# Patient Record
Sex: Female | Born: 1935
Health system: Southern US, Community
[De-identification: ages and names within clinical notes are randomized; demographics above are authoritative.]

## PROBLEM LIST (undated history)

## (undated) DIAGNOSIS — R7303 Prediabetes: Secondary | ICD-10-CM

## (undated) DIAGNOSIS — T4145XA Adverse effect of unspecified anesthetic, initial encounter: Secondary | ICD-10-CM

## (undated) DIAGNOSIS — R519 Headache, unspecified: Secondary | ICD-10-CM

## (undated) DIAGNOSIS — M109 Gout, unspecified: Secondary | ICD-10-CM

## (undated) DIAGNOSIS — Z972 Presence of dental prosthetic device (complete) (partial): Secondary | ICD-10-CM

## (undated) DIAGNOSIS — G7100 Muscular dystrophy, unspecified: Secondary | ICD-10-CM

## (undated) DIAGNOSIS — E785 Hyperlipidemia, unspecified: Secondary | ICD-10-CM

## (undated) DIAGNOSIS — J40 Bronchitis, not specified as acute or chronic: Secondary | ICD-10-CM

## (undated) DIAGNOSIS — I10 Essential (primary) hypertension: Secondary | ICD-10-CM

## (undated) DIAGNOSIS — I059 Rheumatic mitral valve disease, unspecified: Secondary | ICD-10-CM

## (undated) DIAGNOSIS — T8859XA Other complications of anesthesia, initial encounter: Secondary | ICD-10-CM

## (undated) DIAGNOSIS — G629 Polyneuropathy, unspecified: Secondary | ICD-10-CM

## (undated) DIAGNOSIS — I341 Nonrheumatic mitral (valve) prolapse: Secondary | ICD-10-CM

## (undated) DIAGNOSIS — I209 Angina pectoris, unspecified: Secondary | ICD-10-CM

## (undated) DIAGNOSIS — Z9181 History of falling: Secondary | ICD-10-CM

## (undated) DIAGNOSIS — M1990 Unspecified osteoarthritis, unspecified site: Secondary | ICD-10-CM

## (undated) HISTORY — PX: CARDIAC CATHETERIZATION: SHX172

## (undated) HISTORY — PX: TONSILLECTOMY: SUR1361

## (undated) HISTORY — PX: OTHER SURGICAL HISTORY: SHX169

## (undated) HISTORY — PX: BACK SURGERY: SHX140

## (undated) HISTORY — PX: EYE SURGERY: SHX253

## (undated) HISTORY — PX: TYMPANOSTOMY TUBE PLACEMENT: SHX32

## (undated) HISTORY — PX: ABDOMINAL HYSTERECTOMY: SHX81

## (undated) HISTORY — PX: CARPAL TUNNEL RELEASE: SHX101

## (undated) HISTORY — PX: BUNIONECTOMY: SHX129

## (undated) HISTORY — PX: BREAST SURGERY: SHX581

---
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
## 2004-08-19 ENCOUNTER — Ambulatory Visit: Payer: Self-pay | Admitting: Internal Medicine

## 2004-09-26 ENCOUNTER — Ambulatory Visit: Payer: Self-pay | Admitting: General Surgery

## 2004-11-13 ENCOUNTER — Ambulatory Visit: Payer: Self-pay | Admitting: Internal Medicine

## 2005-05-12 ENCOUNTER — Ambulatory Visit: Payer: Self-pay | Admitting: Internal Medicine

## 2005-08-22 ENCOUNTER — Ambulatory Visit: Payer: Self-pay | Admitting: Internal Medicine

## 2006-06-18 ENCOUNTER — Ambulatory Visit: Payer: Self-pay | Admitting: Cardiovascular Disease

## 2006-06-18 DIAGNOSIS — Z9889 Other specified postprocedural states: Secondary | ICD-10-CM | POA: Insufficient documentation

## 2006-06-20 ENCOUNTER — Emergency Department: Payer: Self-pay | Admitting: Emergency Medicine

## 2006-07-14 ENCOUNTER — Inpatient Hospital Stay (HOSPITAL_COMMUNITY): Admission: RE | Admit: 2006-07-14 | Discharge: 2006-07-16 | Payer: Self-pay | Admitting: Neurosurgery

## 2007-07-22 ENCOUNTER — Ambulatory Visit: Payer: Self-pay | Admitting: Internal Medicine

## 2008-02-10 DIAGNOSIS — I10 Essential (primary) hypertension: Secondary | ICD-10-CM

## 2008-02-29 ENCOUNTER — Ambulatory Visit: Payer: Self-pay | Admitting: Internal Medicine

## 2008-11-22 ENCOUNTER — Emergency Department: Payer: Self-pay | Admitting: Emergency Medicine

## 2009-03-06 ENCOUNTER — Ambulatory Visit: Payer: Self-pay | Admitting: Internal Medicine

## 2009-04-04 ENCOUNTER — Ambulatory Visit: Payer: Self-pay | Admitting: Gastroenterology

## 2009-04-10 ENCOUNTER — Ambulatory Visit: Payer: Self-pay | Admitting: Internal Medicine

## 2009-07-12 ENCOUNTER — Observation Stay: Payer: Self-pay | Admitting: Internal Medicine

## 2009-10-18 ENCOUNTER — Ambulatory Visit: Payer: Self-pay | Admitting: Internal Medicine

## 2009-11-19 ENCOUNTER — Ambulatory Visit: Payer: Self-pay | Admitting: Rheumatology

## 2009-12-31 ENCOUNTER — Ambulatory Visit: Payer: Self-pay | Admitting: Cardiology

## 2010-06-26 ENCOUNTER — Ambulatory Visit: Payer: Self-pay | Admitting: Internal Medicine

## 2010-12-09 DIAGNOSIS — R7309 Other abnormal glucose: Secondary | ICD-10-CM | POA: Insufficient documentation

## 2010-12-09 DIAGNOSIS — I10 Essential (primary) hypertension: Secondary | ICD-10-CM | POA: Insufficient documentation

## 2010-12-09 DIAGNOSIS — H269 Unspecified cataract: Secondary | ICD-10-CM | POA: Insufficient documentation

## 2010-12-09 DIAGNOSIS — I059 Rheumatic mitral valve disease, unspecified: Secondary | ICD-10-CM | POA: Insufficient documentation

## 2010-12-09 DIAGNOSIS — I341 Nonrheumatic mitral (valve) prolapse: Secondary | ICD-10-CM | POA: Insufficient documentation

## 2010-12-09 DIAGNOSIS — I340 Nonrheumatic mitral (valve) insufficiency: Secondary | ICD-10-CM | POA: Insufficient documentation

## 2010-12-09 DIAGNOSIS — G7109 Other specified muscular dystrophies: Secondary | ICD-10-CM | POA: Insufficient documentation

## 2012-01-06 ENCOUNTER — Ambulatory Visit: Payer: Self-pay | Admitting: Internal Medicine

## 2012-11-24 ENCOUNTER — Ambulatory Visit: Payer: Self-pay | Admitting: Ophthalmology

## 2012-11-24 LAB — POTASSIUM: Potassium: 4.3 mmol/L (ref 3.5–5.1)

## 2012-12-06 ENCOUNTER — Ambulatory Visit: Payer: Self-pay | Admitting: Ophthalmology

## 2012-12-06 HISTORY — PX: CATARACT EXTRACTION: SUR2

## 2013-03-17 ENCOUNTER — Other Ambulatory Visit: Payer: Self-pay | Admitting: Podiatry

## 2013-03-17 MED ORDER — OXYCODONE-ACETAMINOPHEN 5-325 MG PO TABS: ORAL_TABLET | ORAL | Status: DC

## 2013-03-17 MED ORDER — CEPHALEXIN 500 MG PO CAPS: 500.0000 mg | ORAL_CAPSULE | Freq: Three times a day (TID) | ORAL | Status: DC

## 2013-03-17 MED ORDER — PROMETHAZINE HCL 12.5 MG PO TABS: 25.0000 mg | ORAL_TABLET | Freq: Four times a day (QID) | ORAL | Status: DC | PRN

## 2013-03-18 ENCOUNTER — Encounter: Payer: Self-pay | Admitting: Podiatry

## 2013-03-18 DIAGNOSIS — M21619 Bunion of unspecified foot: Secondary | ICD-10-CM

## 2013-03-18 HISTORY — PX: OTHER SURGICAL HISTORY: SHX169

## 2013-03-21 ENCOUNTER — Encounter: Payer: Self-pay | Admitting: *Deleted

## 2013-03-24 ENCOUNTER — Ambulatory Visit (INDEPENDENT_AMBULATORY_CARE_PROVIDER_SITE_OTHER): Payer: 59

## 2013-03-24 ENCOUNTER — Encounter: Payer: Self-pay | Admitting: Podiatry

## 2013-03-24 ENCOUNTER — Ambulatory Visit (INDEPENDENT_AMBULATORY_CARE_PROVIDER_SITE_OTHER): Payer: 59 | Admitting: Podiatry

## 2013-03-24 VITALS — BP 127/78 | HR 65 | Temp 97.0°F | Resp 16 | Ht 67.0 in | Wt 160.0 lb

## 2013-03-24 DIAGNOSIS — M21619 Bunion of unspecified foot: Secondary | ICD-10-CM

## 2013-03-24 DIAGNOSIS — M21622 Bunionette of left foot: Secondary | ICD-10-CM

## 2013-03-24 NOTE — Progress Notes (Signed)
Ms. Byer presents today one week status post fifth metatarsal osteotomy with screw. She denies fever chills nausea vomiting muscle aches or pains. States it is doing quite well. No problems with the Cam Walker.  Objective: Vital signs are stable she is alert and oriented x3. Dry sterile dressing was intact. Once removed demonstrates no erythema no edema cellulitis drainage or odor. Radiographic evaluation demonstrates capital fragment in good position with screw fixation. Sutures are in place margins appear to be well coapted.  Assessment: Well-healing surgical foot left status post fifth metatarsal osteotomy.  Plan: Redressed foot today with a dry sterile compressive dressing she will followup with myself or Dr. Donzetta Kohut in one week.

## 2013-03-31 ENCOUNTER — Encounter: Payer: 59 | Admitting: Podiatrist

## 2013-04-04 ENCOUNTER — Encounter: Payer: 59 | Admitting: Podiatry

## 2013-04-06 ENCOUNTER — Encounter: Payer: Self-pay | Admitting: Podiatry

## 2013-04-06 ENCOUNTER — Ambulatory Visit (INDEPENDENT_AMBULATORY_CARE_PROVIDER_SITE_OTHER): Payer: 59 | Admitting: Podiatry

## 2013-04-06 VITALS — BP 136/79 | HR 64 | Resp 16 | Ht 67.0 in | Wt 160.0 lb

## 2013-04-06 DIAGNOSIS — M205X2 Other deformities of toe(s) (acquired), left foot: Secondary | ICD-10-CM

## 2013-04-06 DIAGNOSIS — M21619 Bunion of unspecified foot: Secondary | ICD-10-CM

## 2013-04-06 NOTE — Progress Notes (Signed)
Diana Sullivan presents today 3 weeks status post fifth metatarsal osteotomy. She states that has a stinging in my toe. And she's concerned about the swelling to the lateral aspect of the foot.  Objective: We have reviewed her past medical history medications and allergies. Vital signs are stable she is alert and oriented x3. She has mild tenderness on palpation of the fifth metatarsal at the surgical site. Margins are well coapted sutures are intact. Sutures were removed today and margins remain well coapted and there is no signs of infection. We reviewed old radiographs as well as her initial postop visit radiograph demonstrating to her that we had a very nice correction.  Assessment: Well-healing surgical fifth metatarsal osteotomy left.  Plan: Will allow her to start getting this wet. I also put her in a compression anklet as well as a Darco shoe. We'll followup with her in 2 weeks for another set of x-rays.

## 2013-04-20 ENCOUNTER — Ambulatory Visit (INDEPENDENT_AMBULATORY_CARE_PROVIDER_SITE_OTHER): Payer: 59

## 2013-04-20 ENCOUNTER — Ambulatory Visit (INDEPENDENT_AMBULATORY_CARE_PROVIDER_SITE_OTHER): Payer: 59 | Admitting: Podiatry

## 2013-04-20 ENCOUNTER — Encounter: Payer: Self-pay | Admitting: Podiatry

## 2013-04-20 VITALS — BP 120/68 | HR 67 | Resp 16 | Ht 67.0 in | Wt 160.0 lb

## 2013-04-20 DIAGNOSIS — M79609 Pain in unspecified limb: Secondary | ICD-10-CM

## 2013-04-20 DIAGNOSIS — M79672 Pain in left foot: Secondary | ICD-10-CM

## 2013-04-20 DIAGNOSIS — Z9889 Other specified postprocedural states: Secondary | ICD-10-CM

## 2013-04-20 NOTE — Progress Notes (Signed)
Diana Sullivan presents today for followup of her fifth metatarsal osteotomy left foot. Date of surgery was 03/18/2013. She states it is doing pretty well but is tender at times.  Objective: Vital signs are stable she is alert and oriented x3. Mild edema on physical evaluation of the fifth metatarsal area left foot mild tenderness on palpation. Radiographic evaluation demonstrates well-healing fifth metatarsal osteotomy with screw fixation fifth left.  Assessment: Status post fifth metatarsal osteotomy 03/18/2013. Left foot.  Plan: Continue to use the Darco shoe for the next week or so then try to transition to a regular shoe of the next him I see her in 3 weeks.

## 2013-05-02 ENCOUNTER — Encounter: Payer: Self-pay | Admitting: Podiatry

## 2013-05-02 ENCOUNTER — Ambulatory Visit (INDEPENDENT_AMBULATORY_CARE_PROVIDER_SITE_OTHER): Payer: 59

## 2013-05-02 ENCOUNTER — Ambulatory Visit (INDEPENDENT_AMBULATORY_CARE_PROVIDER_SITE_OTHER): Payer: 59 | Admitting: Podiatry

## 2013-05-02 VITALS — BP 121/76 | HR 66 | Resp 16 | Ht 67.0 in | Wt 160.0 lb

## 2013-05-02 DIAGNOSIS — Z9889 Other specified postprocedural states: Secondary | ICD-10-CM

## 2013-05-02 DIAGNOSIS — M79672 Pain in left foot: Secondary | ICD-10-CM

## 2013-05-02 DIAGNOSIS — M79609 Pain in unspecified limb: Secondary | ICD-10-CM

## 2013-05-02 NOTE — Progress Notes (Signed)
Diana Sullivan presents today with a chief complaint of pain to the dorsal medial aspect of her left foot. She has recently had surgery regarding her fifth metatarsal osteotomy with screw fixation she was last seen a week or so ago which is doing quite well with this. Radiographs are taken that time demonstrating well coapted and in good screw with a healing fifth metatarsal. Today she's complaining of pain to the tibialis anterior at its insertion site. Radiographs do not demonstrate any type of avulsion and her tendon appears to be full and normal. Does not appear to have fluid collection and there is no ecchymosis in the area of physical exam redressed do not demonstrate any type of osseous abnormalities. She does have some distraction to the osteotomy the fifth metatarsal of the left foot secondary to this injury.   Assessment: Tibialis anterior tendinitis secondary to trauma left. Mild dislocation fifth metatarsal osteotomy.  Plan: I encouraged her to reapply her Cam Walker and I will followup with her in 2-3 weeks. I also encouraged ice therapy.

## 2013-05-18 ENCOUNTER — Encounter: Payer: Self-pay | Admitting: Podiatry

## 2013-05-18 ENCOUNTER — Ambulatory Visit (INDEPENDENT_AMBULATORY_CARE_PROVIDER_SITE_OTHER): Payer: 59

## 2013-05-18 ENCOUNTER — Ambulatory Visit (INDEPENDENT_AMBULATORY_CARE_PROVIDER_SITE_OTHER): Payer: 59 | Admitting: Podiatry

## 2013-05-18 VITALS — BP 127/73 | HR 61 | Resp 16

## 2013-05-18 DIAGNOSIS — Z9889 Other specified postprocedural states: Secondary | ICD-10-CM

## 2013-05-18 NOTE — Progress Notes (Signed)
   Subjective:    Patient ID: Diana Sullivan, female    DOB: 01/03/1936, 77 y.o.   MRN: [id number]  HPI Comments: " its hurting and burning along the side of my foot " even the sheet bothers it      Review of Systems     Objective:   Physical Exam: Vital signs are stable she is alert and oriented x3. Pulses remain palpable left foot. Still has tenderness on palpation of the fifth metatarsal osteotomy however the edema appears to be regressing. Radiographic evaluation still demonstrates nonunion fifth metatarsal osteotomy secondary to injuring the foot following surgery.        Assessment & Plan:  Assessment: Delayed union fifth metatarsal osteotomy left foot.  Plan: Continue use of the Darco shoe. Massage therapy and hot and cold contrast baths for continued pain. I will followup with her in the near future. For another set of x-rays

## 2013-06-15 ENCOUNTER — Ambulatory Visit: Payer: 59 | Admitting: Podiatry

## 2013-06-20 NOTE — Progress Notes (Signed)
1. 5TH METATARSAL OSTEOTOMY WITH SCREWS LEFT FOOT

## 2013-07-25 ENCOUNTER — Ambulatory Visit (INDEPENDENT_AMBULATORY_CARE_PROVIDER_SITE_OTHER): Payer: 59 | Admitting: Podiatry

## 2013-07-25 ENCOUNTER — Ambulatory Visit (INDEPENDENT_AMBULATORY_CARE_PROVIDER_SITE_OTHER): Payer: 59

## 2013-07-25 ENCOUNTER — Encounter: Payer: Self-pay | Admitting: Podiatry

## 2013-07-25 VITALS — BP 131/85 | HR 65 | Resp 16

## 2013-07-25 DIAGNOSIS — Z9889 Other specified postprocedural states: Secondary | ICD-10-CM

## 2013-07-25 NOTE — Progress Notes (Signed)
Post op 10.10.14 , left foot doing well there she states she did not soak it in the contrast baths.  Objective: Vital signs are stable she is alert and oriented x3. There is no erythema mild edema saline is drainage or odor to surgical site fifth metatarsal head left foot. Radiographic evaluation does demonstrate some early bridging of the osteotomy site. This delayed union/malunion should going to heal uneventfully.  Assessment: Delayed union fifth metatarsal status post fifth met osteotomy left foot 03/18/2013.  Plan: Followup with her on an as-needed basis.

## 2013-08-30 ENCOUNTER — Ambulatory Visit: Payer: Self-pay | Admitting: Rheumatology

## 2013-09-03 DIAGNOSIS — Z789 Other specified health status: Secondary | ICD-10-CM | POA: Insufficient documentation

## 2013-09-03 DIAGNOSIS — E785 Hyperlipidemia, unspecified: Secondary | ICD-10-CM | POA: Insufficient documentation

## 2013-11-16 DIAGNOSIS — M19079 Primary osteoarthritis, unspecified ankle and foot: Secondary | ICD-10-CM | POA: Insufficient documentation

## 2014-01-30 DIAGNOSIS — R079 Chest pain, unspecified: Secondary | ICD-10-CM | POA: Insufficient documentation

## 2014-02-03 ENCOUNTER — Ambulatory Visit: Payer: Self-pay | Admitting: Internal Medicine

## 2014-05-15 DIAGNOSIS — M19072 Primary osteoarthritis, left ankle and foot: Secondary | ICD-10-CM | POA: Insufficient documentation

## 2014-09-29 NOTE — Op Note (Signed)
PATIENT NAME:  Diana Sullivan, Diana Sullivan MR#:  [id number] DATE OF BIRTH:  Jan 10, 1936  DATE OF PROCEDURE:  12/06/2012  PREOPERATIVE DIAGNOSIS: Cataract , left eye.   PROCEDURE PERFORMED: Extracapsular cataract extraction using phacoemulsification with placement Alcon  [id number], 19.5 diopter posterior chamber lens, serial number [id number].   SURGEON: Loura Back. Zarina Pe, M.D.   ANESTHESIA: 4% lidocaine and 0.75% Marcaine a 50-50 mixture with 10 units/mL of Hylenex added, given as a peribulbar.   ANESTHESIOLOGIST: Dr. Kayleen Memos.   COMPLICATIONS: None.   ESTIMATED BLOOD LOSS: Less than 1 mL.   DESCRIPTION OF PROCEDURE:  The patient was brought to the operating room and given a peribulbar block.  The patient was then prepped and draped in the usual fashion.  The vertical rectus muscles were imbricated using 5-0 silk sutures.  These sutures were then clamped to the sterile drapes as bridle sutures.  A limbal peritomy was performed extending two clock hours and hemostasis was obtained with cautery.  A partial thickness scleral groove was made at the surgical limbus and dissected anteriorly in a lamellar dissection using an Alcon crescent knife.  The anterior chamber was entered supero-temporally with a Superblade and through the lamellar dissection with a 2.6 mm keratome.  DisCoVisc was used to replace the aqueous and a continuous tear capsulorrhexis was carried out.  Hydrodissection and hydrodelineation were carried out with balanced salt and a 27 gauge canula.  The nucleus was rotated to confirm the effectiveness of the hydrodissection.  Phacoemulsification was carried out using a divide-and-conquer technique.  Total ultrasound time was 52 seconds with an average power of 21.7 percent.  CDE of 22.21.    Irrigation/aspiration was used to remove the residual cortex.  DisCoVisc was used to inflate the capsule and the internal incision was enlarged to 3 mm with the crescent knife.  The intraocular lens was  folded and inserted into the capsular bag using the AcrySert delivery system.  Irrigation/aspiration was used to remove the residual DisCoVisc.   Miostat was injected into the anterior chamber through the paracentesis track to inflate the anterior chamber and induce miosis. One-tenth of a milliliter with cefuroxime was injected through the paracentesis.   The wound was checked for leaks and none were found. The conjunctiva was closed with cautery and the bridle sutures were removed.  Two drops of 0.3% Vigamox were placed on the eye.   An eye shield was placed on the eye.  The patient was discharged to the recovery room in good condition.     ____________________________ Loura Back Frederick Marro, MD sad:cc D: 12/06/2012 13:57:19 ET T: 12/06/2012 14:04:41 ET JOB#: [id number]  cc: Remo Lipps A. Raquel Racey, MD, <Dictator> Martie Lee MD ELECTRONICALLY SIGNED 12/13/2012 13:24

## 2016-04-03 ENCOUNTER — Ambulatory Visit: Payer: Self-pay | Admitting: Podiatry

## 2016-04-14 ENCOUNTER — Encounter: Payer: Self-pay | Admitting: *Deleted

## 2016-04-14 ENCOUNTER — Other Ambulatory Visit: Payer: Self-pay | Admitting: *Deleted

## 2016-04-14 ENCOUNTER — Ambulatory Visit (INDEPENDENT_AMBULATORY_CARE_PROVIDER_SITE_OTHER): Payer: Medicare Other | Admitting: Podiatry

## 2016-04-14 ENCOUNTER — Encounter: Payer: Self-pay | Admitting: Podiatry

## 2016-04-14 ENCOUNTER — Ambulatory Visit (INDEPENDENT_AMBULATORY_CARE_PROVIDER_SITE_OTHER): Payer: Medicare Other

## 2016-04-14 VITALS — BP 136/78 | HR 53 | Resp 16

## 2016-04-14 DIAGNOSIS — M79672 Pain in left foot: Secondary | ICD-10-CM

## 2016-04-14 DIAGNOSIS — M79671 Pain in right foot: Secondary | ICD-10-CM | POA: Diagnosis not present

## 2016-04-14 DIAGNOSIS — T847XXA Infection and inflammatory reaction due to other internal orthopedic prosthetic devices, implants and grafts, initial encounter: Secondary | ICD-10-CM

## 2016-04-14 NOTE — Patient Instructions (Signed)

## 2016-04-14 NOTE — Progress Notes (Signed)
She presents today for a chief complaint of a painful hallux left foot. An Akin osteotomy was performed there many years ago and she would like to have this screw removed. After that procedure her toe sat up and developed a painful callus beneath the first metatarsophalangeal joint. She states that this is very painful to walk on and she can deathly not walking barefooted. She like to be able to walk normally.  Objective: Vital signs are stable alert and oriented 3 have reviewed her past medical history medications allergy surgeries and social history. Pulses are strongly palpable and neurologic sensorium is intact. Deep tendon reflexes are intact. Muscle tone is normal. Orthopedic evaluation demonstrates contracted metatarsophalangeal joint first left with mallet toe deformity. Radiograph does demonstrate considerable midfoot osteoarthritis with multiple areas of nonunion and plates and screws. One screw was retained not only to the fifth metatarsal but also to the proximal phalanx.  Assessment: Painful contracted metatarsophalangeal joint with callus plantarly. Painful internal fixation.  Plan: He consented her today for a hallux interphalangeal joint fusion removal internal fixation and a release of the first metatarsophalangeal joint with reattachment of the extensor tendon. We discussed the possible postop complications she understands this is amenable to inside-out 3 pages of the consent form.

## 2016-04-18 ENCOUNTER — Encounter: Payer: Self-pay | Admitting: *Deleted

## 2016-04-21 ENCOUNTER — Encounter
Admission: RE | Disposition: A | Discharge status: Home / Self Care | Payer: Self-pay | Source: Ambulatory Visit | Attending: Unknown Physician Specialty

## 2016-04-21 ENCOUNTER — Ambulatory Visit: Payer: Medicare Other | Admitting: Anesthesiology

## 2016-04-21 ENCOUNTER — Encounter: Payer: Self-pay | Admitting: *Deleted

## 2016-04-21 ENCOUNTER — Ambulatory Visit
Admission: RE | Admit: 2016-04-21 | Discharge: 2016-04-21 | Disposition: A | Discharge status: Home / Self Care | Payer: Medicare Other | Source: Ambulatory Visit | Attending: Unknown Physician Specialty | Admitting: Unknown Physician Specialty

## 2016-04-21 DIAGNOSIS — Z79899 Other long term (current) drug therapy: Secondary | ICD-10-CM | POA: Diagnosis not present

## 2016-04-21 DIAGNOSIS — Z7982 Long term (current) use of aspirin: Secondary | ICD-10-CM | POA: Insufficient documentation

## 2016-04-21 DIAGNOSIS — Z87891 Personal history of nicotine dependence: Secondary | ICD-10-CM | POA: Insufficient documentation

## 2016-04-21 DIAGNOSIS — D120 Benign neoplasm of cecum: Secondary | ICD-10-CM | POA: Diagnosis not present

## 2016-04-21 DIAGNOSIS — R7303 Prediabetes: Secondary | ICD-10-CM | POA: Diagnosis not present

## 2016-04-21 DIAGNOSIS — Z86010 Personal history of colonic polyps: Secondary | ICD-10-CM | POA: Diagnosis not present

## 2016-04-21 DIAGNOSIS — G709 Myoneural disorder, unspecified: Secondary | ICD-10-CM | POA: Insufficient documentation

## 2016-04-21 DIAGNOSIS — K635 Polyp of colon: Secondary | ICD-10-CM | POA: Insufficient documentation

## 2016-04-21 DIAGNOSIS — G710 Muscular dystrophy: Secondary | ICD-10-CM | POA: Insufficient documentation

## 2016-04-21 DIAGNOSIS — D123 Benign neoplasm of transverse colon: Secondary | ICD-10-CM | POA: Diagnosis not present

## 2016-04-21 DIAGNOSIS — K640 First degree hemorrhoids: Secondary | ICD-10-CM | POA: Insufficient documentation

## 2016-04-21 DIAGNOSIS — M1990 Unspecified osteoarthritis, unspecified site: Secondary | ICD-10-CM | POA: Insufficient documentation

## 2016-04-21 DIAGNOSIS — Z1211 Encounter for screening for malignant neoplasm of colon: Secondary | ICD-10-CM | POA: Diagnosis not present

## 2016-04-21 DIAGNOSIS — I10 Essential (primary) hypertension: Secondary | ICD-10-CM | POA: Insufficient documentation

## 2016-04-21 HISTORY — PX: COLONOSCOPY: SHX5424

## 2016-04-21 HISTORY — DX: Essential (primary) hypertension: I10

## 2016-04-21 HISTORY — DX: Rheumatic mitral valve disease, unspecified: I05.9

## 2016-04-21 HISTORY — DX: Muscular dystrophy, unspecified: G71.00

## 2016-04-21 HISTORY — DX: Hyperlipidemia, unspecified: E78.5

## 2016-04-21 HISTORY — DX: Prediabetes: R73.03

## 2016-04-21 SURGERY — COLONOSCOPY
Anesthesia: General

## 2016-04-21 MED ORDER — SODIUM CHLORIDE 0.9 % IV SOLN
INTRAVENOUS | Status: DC
  Administered 2016-04-21: 1000 mL via INTRAVENOUS

## 2016-04-21 MED ORDER — LIDOCAINE HCL (PF) 2 % IJ SOLN
INTRAMUSCULAR | Status: DC | PRN
  Administered 2016-04-21: 50 mg

## 2016-04-21 MED ORDER — PROPOFOL 10 MG/ML IV BOLUS
INTRAVENOUS | Status: DC | PRN
  Administered 2016-04-21: 30 mg via INTRAVENOUS
  Administered 2016-04-21: 20 mg via INTRAVENOUS

## 2016-04-21 MED ORDER — PROPOFOL 500 MG/50ML IV EMUL
INTRAVENOUS | Status: DC | PRN
  Administered 2016-04-21: 75 ug/kg/min via INTRAVENOUS

## 2016-04-21 MED ORDER — FENTANYL CITRATE (PF) 100 MCG/2ML IJ SOLN
INTRAMUSCULAR | Status: DC | PRN
Start: 2016-04-21 — End: 2016-04-21
  Administered 2016-04-21: 50 ug via INTRAVENOUS

## 2016-04-21 MED ORDER — EPHEDRINE SULFATE 50 MG/ML IJ SOLN
INTRAMUSCULAR | Status: DC | PRN
  Administered 2016-04-21: 10 mg via INTRAVENOUS

## 2016-04-21 NOTE — Transfer of Care (Signed)
Immediate Anesthesia Transfer of Care Note  Patient: Diana Sullivan  Procedure(s) Performed: Procedure(s) with comments: COLONOSCOPY (N/A) - Diabetic  Patient Location: PACU  Anesthesia Type:General  Level of Consciousness: sedated  Airway & Oxygen Therapy: Patient Spontanous Breathing and Patient connected to nasal cannula oxygen  Post-op Assessment: Report given to RN and Post -op Vital signs reviewed and stable  Post vital signs: Reviewed and stable  Last Vitals:  Vitals:   04/21/16 0702 04/21/16 0810  BP: 137/90 (!) (P) 93/54  Pulse: 68 (P) 70  Resp: 16 (P) 16  Temp: 36.4 C (P) 36.3 C    Last Pain:  Vitals:   04/21/16 0810  TempSrc: (P) Tympanic         Complications: No apparent anesthesia complications

## 2016-04-21 NOTE — H&P (Signed)
Primary Care Physician:  Volanda Napoleon, MD Primary Gastroenterologist:  Dr. Vira Agar  Pre-Procedure History & Physical: HPI:  Diana Sullivan is a 80 y.o. female is here for an colonoscopy.   Past Medical History:  Diagnosis Date  . Elevated lipids   . Hypertension    essential hypertension  . Mitral valve disorder   . Muscular dystrophy ([place])   . Pre-diabetes     Past Surgical History:  Procedure Laterality Date  . ABDOMINAL HYSTERECTOMY    . adenectomy    . BACK SURGERY    . BREAST SURGERY    . BUNIONECTOMY    . CARDIAC CATHETERIZATION    . EYE SURGERY    . metatarsal osteotomy Left 03-18-2013  . TONSILLECTOMY    . TYMPANOSTOMY TUBE PLACEMENT      Prior to Admission medications   Medication Sig Start Date End Date Taking? Authorizing Provider  METOPROLOL SUCCINATE ER PO Take 50 mg by mouth.   Yes Historical Provider, MD  aspirin 81 MG tablet 81 mg. 2 - 3 times a week    Historical Provider, MD  Blood Glucose Monitoring Suppl (ONE TOUCH ULTRA 2) w/Device KIT  04/07/16   Historical Provider, MD  EPINEPHrine 0.3 mg/0.3 mL IJ SOAJ injection Reported on 05/30/2015 03/06/15   Historical Provider, MD  Hubbard 420 g solution  01/18/16   Historical Provider, MD  meloxicam (MOBIC) 15 MG tablet  06/14/13   Historical Provider, MD  Omega-3 Fatty Acids (FISH OIL PO) Take by mouth.    Historical Provider, MD  ONE TOUCH ULTRA TEST test strip  04/07/16   Historical Provider, MD  valsartan-hydrochlorothiazide (DIOVAN-HCT) 160-12.5 MG per tablet Take 1 tablet by mouth daily.    Historical Provider, MD    Allergies as of 02/25/2016  . (No Known Allergies)    History reviewed. No pertinent family history.  Social History   Social History  . Marital status: Divorced    Spouse name: N/A  . Number of children: N/A  . Years of education: N/A   Occupational History  . Not on file.   Social History Main Topics  . Smoking status: Former Research scientist (life sciences)  .  Smokeless tobacco: Never Used     Comment: QUIT 25 YEARS AGO  . Alcohol use Yes     Comment: SOCIAL  . Drug use: Unknown  . Sexual activity: Not on file   Other Topics Concern  . Not on file   Social History Narrative  . No narrative on file    Review of Systems: See HPI, otherwise negative ROS  Physical Exam: BP 137/90   Pulse 68   Temp 97.5 F (36.4 C) (Tympanic)   Resp 16   Ht '5\' 8"'  (1.727 m)   Wt 75.3 kg (166 lb)   SpO2 98%   BMI 25.24 kg/m  General:   Alert,  pleasant and cooperative in NAD Head:  Normocephalic and atraumatic. Neck:  Supple; no masses or thyromegaly. Lungs:  Clear throughout to auscultation.    Heart:  Regular rate and rhythm. Abdomen:  Soft, nontender and nondistended. Normal bowel sounds, without guarding, and without rebound.   Neurologic:  Alert and  oriented x4;  grossly normal neurologically.  Impression/Plan: Diana Sullivan is here for an colonoscopy to be performed for [name] colon polyps  Risks, benefits, limitations, and alternatives regarding  colonoscopy have been reviewed with the patient.  Questions have been answered.  All parties agreeable.   Diana Sullivan,  Herbie Baltimore, MD  04/21/2016, 7:31 AM

## 2016-04-21 NOTE — Op Note (Signed)
[facility] Gastroenterology Patient Name: Diana Sullivan Procedure Date: 04/21/2016 7:33 AM MRN: [id number] Account #: [id number] Date of Birth: 13-May-1936 Admit Type: Outpatient Age: 80 Room: [facility] ENDO ROOM 4 Gender: Female Note Status: Finalized Procedure:            Colonoscopy Indications:          High risk colon cancer surveillance: Personal history                        of colonic polyps Providers:            Manya Silvas, MD Referring MD:         Venetia Maxon. Elijio Miles, MD (Referring MD) Medicines:            Propofol per Anesthesia Complications:        No immediate complications. Procedure:            Pre-Anesthesia Assessment:                       - After reviewing the risks and benefits, the patient                        was deemed in satisfactory condition to undergo the                        procedure.                       After obtaining informed consent, the colonoscope was                        passed under direct vision. Throughout the procedure,                        the patient's blood pressure, pulse, and oxygen                        saturations were monitored continuously. The                        Colonoscope was introduced through the anus and                        advanced to the the cecum, identified by appendiceal                        orifice and ileocecal valve. The colonoscopy was                        performed without difficulty. The patient tolerated the                        procedure well. The quality of the bowel preparation                        was excellent. Findings:      Five sessile polyps were found in the sigmoid colon, descending colon,       hepatic flexure and cecum. The polyps were diminutive in size. These       polyps were removed with a jumbo cold forceps. Resection and retrieval  were complete.      Internal hemorrhoids were found during endoscopy. The hemorrhoids were       small and  Grade I (internal hemorrhoids that do not prolapse).      The exam was otherwise without abnormality. Impression:           - Five diminutive polyps in the sigmoid colon, in the                        descending colon, at the hepatic flexure and in the                        cecum, removed with a jumbo cold forceps. Resected and                        retrieved.                       - Internal hemorrhoids.                       - The examination was otherwise normal. Recommendation:       - Await pathology results. Manya Silvas, MD 04/21/2016 8:09:58 AM This report has been signed electronically. Number of Addenda: 0 Note Initiated On: 04/21/2016 7:33 AM Scope Withdrawal Time: 0 hours 18 minutes 29 seconds  Total Procedure Duration: 0 hours 28 minutes 40 seconds       [facility]

## 2016-04-21 NOTE — Anesthesia Postprocedure Evaluation (Signed)
Anesthesia Post Note  Patient: Diana Sullivan  Procedure(s) Performed: Procedure(s) (LRB): COLONOSCOPY (N/A)  Patient location during evaluation: Endoscopy Anesthesia Type: General Level of consciousness: awake and alert Pain management: pain level controlled Vital Signs Assessment: post-procedure vital signs reviewed and stable Respiratory status: spontaneous breathing, nonlabored ventilation, respiratory function stable and patient connected to nasal cannula oxygen Cardiovascular status: blood pressure returned to baseline and stable Postop Assessment: no signs of nausea or vomiting Anesthetic complications: no    Last Vitals:  Vitals:   04/21/16 0820 04/21/16 0830  BP: 130/77 134/84  Pulse:    Resp:    Temp:      Last Pain:  Vitals:   04/21/16 0810  TempSrc: Tympanic                 Mack Alvidrez S

## 2016-04-21 NOTE — Anesthesia Preprocedure Evaluation (Addendum)
Anesthesia Evaluation  Patient identified by MRN, date of birth, ID band Patient awake    Reviewed: Allergy & Precautions, NPO status , Patient's Chart, lab work & pertinent test results, reviewed documented beta blocker date and time   Airway Mallampati: II  TM Distance: >3 FB     Dental  (+) Chipped, Partial Lower, Partial Upper   Pulmonary former smoker,           Cardiovascular hypertension, Pt. on medications and Pt. on home beta blockers      Neuro/Psych  Neuromuscular disease    GI/Hepatic   Endo/Other    Renal/GU      Musculoskeletal  (+) Arthritis ,   Abdominal   Peds  Hematology   Anesthesia Other Findings   Reproductive/Obstetrics                            Anesthesia Physical Anesthesia Plan  ASA: III  Anesthesia Plan: General   Post-op Pain Management:    Induction:   Airway Management Planned: Nasal Cannula  Additional Equipment:   Intra-op Plan:   Post-operative Plan:   Informed Consent: I have reviewed the patients History and Physical, chart, labs and discussed the procedure including the risks, benefits and alternatives for the proposed anesthesia with the patient or authorized representative who has indicated his/her understanding and acceptance.     Plan Discussed with: CRNA  Anesthesia Plan Comments:         Anesthesia Quick Evaluation

## 2016-04-22 ENCOUNTER — Encounter: Payer: Self-pay | Admitting: Unknown Physician Specialty

## 2016-04-23 LAB — SURGICAL PATHOLOGY

## 2016-10-01 DIAGNOSIS — M159 Polyosteoarthritis, unspecified: Secondary | ICD-10-CM | POA: Insufficient documentation

## 2016-10-01 DIAGNOSIS — M25551 Pain in right hip: Secondary | ICD-10-CM | POA: Insufficient documentation

## 2016-10-01 DIAGNOSIS — G8929 Other chronic pain: Secondary | ICD-10-CM | POA: Insufficient documentation

## 2016-10-01 DIAGNOSIS — M65342 Trigger finger, left ring finger: Secondary | ICD-10-CM | POA: Insufficient documentation

## 2017-07-20 ENCOUNTER — Telehealth: Payer: Self-pay | Admitting: *Deleted

## 2017-07-20 NOTE — Telephone Encounter (Signed)
Dr. Jacqualyn Posey states since pt is [place] have go to [place] today or see Dr. Amalia Hailey tomorrow. I informed pt and she states she can come in tomorrow. I transferred pt to schedulers.

## 2017-07-20 NOTE — Telephone Encounter (Signed)
Please have her come in today.

## 2017-07-20 NOTE — Telephone Encounter (Signed)
Pt states she had surgery with Dr. Milinda Pointer a couple of years ago and a few days ago the foot with the rod became red, swollen and painful and she wanted to know if she could apply ice, she is currently resting until her appt 07/22/2017.

## 2017-07-21 ENCOUNTER — Ambulatory Visit: Payer: Medicare Other | Admitting: Podiatry

## 2017-07-21 ENCOUNTER — Ambulatory Visit (INDEPENDENT_AMBULATORY_CARE_PROVIDER_SITE_OTHER): Payer: Medicare Other

## 2017-07-21 DIAGNOSIS — IMO0001 Reserved for inherently not codable concepts without codable children: Secondary | ICD-10-CM

## 2017-07-21 DIAGNOSIS — M10071 Idiopathic gout, right ankle and foot: Secondary | ICD-10-CM

## 2017-07-21 DIAGNOSIS — T8579XA Infection and inflammatory reaction due to other internal prosthetic devices, implants and grafts, initial encounter: Secondary | ICD-10-CM

## 2017-07-21 MED ORDER — COLCHICINE 0.6 MG PO TABS: 0.6000 mg | ORAL_TABLET | Freq: Every day | ORAL | 0 refills | Status: DC

## 2017-07-22 ENCOUNTER — Ambulatory Visit: Payer: Medicare Other | Admitting: Podiatry

## 2017-07-22 NOTE — Progress Notes (Signed)
   HPI: 82 year old female presenting today with a chief complaint of pain to the 1st MPJ of the right foot that began three days ago. She reports associated redness, swelling and warmth of the joint. She has not done anything to treat the symptoms. Touching the area, bearing weight and walking increases the pain. She denies alleviating factors. She denies trauma or injury. Patient is here for further evaluation and treatment.   Past Medical History:  Diagnosis Date  . Elevated lipids   . Hypertension    essential hypertension  . Mitral valve disorder   . Muscular dystrophy ([place])   . Pre-diabetes      Physical Exam: General: The patient is alert and oriented x3 in no acute distress.  Dermatology: Skin is warm, dry and supple bilateral lower extremities. Negative for open lesions or macerations.  Vascular: Erythema and edema noted to the 1st MPJ of the right foot. Palpable pedal pulses bilaterally. Capillary refill within normal limits.  Neurological: Epicritic and protective threshold grossly intact bilaterally.   Musculoskeletal Exam: Pain with palpation to the 1st MPJ of the right foot. Range of motion within normal limits to all pedal and ankle joints bilateral. Muscle strength 5/5 in all groups bilateral.   Radiographic Exam:  Normal osseous mineralization. Joint spaces preserved. No fracture/dislocation/boney destruction.    Assessment: - Acute gout right 1st MPJ   Plan of Care:  - Patient evaluated. X-Rays reviewed.  - Injection of 0.5 mLs Celestone Soluspan injected into the 1st MPJ of the right foot.  - Prescription for Colcrys 0.6 mg provided to patient.  - Return to clinic in 4 weeks.    Edrick Kins, DPM [facility]  Dr. Edrick Kins, DPM    [address], [place] [address]                Office [phone]  Fax [phone]

## 2017-07-28 DIAGNOSIS — R0683 Snoring: Secondary | ICD-10-CM | POA: Insufficient documentation

## 2017-07-28 DIAGNOSIS — R531 Weakness: Secondary | ICD-10-CM | POA: Insufficient documentation

## 2017-08-18 ENCOUNTER — Ambulatory Visit: Payer: Medicare Other | Admitting: Podiatry

## 2017-12-09 ENCOUNTER — Ambulatory Visit: Payer: Medicare Other | Admitting: Podiatry

## 2017-12-14 DIAGNOSIS — G4733 Obstructive sleep apnea (adult) (pediatric): Secondary | ICD-10-CM | POA: Insufficient documentation

## 2018-01-04 ENCOUNTER — Encounter: Payer: Self-pay | Admitting: Podiatry

## 2018-01-04 ENCOUNTER — Ambulatory Visit: Payer: Medicare Other | Admitting: Podiatry

## 2018-01-04 DIAGNOSIS — L600 Ingrowing nail: Secondary | ICD-10-CM

## 2018-01-04 MED ORDER — NEOMYCIN-POLYMYXIN-HC 1 % OT SOLN: OTIC | 1 refills | Status: DC

## 2018-01-04 NOTE — Progress Notes (Signed)
She presents today states that she has an ingrown toenail to the hallux left.  She states is been bothering her for quite some time.  Objective: Vital signs are stable she is alert and oriented x3 there is no erythema just some mild edema she has pain on palpation no purulence no malodor sharp incurvated nail margin tibial border hallux left.  Assessment: Ingrown nail hallux left.  Plan: I have performed a chemical matrixectomy today after local anesthetic was achieved.  Tolerated procedure well without complications.  She was given both oral and written home-going instruction for the care and soaking of her foot.  Follow-up with me in 2 weeks prescribed Cortisporin Otic to be applied twice daily after soaking.

## 2018-01-04 NOTE — Patient Instructions (Addendum)

## 2018-01-05 MED ORDER — NEOMYCIN-POLYMYXIN-HC 1 % OT SOLN: OTIC | 1 refills | Status: DC

## 2018-01-05 NOTE — Addendum Note (Signed)
Addended by: Graceann Congress D on: 01/05/2018 08:49 AM   Modules accepted: Orders

## 2018-01-25 ENCOUNTER — Ambulatory Visit: Payer: Medicare Other | Admitting: Podiatry

## 2018-01-25 ENCOUNTER — Encounter: Payer: Self-pay | Admitting: Podiatry

## 2018-01-25 DIAGNOSIS — L600 Ingrowing nail: Secondary | ICD-10-CM

## 2018-01-25 NOTE — Progress Notes (Signed)
She presents today for follow-up of her matrixectomy hallux left.  She states that is doing a lot better still little bit tender and bleeds occasionally from the corner.  Objective: Vital signs are stable she is alert and oriented and oriented x3 continues to soak on a daily basis and cover.  No erythema cellulitis drainage or odor looks perfect.  Assessment: Well-healing surgical toe matricectomy tibial border hallux left.  Plan: Encouraged her to soak every other day cover during the daytime leave open at bedtime and continue Cortisporin Otic after soaking.

## 2018-04-01 ENCOUNTER — Other Ambulatory Visit: Payer: Self-pay | Admitting: Internal Medicine

## 2018-04-01 DIAGNOSIS — Z1231 Encounter for screening mammogram for malignant neoplasm of breast: Secondary | ICD-10-CM

## 2018-05-04 ENCOUNTER — Ambulatory Visit
Admission: RE | Admit: 2018-05-04 | Discharge: 2018-05-04 | Disposition: A | Discharge status: Home / Self Care | Payer: Medicare Other | Source: Ambulatory Visit | Attending: Internal Medicine | Admitting: Internal Medicine

## 2018-05-04 DIAGNOSIS — Z1231 Encounter for screening mammogram for malignant neoplasm of breast: Secondary | ICD-10-CM | POA: Diagnosis present

## 2018-07-14 ENCOUNTER — Ambulatory Visit (INDEPENDENT_AMBULATORY_CARE_PROVIDER_SITE_OTHER): Payer: Medicare Other

## 2018-07-14 ENCOUNTER — Encounter: Payer: Self-pay | Admitting: Podiatry

## 2018-07-14 ENCOUNTER — Ambulatory Visit: Payer: Medicare Other | Admitting: Podiatry

## 2018-07-14 DIAGNOSIS — M10071 Idiopathic gout, right ankle and foot: Secondary | ICD-10-CM

## 2018-07-14 NOTE — Progress Notes (Signed)
Subjective:  Patient ID: Diana Sullivan, female    DOB: [birth year],  MRN: [id number] HPI Chief Complaint  Patient presents with  . Foot Pain    Patient presents today for right foot infection which started 3 days ago.  She reports getting up on Sunday, her foot was very swollen, firey red and painful to touch.  she was seen by PCP and had CTscan done.  Dx with infection.  She was given Colchicine, Indomethason and Amoxicillin    She states now it feels much better, the swelling has gone down but still some redness to the right hallux    83 y.o. female presents with the above complaint.   ROS: Denies fever chills nausea vomiting muscle aches pains calf pain back pain chest pain shortness of breath.  Past Medical History:  Diagnosis Date  . Elevated lipids   . Hypertension    essential hypertension  . Mitral valve disorder   . Muscular dystrophy ([place])   . Pre-diabetes    Past Surgical History:  Procedure Laterality Date  . ABDOMINAL HYSTERECTOMY    . adenectomy    . BACK SURGERY    . BREAST SURGERY    . BUNIONECTOMY    . CARDIAC CATHETERIZATION    . COLONOSCOPY N/A 04/21/2016   Procedure: COLONOSCOPY;  Surgeon: Manya Silvas, MD;  Location: [facility] ENDOSCOPY;  Service: Endoscopy;  Laterality: N/A;  Diabetic  . EYE SURGERY    . metatarsal osteotomy Left 03-18-2013  . TONSILLECTOMY    . TYMPANOSTOMY TUBE PLACEMENT      Current Outpatient Medications:  .  amoxicillin-clavulanate (AUGMENTIN) 500-125 MG tablet, , Disp: , Rfl:  .  Blood Glucose Monitoring Suppl (ONE TOUCH ULTRA 2) w/Device KIT, , Disp: , Rfl:  .  colchicine 0.6 MG tablet, Take 1 tablet (0.6 mg total) by mouth daily. Take ii initially. Then one daily., Disp: 10 tablet, Rfl: 0 .  EPINEPHrine 0.3 mg/0.3 mL IJ SOAJ injection, Reported on 05/30/2015, Disp: , Rfl:  .  famotidine (PEPCID) 20 MG tablet, , Disp: , Rfl:  .  METOPROLOL SUCCINATE ER PO, Take 50 mg by mouth., Disp: , Rfl:  .  olmesartan-hydrochlorothiazide  (BENICAR HCT) 20-12.5 MG tablet, , Disp: , Rfl:  .  Omega-3 Fatty Acids (FISH OIL PO), Take by mouth., Disp: , Rfl:  .  ONE TOUCH ULTRA TEST test strip, , Disp: , Rfl:   Allergies  Allergen Reactions  . Azithromycin Rash  . Iodinated Diagnostic Agents     Other reaction(s): Other (See Comments) Other Reaction: Other reaction   Review of Systems Objective:  There were no vitals filed for this visit.  General: Well developed, nourished, in no acute distress, alert and oriented x3   Dermatological: Skin is warm, dry and supple bilateral. Nails x 10 are well maintained; remaining integument appears unremarkable at this time. There are no open sores, no preulcerative lesions, no rash or signs of infection present.  Vascular: Dorsalis Pedis artery and Posterior Tibial artery pedal pulses are 2/4 bilateral with immedate capillary fill time. Pedal hair growth present. No varicosities and no lower extremity edema present bilateral.   Neruologic: Grossly intact via light touch bilateral. Vibratory intact via tuning fork bilateral. Protective threshold with Semmes Wienstein monofilament intact to all pedal sites bilateral. Patellar and Achilles deep tendon reflexes 2+ bilateral. No Babinski or clonus noted bilateral.   Musculoskeletal: No gross boney pedal deformities bilateral. No pain, crepitus, or limitation noted with foot and ankle range of  motion bilateral. Muscular strength 5/5 in all groups tested bilateral.  No pain on palpation range of motion no erythema edema cellulitis drainage or odor.  Gait: Unassisted, Nonantalgic.    Radiographs:  Radiographs do not demonstrate any type of osseous abnormalities a well-healed bunionectomy second metatarsal osteotomy fifth metatarsal osteotomy is all gone on to heal with an Akin osteotomy which is healed as well.  No signs of edema no gases no acute injury.  Assessment & Plan:   Assessment: Probable gouty capsulitis  Plan: She was given  prednisone prior to the CT scan as well as colchicine and indomethacin all of which more than likely drove down the gout quite nicely she still has some tenderness along the medial aspect of the toe but I expressed to her the very next time that she gets this she is to notify us immediately and follow-up with [place] immediately for a blood work requisition for uric acid and a CBC.  We also need a sed rate.  This is to happen even if I am not here.      T. [place], [place]

## 2018-12-17 ENCOUNTER — Telehealth: Payer: Self-pay | Admitting: *Deleted

## 2018-12-17 DIAGNOSIS — Z20822 Contact with and (suspected) exposure to covid-19: Secondary | ICD-10-CM

## 2018-12-17 NOTE — Telephone Encounter (Signed)
Caryl Pina: McGrath Tejan-Sie,MD Phone: [phone]  Calling to request COVID testing for patient- recent travel to [facility]- exposure,  having symptoms  Attempted to call patient- left message on answering machine to call back to schedule testing. Order has been placed.

## 2018-12-17 NOTE — Telephone Encounter (Signed)
Pt. Scheduled for Monday.

## 2018-12-20 ENCOUNTER — Other Ambulatory Visit: Payer: Medicare Other

## 2018-12-20 DIAGNOSIS — Z20822 Contact with and (suspected) exposure to covid-19: Secondary | ICD-10-CM

## 2018-12-24 LAB — NOVEL CORONAVIRUS, NAA: SARS-CoV-2, NAA: NOT DETECTED

## 2019-01-03 ENCOUNTER — Encounter: Payer: Self-pay | Admitting: Podiatry

## 2019-01-03 ENCOUNTER — Ambulatory Visit (INDEPENDENT_AMBULATORY_CARE_PROVIDER_SITE_OTHER): Payer: Medicare Other | Admitting: Podiatry

## 2019-01-03 ENCOUNTER — Ambulatory Visit (INDEPENDENT_AMBULATORY_CARE_PROVIDER_SITE_OTHER): Payer: Medicare Other

## 2019-01-03 ENCOUNTER — Other Ambulatory Visit: Payer: Self-pay

## 2019-01-03 VITALS — Temp 98.2°F

## 2019-01-03 DIAGNOSIS — S99922A Unspecified injury of left foot, initial encounter: Secondary | ICD-10-CM

## 2019-01-03 DIAGNOSIS — S92515B Nondisplaced fracture of proximal phalanx of left lesser toe(s), initial encounter for open fracture: Secondary | ICD-10-CM

## 2019-01-03 NOTE — Progress Notes (Signed)
This patient presents the office with chief complaint of a painful fourth toe on her left foot.  Patient states that she stumped her toe on the bed 3 days ago.  She says that her toe was medially deviated and she put the toe back into position.  She held it in place by placing a rubber band around her other toes.  She presents the office today for further evaluation and treatment of this fourth toe left.  Patient has had surgery for the correction of a hammertoe second right by Dr. Milinda Pointer.  She also says she has significant foot surgery performed at [facility] 8 to 10 years ago.  Vascular  Dorsalis pedis and posterior tibial pulses are palpable  B/L.  Capillary return  WNL.  Temperature gradient is  WNL.  Skin turgor  WNL  Sensorium  Senn Weinstein monofilament wire  WNL. Normal tactile sensation.  Nail Exam  Patient has normal nails with no evidence of bacterial or fungal infection.  Orthopedic  Exam  Muscle tone and muscle strength  WNL.  No limitations of motion feet  B/L.  No crepitus or joint effusion noted.  Foot type is unremarkable and digits show no abnormalities.  Swelling and palpable pain fourth toe left foot.   Skin  No open lesions.  Normal skin texture and turgor.  Closed proximal phalanx fourth toe left foot.  IE.  X-ray reveals a fracture at the head of the proximal phalanx of the fourth digit left foot.  Buddy tape fourth toe to third toe left foot.  Patient to ambulate with her shoes and she also says she has wooden shoes at home as needed. RTC prn.   Gardiner Barefoot DPM

## 2019-01-21 ENCOUNTER — Ambulatory Visit: Payer: Medicare Other | Admitting: Sports Medicine

## 2019-01-25 ENCOUNTER — Other Ambulatory Visit: Payer: Self-pay

## 2019-01-25 ENCOUNTER — Ambulatory Visit (INDEPENDENT_AMBULATORY_CARE_PROVIDER_SITE_OTHER): Payer: Medicare Other | Admitting: Sports Medicine

## 2019-01-25 ENCOUNTER — Encounter: Payer: Self-pay | Admitting: Sports Medicine

## 2019-01-25 VITALS — Temp 97.2°F

## 2019-01-25 DIAGNOSIS — M79674 Pain in right toe(s): Secondary | ICD-10-CM

## 2019-01-25 DIAGNOSIS — B351 Tinea unguium: Secondary | ICD-10-CM

## 2019-01-25 DIAGNOSIS — M79675 Pain in left toe(s): Secondary | ICD-10-CM

## 2019-01-25 DIAGNOSIS — S99922D Unspecified injury of left foot, subsequent encounter: Secondary | ICD-10-CM

## 2019-01-25 DIAGNOSIS — S92502D Displaced unspecified fracture of left lesser toe(s), subsequent encounter for fracture with routine healing: Secondary | ICD-10-CM

## 2019-01-25 NOTE — Progress Notes (Signed)
Subjective: Diana Sullivan is a 83 y.o. female patient seen today in office with complaint of mildly painful thickened and discolored nails. Patient is desiring treatment for nail changes; has tried OTC topicals/Medication in the past with no improvement.  Patient reports that she is worried that her nails have been changing and wants to have her nails checked for fungus is afraid that she may have nails like her father.  Reports that nails are becoming difficult to manage because of the thickness.  Patient is also questioning why she still has swelling and soreness at the left fourth toe thought that after she The splint or taping on her fourth toe for about a week that the pain and swelling would be gone.  Patient has no other pedal complaints at this time.   Patient Active Problem List   Diagnosis Date Noted  . Nondisplaced fracture of proximal phalanx of left lesser toe(s), initial encounter for open fracture 01/03/2019  . Localized, primary osteoarthritis of the ankle and foot, left 05/15/2014  . Chest pain on exertion 01/30/2014  . Primary localized osteoarthrosis of ankle and foot 11/16/2013  . Statin intolerance 09/03/2013  . Hyperlipidemia, unspecified 09/03/2013  . Cataract 12/09/2010  . Other abnormal glucose 12/09/2010  . Mitral valve disorders(424.0) 12/09/2010  . 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 Benign essential hypertension 12/09/2010  . Essential hypertension 02/10/2008  . S/P cardiac catheterization 06/18/2006    Current Outpatient Medications on File Prior to Visit  Medication Sig Dispense Refill  . amoxicillin-clavulanate (AUGMENTIN) 500-125 MG tablet     . Blood Glucose Monitoring Suppl (ONE TOUCH ULTRA 2) w/Device KIT     . ciprofloxacin (CIPRO) 500 MG tablet     . colchicine 0.6 MG tablet Take 1 tablet (0.6 mg total) by mouth daily. Take ii initially. Then one daily. 10 tablet 0  . diclofenac sodium (VOLTAREN) 1 % GEL     . EPINEPHrine  0.3 mg/0.3 mL IJ SOAJ injection Reported on 05/30/2015    . famotidine (PEPCID) 20 MG tablet     . meloxicam (MOBIC) 15 MG tablet     . metoprolol succinate (TOPROL-XL) 50 MG 24 hr tablet     . naproxen (NAPROSYN) 500 MG tablet     . olmesartan-hydrochlorothiazide (BENICAR HCT) 20-12.5 MG tablet     . Omega-3 Fatty Acids (FISH OIL PO) Take by mouth.    . ONE TOUCH ULTRA TEST test strip      No current facility-administered medications on file prior to visit.     Allergies  Allergen Reactions  . Azithromycin Rash  . Iodinated Diagnostic Agents     Other reaction(s): Other (See Comments) Other Reaction: Other reaction    Objective: Physical Exam  General: Well developed, nourished, no acute distress, awake, alert and oriented x 3  Vascular: Dorsalis pedis artery 2/4 bilateral, Posterior tibial artery 1/4 bilateral, skin temperature warm to warm proximal to distal bilateral lower extremities, no varicosities, pedal hair present bilateral.  Mild focal swelling to the left fourth toe.  Neurological: Gross sensation present via light touch bilateral.   Dermatological: Skin is warm, dry, and supple bilateral, Nails 1-10 are tender, short thick, and discolored with mild subungal debris, no webspace macerations present bilateral, no open lesions present bilateral, no callus/corns/hyperkeratotic tissue present bilateral. No signs of infection bilateral.  Musculoskeletal: Mild pain at left fourth toe at area of history of fracture.  Hammertoe boney deformities noted bilateral. Muscular strength within normal limits without painon  range of motion. No pain with calf compression bilateral.  Assessment and Plan:  Problem List Items Addressed This Visit      Musculoskeletal and Integument   Nondisplaced fracture of proximal phalanx of left lesser toe(s), initial encounter for open fracture    Other Visit Diagnoses    Nail fungus    -  Primary   Relevant Orders   Culture, fungus without  smear   Injury of toe on left foot, subsequent encounter       Toe pain, bilateral          -Examined patient -Discussed treatment options for painful dystrophic nails  -Fungal culture was obtained by removing a portion of the hard nail itself from each of the involved toenails using a sterile nail nipper and sent to [facility] lab. Patient tolerated the biopsy procedure well without discomfort or need for anesthesia.  -Advised patient that it will take more time for fracture to heal was here less than 3 weeks ago where she was diagnosed with a fracture of the left fourth toe advised patient that she should continue with stiff soled shoe and buddy taping until the swelling is resolved -Patient to return in 4 weeks for follow up evaluation and discussion of fungal culture results and x-ray of left foot to follow-up fourth toe fracture or sooner if symptoms worsen.  Landis Martins, DPM

## 2019-02-16 ENCOUNTER — Emergency Department
Admission: EM | Admit: 2019-02-16 | Discharge: 2019-02-16 | Disposition: A | Discharge status: Home / Self Care | Payer: Medicare Other | Attending: Emergency Medicine | Admitting: Emergency Medicine

## 2019-02-16 ENCOUNTER — Emergency Department: Payer: Medicare Other

## 2019-02-16 ENCOUNTER — Encounter: Payer: Self-pay | Admitting: Emergency Medicine

## 2019-02-16 ENCOUNTER — Other Ambulatory Visit: Payer: Self-pay

## 2019-02-16 DIAGNOSIS — Z79899 Other long term (current) drug therapy: Secondary | ICD-10-CM | POA: Diagnosis not present

## 2019-02-16 DIAGNOSIS — S06310A Contusion and laceration of right cerebrum without loss of consciousness, initial encounter: Secondary | ICD-10-CM | POA: Diagnosis not present

## 2019-02-16 DIAGNOSIS — Y939 Activity, unspecified: Secondary | ICD-10-CM | POA: Diagnosis not present

## 2019-02-16 DIAGNOSIS — Y999 Unspecified external cause status: Secondary | ICD-10-CM | POA: Diagnosis not present

## 2019-02-16 DIAGNOSIS — I10 Essential (primary) hypertension: Secondary | ICD-10-CM

## 2019-02-16 DIAGNOSIS — Y92513 Shop (commercial) as the place of occurrence of the external cause: Secondary | ICD-10-CM | POA: Insufficient documentation

## 2019-02-16 DIAGNOSIS — Z87891 Personal history of nicotine dependence: Secondary | ICD-10-CM | POA: Diagnosis not present

## 2019-02-16 DIAGNOSIS — S0990XA Unspecified injury of head, initial encounter: Secondary | ICD-10-CM | POA: Diagnosis present

## 2019-02-16 DIAGNOSIS — W19XXXA Unspecified fall, initial encounter: Secondary | ICD-10-CM

## 2019-02-16 DIAGNOSIS — S134XXA Sprain of ligaments of cervical spine, initial encounter: Secondary | ICD-10-CM | POA: Insufficient documentation

## 2019-02-16 DIAGNOSIS — W010XXA Fall on same level from slipping, tripping and stumbling without subsequent striking against object, initial encounter: Secondary | ICD-10-CM | POA: Insufficient documentation

## 2019-02-16 MED ORDER — AMLODIPINE BESYLATE 10 MG PO TABS: 10.0000 mg | ORAL_TABLET | Freq: Every day | ORAL | 0 refills | Status: DC

## 2019-02-16 MED ORDER — AMLODIPINE BESYLATE 5 MG PO TABS
5.0000 mg | ORAL_TABLET | Freq: Once | ORAL | Status: AC
  Administered 2019-02-16: 5 mg via ORAL
  Filled 2019-02-16: qty 1

## 2019-02-16 MED ORDER — ACETAMINOPHEN 500 MG PO TABS
1000.0000 mg | ORAL_TABLET | Freq: Once | ORAL | Status: AC
  Administered 2019-02-16: 500 mg via ORAL
  Filled 2019-02-16: qty 2

## 2019-02-16 NOTE — ED Triage Notes (Signed)
Pt to ER states she fell over stock in the floor at a [employer].  States she landed on her right shoulder and hit her head.  Pt denies LOC, denies dizziness, does not take blood thinners.  PT alert and oriented during triage.

## 2019-02-16 NOTE — ED Provider Notes (Signed)
[facility] Emergency Department Provider Note  ____________________________________________  Time seen: Approximately 10:18 PM  I have reviewed the triage vital signs and the nursing notes.   HISTORY  Chief Complaint Fall    HPI Diana Sullivan is a 83 y.o. female with a history of hypertension, muscular dystrophy who was in her baseline state of health when she had a slip and fall while in a department store.  She fell straight back but managed to turn and landed on her right shoulder.  She did hit the right side of her head on the ground.  No loss of consciousness.  She has resulting neck pain which is nonradiating.  Denies any motor weakness or paresthesias.  This happened at about 5:00 PM today.  Pain is constant, mild to moderate intensity, no aggravating or alleviating factors.  Denies any vision changes.  No vomiting.      Past Medical History:  Diagnosis Date  . Elevated lipids   . Hypertension    essential hypertension  . Mitral valve disorder   . Muscular dystrophy ([place])   . Pre-diabetes      Patient Active Problem List   Diagnosis Date Noted  . Nondisplaced fracture of proximal phalanx of left lesser toe(s), initial encounter for open fracture 01/03/2019  . Localized, primary osteoarthritis of the ankle and foot, left 05/15/2014  . Chest pain on exertion 01/30/2014  . Primary localized osteoarthrosis of ankle and foot 11/16/2013  . Statin intolerance 09/03/2013  . Hyperlipidemia, unspecified 09/03/2013  . Cataract 12/09/2010  . Other abnormal glucose 12/09/2010  . Mitral valve disorders(424.0) 12/09/2010  . Hereditary progressive muscular dystrophy ([place]) 12/09/2010  . Benign essential hypertension 12/09/2010  . Essential hypertension 02/10/2008  . S/P cardiac catheterization 06/18/2006     Past Surgical History:  Procedure Laterality Date  . ABDOMINAL HYSTERECTOMY    . adenectomy    . BACK SURGERY    . BREAST SURGERY    .  BUNIONECTOMY    . CARDIAC CATHETERIZATION    . COLONOSCOPY N/A 04/21/2016   Procedure: COLONOSCOPY;  Surgeon: Manya Silvas, MD;  Location: [facility] ENDOSCOPY;  Service: Endoscopy;  Laterality: N/A;  Diabetic  . EYE SURGERY    . metatarsal osteotomy Left 03-18-2013  . TONSILLECTOMY    . TYMPANOSTOMY TUBE PLACEMENT       Prior to Admission medications   Medication Sig Start Date End Date Taking? Authorizing Provider  amoxicillin-clavulanate (AUGMENTIN) 500-125 MG tablet  07/06/18   [provider]  Blood Glucose Monitoring Suppl (ONE TOUCH ULTRA 2) w/Device KIT  04/07/16   [provider]  ciprofloxacin (CIPRO) 500 MG tablet  08/30/18   [provider]  colchicine 0.6 MG tablet Take 1 tablet (0.6 mg total) by mouth daily. Take ii initially. Then one daily. 07/21/17   Edrick Kins, DPM  diclofenac sodium (VOLTAREN) 1 % GEL  12/29/18   [provider]  EPINEPHrine 0.3 mg/0.3 mL IJ SOAJ injection Reported on 05/30/2015 03/06/15   [provider]  famotidine (PEPCID) 20 MG tablet  07/06/18   [provider]  meloxicam (MOBIC) 15 MG tablet  01/14/19   [provider]  metoprolol succinate (TOPROL-XL) 50 MG 24 hr tablet  12/29/18   [provider]  naproxen (NAPROSYN) 500 MG tablet  12/29/18   [provider]  olmesartan-hydrochlorothiazide (BENICAR HCT) 20-12.5 MG tablet  11/20/17   [provider]  Omega-3 Fatty Acids (FISH OIL PO) Take by mouth.  [provider]  ONE TOUCH ULTRA TEST test strip  04/07/16   [provider]     Allergies Azithromycin and Iodinated diagnostic agents   No family history on file.  Social History Social History   Tobacco Use  . Smoking status: Former Research scientist (life sciences)  . Smokeless tobacco: Never Used  . Tobacco comment: QUIT 25 YEARS AGO  Substance Use Topics  . Alcohol use: Yes    Comment: SOCIAL  . Drug use: Not on file    Review of  Systems  Constitutional:   No fever or chills.  ENT:   No sore throat. No rhinorrhea. Cardiovascular:   No chest pain or syncope. Respiratory:   No dyspnea or cough. Gastrointestinal:   Negative for abdominal pain, vomiting and diarrhea.  Musculoskeletal:   Right shoulder pain as above. All other systems reviewed and are negative except as documented above in ROS and HPI.  ____________________________________________   PHYSICAL EXAM:  VITAL SIGNS: ED Triage Vitals  Enc Vitals Group     BP 02/16/19 1818 (!) 155/85     Pulse Rate 02/16/19 1818 91     Resp 02/16/19 1818 18     Temp 02/16/19 1819 99.6 F (37.6 C)     Temp src --      SpO2 02/16/19 1818 99 %     Weight --      Height --      Head Circumference --      Peak Flow --      Pain Score 02/16/19 1819 5     Pain Loc --      Pain Edu? --      Excl. in [place]? --     Vital signs reviewed, nursing assessments reviewed.   Constitutional:   Alert and oriented. Non-toxic appearance. Eyes:   Conjunctivae are normal. EOMI. PERRL.  No nystagmus.  No raccoon eyes. ENT      Head:   Normocephalic and atraumatic.  No battle sign.  No otorrhea      Nose:   No congestion/rhinnorhea.  No epistaxis or septal hematoma      Mouth/Throat:   MMM, no pharyngeal erythema. No peritonsillar mass.  No intraoral injury      Neck:   No meningismus. Full ROM.  Positive midline spinal tenderness at C1-C2 Hematological/Lymphatic/Immunilogical:   No cervical lymphadenopathy. Cardiovascular:   RRR. Symmetric bilateral radial and DP pulses.  No murmurs. Cap refill less than 2 seconds. Respiratory:   Normal respiratory effort without tachypnea/retractions. Breath sounds are clear and equal bilaterally. No wheezes/rales/rhonchi. Gastrointestinal:   Soft and nontender. Non distended. There is no CVA tenderness.  No rebound, rigidity, or guarding.  Musculoskeletal:   Normal range of motion in all extremities. No joint effusions.  Diffuse tenderness about  the right shoulder and proximal humerus.  No bony point tenderness.  No deformity or step-off.  No crepitus.. Neurologic:   Normal speech and language.  Motor grossly intact. No acute focal neurologic deficits are appreciated.  Skin:    Skin is warm, dry and intact. No rash noted.  No petechiae, purpura, or bullae.  ____________________________________________    LABS (pertinent positives/negatives) (all labs ordered are listed, but only abnormal results are displayed) Labs Reviewed - No data to display ____________________________________________   EKG    ____________________________________________    RADIOLOGY  Dg Shoulder Right  Result Date: 02/16/2019 CLINICAL DATA:  Right shoulder pain EXAM: RIGHT SHOULDER - 2+ VIEW COMPARISON:  None. FINDINGS: No fracture or  dislocation is seen. Mild degenerative changes of the glenohumeral joint. Mild irregularity of the posterolateral humeral head may reflect a chronic deformity, but is not favored to be acute. The visualized soft tissues are unremarkable. Visualized right lung is clear. IMPRESSION: Negative. Electronically Signed   By: Julian Hy M.D.   On: 02/16/2019 19:03   Ct Head Wo Contrast  Result Date: 02/16/2019 CLINICAL DATA:  Head trauma, fall EXAM: CT HEAD WITHOUT CONTRAST; CT CERVICAL SPINE WITHOUT CONTRAST TECHNIQUE: Contiguous axial images were obtained from the base of the skull through the vertex without intravenous contrast. COMPARISON:  None. FINDINGS: Brain: There is a focus of hyperdensity seen within the right basal ganglia, series 2, image 10 which could represent a foci of intraparenchymal hemorrhage. No extra-axial collections or midline shift is seen. There is mild dilatation the ventricles and sulci consistent with age-related atrophy. Low-attenuation changes in the deep white matter consistent with small vessel ischemia. Vascular: No hyperdense vessel or unexpected calcification. Skull: The skull is intact. No  fracture or focal lesion identified. Sinuses/Orbits: The visualized paranasal sinuses and mastoid air cells are clear. The orbits and globes intact. Other: None Cervical spine: Alignment: There is widening of the atlantodental interval measuring 3.6 mm. A grade 1 anterolisthesis C2 on C3 is seen measuring 3 mm. There is reversal of the normal cervical lordosis. There appears to be slight widening of the facet joints seen at C5-C6 which may be degenerative. Skull base and vertebrae: Visualized skull base is intact. The vertebral body heights are well maintained. No fracture or pathologic osseous lesion seen. Soft tissues and spinal canal: The visualized paraspinal soft tissues are unremarkable. No prevertebral soft tissue swelling is seen. The spinal canal is grossly unremarkable, no large epidural collection or significant canal narrowing. Disc levels: Multilevel degenerative changes noted most notable from C3 through C6 with uncovertebral osteophytes large anterior osteophytes and disc osteophyte complexes. There is disc height loss and vacuum phenomenon. The Upper chest: The lung apices are clear. Thoracic inlet is within normal limits. Other: None IMPRESSION: 1. Tiny focus of hyperdense signal seen within the right basal ganglia which could represent a tiny intraparenchymal hemorrhage. 2. Findings consistent with mild age related atrophy and chronic small vessel ischemia 3. Widening of the atlantodental interval which could be due to ligamentous injury. If further evaluation is required, would recommend MRI. 4. No definite acute cervical spine fracture. 5. Grade 1 anterolisthesis of C2 on C3. Electronically Signed   By: Prudencio Pair M.D.   On: 02/16/2019 19:37   Ct Cervical Spine Wo Contrast  Result Date: 02/16/2019 CLINICAL DATA:  Head trauma, fall EXAM: CT HEAD WITHOUT CONTRAST; CT CERVICAL SPINE WITHOUT CONTRAST TECHNIQUE: Contiguous axial images were obtained from the base of the skull through the vertex  without intravenous contrast. COMPARISON:  None. FINDINGS: Brain: There is a focus of hyperdensity seen within the right basal ganglia, series 2, image 10 which could represent a foci of intraparenchymal hemorrhage. No extra-axial collections or midline shift is seen. There is mild dilatation the ventricles and sulci consistent with age-related atrophy. Low-attenuation changes in the deep white matter consistent with small vessel ischemia. Vascular: No hyperdense vessel or unexpected calcification. Skull: The skull is intact. No fracture or focal lesion identified. Sinuses/Orbits: The visualized paranasal sinuses and mastoid air cells are clear. The orbits and globes intact. Other: None Cervical spine: Alignment: There is widening of the atlantodental interval measuring 3.6 mm. A grade 1 anterolisthesis C2 on C3 is seen measuring 3  mm. There is reversal of the normal cervical lordosis. There appears to be slight widening of the facet joints seen at C5-C6 which may be degenerative. Skull base and vertebrae: Visualized skull base is intact. The vertebral body heights are well maintained. No fracture or pathologic osseous lesion seen. Soft tissues and spinal canal: The visualized paraspinal soft tissues are unremarkable. No prevertebral soft tissue swelling is seen. The spinal canal is grossly unremarkable, no large epidural collection or significant canal narrowing. Disc levels: Multilevel degenerative changes noted most notable from C3 through C6 with uncovertebral osteophytes large anterior osteophytes and disc osteophyte complexes. There is disc height loss and vacuum phenomenon. The Upper chest: The lung apices are clear. Thoracic inlet is within normal limits. Other: None IMPRESSION: 1. Tiny focus of hyperdense signal seen within the right basal ganglia which could represent a tiny intraparenchymal hemorrhage. 2. Findings consistent with mild age related atrophy and chronic small vessel ischemia 3. Widening of  the atlantodental interval which could be due to ligamentous injury. If further evaluation is required, would recommend MRI. 4. No definite acute cervical spine fracture. 5. Grade 1 anterolisthesis of C2 on C3. Electronically Signed   By: Prudencio Pair M.D.   On: 02/16/2019 19:37   Mr Cervical Spine Wo Contrast  Result Date: 02/16/2019 CLINICAL DATA:  C-spine trauma after falling, slight widening of the atlantodental interval on CT EXAM: MRI CERVICAL SPINE WITHOUT CONTRAST TECHNIQUE: Multiplanar, multisequence MR imaging of the cervical spine was performed. No intravenous contrast was administered. COMPARISON:  CT cervical spine same day FINDINGS: Alignment: There is reversal of the normal cervical lordosis. In minimal anterolisthesis of C2 on C3 is seen measuring 2 mm. Vertebrae: There is slight anterior wedging of the mid cervical spine from C2 through C6 with large anterior osteophytes. There is increased STIR signal seen within the C3 through C6 vertebral bodies and left-sided facet joints from C4 through C6. Cord: Normal signal and morphology. Posterior Fossa, vertebral arteries, paraspinal tissues: Again noted is mild widening of the atlantodental interval measuring 3.1 mm. There is heterogeneous signal seen within the region of the apical segment of ligament superior to the dens. However the tectorial membrane and posterior longitudinal ligament appear to be intact. There is chronic thickening of the posterior longitudinal ligament. The anterior atlantooccipital membrane is intact. The interspinous ligaments appear to be intact. The visualized portion of the posterior fossa is unremarkable. Normal flow voids seen within the vertebral arteries. Disc levels: Multilevel degenerative changes are seen most notable from C3 there C6 with disc osteophyte complex and uncovertebral osteophytes there is mild central canal stenosis at these levels. IMPRESSION: 1. Again noted is mild widening of the atlantodental  interval. There is heterogeneous signal seen within the region of the apical ligament, however the tectorial membrane and anterior atlantooccipital membrane are intact with thickening of the posterior longitudinal ligament. This is thought to the likely favor degenerative changes, however could be mild sprain within the region of the apical ligament. 2. Multilevel degenerative changes, most notable from C3 through C6 3. Grade 1 anterolisthesis of C2 on C3, degenerative. Electronically Signed   By: Prudencio Pair M.D.   On: 02/16/2019 22:11    ____________________________________________   PROCEDURES Procedures  ____________________________________________  DIFFERENTIAL DIAGNOSIS   Intracranial hemorrhage, C-spine fracture, proximal humerus fracture  CLINICAL IMPRESSION / ASSESSMENT AND PLAN / ED COURSE  Medications ordered in the ED: Medications  acetaminophen (TYLENOL) tablet 1,000 mg (500 mg Oral Given 02/16/19 2201)    Pertinent  labs & imaging results that were available during my care of the patient were reviewed by me and considered in my medical decision making (see chart for details).  Diana Sullivan was evaluated in Emergency Department on 02/16/2019 for the symptoms described in the history of present illness. She was evaluated in the context of the global COVID-19 pandemic, which necessitated consideration that the patient might be at risk for infection with the SARS-CoV-2 virus that causes COVID-19. Institutional protocols and algorithms that pertain to the evaluation of patients at risk for COVID-19 are in a state of rapid change based on information released by regulatory bodies including the CDC and federal and state organizations. These policies and algorithms were followed during the patient's care in the ED.     Clinical Course as of Feb 16 2220  Wed Feb 16, 2019  2027 Patient presents with upper neck pain and shoulder pain after a slip and fall.  Neurologically intact.  She  does have upper C-spine tenderness.  CT is concerning for a small amount of cerebral contusion at the right basal ganglia as well as possible widening of the atlantodental interval.  Discussed with neurosurgery Dr. Cari Caraway who recommends 4-hour interval CT to ensure no ongoing bleeding, agrees with MRI to further evaluate C-spine.  With her pain the patient will need to be kept in a cervical collar for the next 2 weeks pending neurosurgery clinic follow-up.   [PS]  2214 MRI consistent with cervical sprain, with intact ligaments.  We will fit the patient with a cervical collar that she can wear pending neurosurgery follow-up.    [PS]  2215 Plan is to repeat CT head at 11:30 PM tonight.  If no significant interval change patient is stable for discharge home to follow-up with Dr. Cari Caraway.   [PS]    Clinical Course User Index [PS] Carrie Mew, MD     ____________________________________________   FINAL CLINICAL IMPRESSION(S) / ED DIAGNOSES    Final diagnoses:  Fall, initial encounter  Contusion of right cerebral hemisphere, without loss of consciousness, initial encounter ([place])  Sprain of ligaments of cervical spine, initial encounter     ED Discharge Orders    None      Portions of this note were generated with dragon dictation software. Dictation errors may occur despite best attempts at proofreading.   Carrie Mew, MD 02/16/19 2221

## 2019-02-16 NOTE — ED Provider Notes (Signed)
-----------------------------------------   11:09 PM on 02/16/2019 -----------------------------------------  Assuming care from Dr. Joni Fears.  In short, Diana Sullivan is a 83 y.o. female with a chief complaint of fall.  Refer to the original H&P for additional details.  The current plan of care is to follow-up on the repeat CT head.   ----------------------------------------- 11:42 PM on 02/16/2019 -----------------------------------------  Repeat CT scan does not show a clinically significant increase in the size of the abnormality seen on the prior study.  As per neurosurgery recommendations, she will be discharged for close outpatient follow-up.  I discussed the results with the patient and she is eager to go home.  I gave my usual customary return precautions.   Hinda Kehr, MD 02/16/19 [phone]

## 2019-02-16 NOTE — Discharge Instructions (Addendum)
Your MRI shows that you have a sprain of the neck ligaments.  You should wear the cervical collar for the next 1 to 2 weeks until follow-up with neurosurgery clinic at which point they can reassess your symptoms and ensure that everything is back to normal.  Please increase your amlodipine to 10mg  once a day due to persistently high blood pressure.  Your primary care doctor can continue to monitor your blood pressure.

## 2019-03-04 ENCOUNTER — Other Ambulatory Visit: Payer: Self-pay | Admitting: Student

## 2019-03-04 DIAGNOSIS — G44309 Post-traumatic headache, unspecified, not intractable: Secondary | ICD-10-CM

## 2019-03-04 DIAGNOSIS — G959 Disease of spinal cord, unspecified: Secondary | ICD-10-CM

## 2019-03-08 ENCOUNTER — Ambulatory Visit: Payer: Medicare Other

## 2019-03-08 ENCOUNTER — Ambulatory Visit (INDEPENDENT_AMBULATORY_CARE_PROVIDER_SITE_OTHER): Payer: Medicare Other | Admitting: Sports Medicine

## 2019-03-08 ENCOUNTER — Other Ambulatory Visit: Payer: Self-pay

## 2019-03-08 DIAGNOSIS — M79671 Pain in right foot: Secondary | ICD-10-CM

## 2019-03-09 NOTE — Progress Notes (Signed)
Patient left before being seen.

## 2019-03-18 ENCOUNTER — Ambulatory Visit
Admission: RE | Admit: 2019-03-18 | Discharge: 2019-03-18 | Disposition: A | Discharge status: Home / Self Care | Payer: Medicare Other | Source: Ambulatory Visit | Attending: Student | Admitting: Student

## 2019-03-18 ENCOUNTER — Other Ambulatory Visit: Payer: Self-pay

## 2019-03-18 DIAGNOSIS — G959 Disease of spinal cord, unspecified: Secondary | ICD-10-CM | POA: Insufficient documentation

## 2019-03-18 DIAGNOSIS — G44309 Post-traumatic headache, unspecified, not intractable: Secondary | ICD-10-CM

## 2019-04-05 ENCOUNTER — Other Ambulatory Visit: Payer: Self-pay | Admitting: Internal Medicine

## 2019-04-05 DIAGNOSIS — Z1231 Encounter for screening mammogram for malignant neoplasm of breast: Secondary | ICD-10-CM

## 2019-07-15 ENCOUNTER — Ambulatory Visit: Payer: Medicare Other

## 2019-09-06 ENCOUNTER — Other Ambulatory Visit: Payer: Self-pay | Admitting: Sports Medicine

## 2019-09-06 DIAGNOSIS — M25462 Effusion, left knee: Secondary | ICD-10-CM

## 2019-09-06 DIAGNOSIS — G8929 Other chronic pain: Secondary | ICD-10-CM

## 2019-09-17 ENCOUNTER — Ambulatory Visit
Admission: RE | Admit: 2019-09-17 | Discharge: 2019-09-17 | Disposition: A | Discharge status: Home / Self Care | Payer: Medicare PPO | Source: Ambulatory Visit | Attending: Sports Medicine | Admitting: Sports Medicine

## 2019-09-17 DIAGNOSIS — M25562 Pain in left knee: Secondary | ICD-10-CM | POA: Insufficient documentation

## 2019-09-17 DIAGNOSIS — M25462 Effusion, left knee: Secondary | ICD-10-CM | POA: Diagnosis present

## 2019-09-17 DIAGNOSIS — G8929 Other chronic pain: Secondary | ICD-10-CM | POA: Insufficient documentation

## 2019-09-21 ENCOUNTER — Other Ambulatory Visit
Admission: RE | Admit: 2019-09-21 | Discharge: 2019-09-21 | Disposition: A | Discharge status: Home / Self Care | Payer: Medicare PPO | Source: Ambulatory Visit | Attending: Sports Medicine | Admitting: Sports Medicine

## 2019-09-21 DIAGNOSIS — M25562 Pain in left knee: Secondary | ICD-10-CM | POA: Diagnosis not present

## 2019-09-21 DIAGNOSIS — M7989 Other specified soft tissue disorders: Secondary | ICD-10-CM | POA: Insufficient documentation

## 2019-09-21 DIAGNOSIS — M19041 Primary osteoarthritis, right hand: Secondary | ICD-10-CM | POA: Insufficient documentation

## 2019-09-21 DIAGNOSIS — G8929 Other chronic pain: Secondary | ICD-10-CM | POA: Insufficient documentation

## 2019-09-21 DIAGNOSIS — M25462 Effusion, left knee: Secondary | ICD-10-CM | POA: Diagnosis present

## 2019-09-21 LAB — SYNOVIAL CELL COUNT + DIFF, W/ CRYSTALS
Crystals, Fluid: NONE SEEN
Eosinophils-Synovial: 0 %
Lymphocytes-Synovial Fld: 25 %
Monocyte-Macrophage-Synovial Fluid: 50 %
Neutrophil, Synovial: 25 %
Other Cells-SYN: 0
WBC, Synovial: 423 /mm3 — ABNORMAL HIGH (ref 0–200)

## 2019-09-30 DIAGNOSIS — M542 Cervicalgia: Secondary | ICD-10-CM | POA: Insufficient documentation

## 2019-09-30 DIAGNOSIS — M5412 Radiculopathy, cervical region: Secondary | ICD-10-CM | POA: Insufficient documentation

## 2019-10-03 ENCOUNTER — Ambulatory Visit
Admission: RE | Admit: 2019-10-03 | Discharge: 2019-10-03 | Disposition: A | Discharge status: Home / Self Care | Payer: Medicare PPO | Source: Ambulatory Visit | Attending: Internal Medicine | Admitting: Internal Medicine

## 2019-10-03 DIAGNOSIS — Z1231 Encounter for screening mammogram for malignant neoplasm of breast: Secondary | ICD-10-CM | POA: Diagnosis not present

## 2019-10-19 DIAGNOSIS — M6740 Ganglion, unspecified site: Secondary | ICD-10-CM | POA: Insufficient documentation

## 2019-11-02 ENCOUNTER — Encounter: Payer: Self-pay | Admitting: Ophthalmology

## 2019-11-02 ENCOUNTER — Other Ambulatory Visit: Payer: Self-pay

## 2019-11-03 ENCOUNTER — Encounter: Payer: Self-pay | Admitting: Ophthalmology

## 2019-11-10 ENCOUNTER — Other Ambulatory Visit
Admission: RE | Admit: 2019-11-10 | Discharge: 2019-11-10 | Disposition: A | Discharge status: Home / Self Care | Payer: Medicare PPO | Source: Ambulatory Visit | Attending: Ophthalmology | Admitting: Ophthalmology

## 2019-11-10 ENCOUNTER — Other Ambulatory Visit: Payer: Self-pay

## 2019-11-10 DIAGNOSIS — Z01812 Encounter for preprocedural laboratory examination: Secondary | ICD-10-CM | POA: Diagnosis present

## 2019-11-10 DIAGNOSIS — Z20822 Contact with and (suspected) exposure to covid-19: Secondary | ICD-10-CM | POA: Diagnosis not present

## 2019-11-10 LAB — SARS CORONAVIRUS 2 (TAT 6-24 HRS): SARS Coronavirus 2: NEGATIVE

## 2019-11-10 NOTE — Discharge Instructions (Signed)

## 2019-11-14 ENCOUNTER — Encounter: Payer: Self-pay | Admitting: Ophthalmology

## 2019-11-14 ENCOUNTER — Other Ambulatory Visit: Payer: Self-pay

## 2019-11-14 ENCOUNTER — Encounter
Admission: RE | Disposition: A | Discharge status: Home / Self Care | Payer: Self-pay | Source: Home / Self Care | Attending: Ophthalmology

## 2019-11-14 ENCOUNTER — Ambulatory Visit: Payer: Medicare PPO | Admitting: Anesthesiology

## 2019-11-14 ENCOUNTER — Ambulatory Visit
Admission: RE | Admit: 2019-11-14 | Discharge: 2019-11-14 | Disposition: A | Discharge status: Home / Self Care | Payer: Medicare PPO | Attending: Ophthalmology | Admitting: Ophthalmology

## 2019-11-14 DIAGNOSIS — I10 Essential (primary) hypertension: Secondary | ICD-10-CM | POA: Diagnosis not present

## 2019-11-14 DIAGNOSIS — E1136 Type 2 diabetes mellitus with diabetic cataract: Secondary | ICD-10-CM | POA: Insufficient documentation

## 2019-11-14 DIAGNOSIS — Z881 Allergy status to other antibiotic agents status: Secondary | ICD-10-CM | POA: Insufficient documentation

## 2019-11-14 DIAGNOSIS — Z87891 Personal history of nicotine dependence: Secondary | ICD-10-CM | POA: Diagnosis not present

## 2019-11-14 DIAGNOSIS — M1990 Unspecified osteoarthritis, unspecified site: Secondary | ICD-10-CM | POA: Diagnosis not present

## 2019-11-14 DIAGNOSIS — Z955 Presence of coronary angioplasty implant and graft: Secondary | ICD-10-CM | POA: Diagnosis not present

## 2019-11-14 DIAGNOSIS — Z79899 Other long term (current) drug therapy: Secondary | ICD-10-CM | POA: Insufficient documentation

## 2019-11-14 DIAGNOSIS — H2511 Age-related nuclear cataract, right eye: Secondary | ICD-10-CM | POA: Diagnosis present

## 2019-11-14 HISTORY — DX: Headache, unspecified: R51.9

## 2019-11-14 HISTORY — DX: Presence of dental prosthetic device (complete) (partial): Z97.2

## 2019-11-14 HISTORY — DX: Nonrheumatic mitral (valve) prolapse: I34.1

## 2019-11-14 HISTORY — PX: CATARACT EXTRACTION W/PHACO: SHX586

## 2019-11-14 HISTORY — DX: Unspecified osteoarthritis, unspecified site: M19.90

## 2019-11-14 HISTORY — DX: Bronchitis, not specified as acute or chronic: J40

## 2019-11-14 HISTORY — DX: Gout, unspecified: M10.9

## 2019-11-14 HISTORY — DX: Other complications of anesthesia, initial encounter: T88.59XA

## 2019-11-14 HISTORY — DX: Angina pectoris, unspecified: I20.9

## 2019-11-14 HISTORY — DX: History of falling: Z91.81

## 2019-11-14 SURGERY — PHACOEMULSIFICATION, CATARACT, WITH IOL INSERTION
Anesthesia: Monitor Anesthesia Care | Site: Eye | Laterality: Right

## 2019-11-14 MED ORDER — ACETAMINOPHEN 325 MG PO TABS: 325.0000 mg | ORAL_TABLET | Freq: Once | ORAL | Status: DC

## 2019-11-14 MED ORDER — MOXIFLOXACIN HCL 0.5 % OP SOLN
OPHTHALMIC | Status: DC | PRN
  Administered 2019-11-14: 0.2 mL via OPHTHALMIC

## 2019-11-14 MED ORDER — SODIUM HYALURONATE 23 MG/ML IO SOLN
INTRAOCULAR | Status: DC | PRN
  Administered 2019-11-14: 0.6 mL via INTRAOCULAR

## 2019-11-14 MED ORDER — SODIUM HYALURONATE 10 MG/ML IO SOLN
INTRAOCULAR | Status: DC | PRN
  Administered 2019-11-14: 0.55 mL via INTRAOCULAR

## 2019-11-14 MED ORDER — TETRACAINE HCL 0.5 % OP SOLN
1.0000 [drp] | OPHTHALMIC | Status: DC | PRN
  Administered 2019-11-14 (×3): 1 [drp] via OPHTHALMIC

## 2019-11-14 MED ORDER — ARMC OPHTHALMIC DILATING DROPS
1.0000 "application " | OPHTHALMIC | Status: DC | PRN
  Administered 2019-11-14 (×3): 1 via OPHTHALMIC

## 2019-11-14 MED ORDER — MIDAZOLAM HCL 2 MG/2ML IJ SOLN
INTRAMUSCULAR | Status: DC | PRN
  Administered 2019-11-14: 1 mg via INTRAVENOUS

## 2019-11-14 MED ORDER — EPINEPHRINE PF 1 MG/ML IJ SOLN
INTRAOCULAR | Status: DC | PRN
  Administered 2019-11-14: 88 mL via OPHTHALMIC

## 2019-11-14 MED ORDER — LIDOCAINE HCL (PF) 2 % IJ SOLN
INTRAOCULAR | Status: DC | PRN
  Administered 2019-11-14: 1 mL via INTRAOCULAR

## 2019-11-14 MED ORDER — FENTANYL CITRATE (PF) 100 MCG/2ML IJ SOLN
INTRAMUSCULAR | Status: DC | PRN
  Administered 2019-11-14: 50 ug via INTRAVENOUS

## 2019-11-14 MED ORDER — LACTATED RINGERS IV SOLN: 10.0000 mL/h | INTRAVENOUS | Status: DC

## 2019-11-14 MED ORDER — ACETAMINOPHEN 160 MG/5ML PO SOLN: 325.0000 mg | Freq: Once | ORAL | Status: DC

## 2019-11-14 SURGICAL SUPPLY — 20 items
CANNULA ANT/CHMB 27G (MISCELLANEOUS) ×2 IMPLANT
CANNULA ANT/CHMB 27GA (MISCELLANEOUS) ×6 IMPLANT
DISSECTOR HYDRO NUCLEUS 50X22 (MISCELLANEOUS) ×3 IMPLANT
GLOVE SURG LX 7.5 STRW (GLOVE) ×2
GLOVE SURG LX STRL 7.5 STRW (GLOVE) ×1 IMPLANT
GLOVE SURG SYN 8.5  E (GLOVE) ×3
GLOVE SURG SYN 8.5 E (GLOVE) ×1 IMPLANT
GLOVE SURG SYN 8.5 PF PI (GLOVE) ×1 IMPLANT
GOWN STRL REUS W/ TWL LRG LVL3 (GOWN DISPOSABLE) ×2 IMPLANT
GOWN STRL REUS W/TWL LRG LVL3 (GOWN DISPOSABLE) ×6
LENS IOL ACRSF IQ ULTRA 18.5 (Intraocular Lens) IMPLANT
LENS IOL ACRYSOF IQ 18.5 (Intraocular Lens) ×3 IMPLANT
MARKER SKIN DUAL TIP RULER LAB (MISCELLANEOUS) ×3 IMPLANT
PACK DR. KING ARMS (PACKS) ×3 IMPLANT
PACK EYE AFTER SURG (MISCELLANEOUS) ×3 IMPLANT
PACK OPTHALMIC (MISCELLANEOUS) ×3 IMPLANT
SYR 3ML LL SCALE MARK (SYRINGE) ×3 IMPLANT
SYR TB 1ML LUER SLIP (SYRINGE) ×3 IMPLANT
WATER STERILE IRR 250ML POUR (IV SOLUTION) ×3 IMPLANT
WIPE NON LINTING 3.25X3.25 (MISCELLANEOUS) ×3 IMPLANT

## 2019-11-14 NOTE — Anesthesia Preprocedure Evaluation (Signed)
Anesthesia Evaluation  Patient identified by MRN, date of birth, ID band Patient awake    Reviewed: Allergy & Precautions, H&P , NPO status , Patient's Chart, lab work & pertinent test results  Airway Mallampati: II  TM Distance: >3 FB Neck ROM: full    Dental no notable dental hx. (+) Partial Lower   Pulmonary former smoker,    Pulmonary exam normal breath sounds clear to auscultation       Cardiovascular hypertension, + angina Normal cardiovascular exam Rhythm:regular Rate:Normal     Neuro/Psych    GI/Hepatic   Endo/Other    Renal/GU      Musculoskeletal   Abdominal   Peds  Hematology   Anesthesia Other Findings   Reproductive/Obstetrics                             Anesthesia Physical Anesthesia Plan  ASA: II  Anesthesia Plan: MAC   Post-op Pain Management:    Induction:   PONV Risk Score and Plan: 2 and Treatment may vary due to age or medical condition, TIVA and Midazolam  Airway Management Planned:   Additional Equipment:   Intra-op Plan:   Post-operative Plan:   Informed Consent: I have reviewed the patients History and Physical, chart, labs and discussed the procedure including the risks, benefits and alternatives for the proposed anesthesia with the patient or authorized representative who has indicated his/her understanding and acceptance.     Dental Advisory Given  Plan Discussed with: CRNA  Anesthesia Plan Comments:         Anesthesia Quick Evaluation

## 2019-11-14 NOTE — Transfer of Care (Signed)
Immediate Anesthesia Transfer of Care Note  Patient: Diana Sullivan  Procedure(s) Performed: CATARACT EXTRACTION PHACO AND INTRAOCULAR LENS PLACEMENT (IOC) RIGHT 4.76  00:44.4 (Right Eye)  Patient Location: PACU  Anesthesia Type: MAC  Level of Consciousness: awake, alert  and patient cooperative  Airway and Oxygen Therapy: Patient Spontanous Breathing and Patient connected to supplemental oxygen  Post-op Assessment: Post-op Vital signs reviewed, Patient's Cardiovascular Status Stable, Respiratory Function Stable, Patent Airway and No signs of Nausea or vomiting  Post-op Vital Signs: Reviewed and stable  Complications: No apparent anesthesia complications

## 2019-11-14 NOTE — Anesthesia Postprocedure Evaluation (Signed)
Anesthesia Post Note  Patient: Diana Sullivan  Procedure(s) Performed: CATARACT EXTRACTION PHACO AND INTRAOCULAR LENS PLACEMENT (IOC) RIGHT 4.76  00:44.4 (Right Eye)     Patient location during evaluation: PACU Anesthesia Type: MAC Level of consciousness: awake and alert and oriented Pain management: satisfactory to patient Vital Signs Assessment: post-procedure vital signs reviewed and stable Respiratory status: spontaneous breathing, nonlabored ventilation and respiratory function stable Cardiovascular status: blood pressure returned to baseline and stable Postop Assessment: Adequate PO intake and No signs of nausea or vomiting Anesthetic complications: no    Raliegh Ip

## 2019-11-14 NOTE — Op Note (Signed)
OPERATIVE NOTE  Diana Sullivan [id number] 11/14/2019   PREOPERATIVE DIAGNOSIS:  Nuclear sclerotic cataract right eye.  H25.11   POSTOPERATIVE DIAGNOSIS:    Nuclear sclerotic cataract right eye.     PROCEDURE:  Phacoemusification with posterior chamber intraocular lens placement of the right eye   LENS:   Implant Name Type Inv. Item Serial No. Manufacturer Lot No. LRB No. Used Action  LENS IOL ACRYSOF IQ 18.5 - [id number] Intraocular Lens LENS IOL ACRYSOF IQ 18.5 [id number] [employer]  Right 1 Implanted       Procedure(s): CATARACT EXTRACTION PHACO AND INTRAOCULAR LENS PLACEMENT (IOC) RIGHT 4.76  00:44.4 (Right)  [id number] +18.5   ULTRASOUND TIME: 0 minutes 44 seconds.  CDE 4.76   SURGEON:  Benay Pillow, MD, MPH  ANESTHESIOLOGIST: Anesthesiologist: Ronelle Nigh, MD CRNA: Georga Bora, CRNA   ANESTHESIA:  Topical with tetracaine drops augmented with 1% preservative-free intracameral lidocaine.  ESTIMATED BLOOD LOSS: less than 1 mL.   COMPLICATIONS:  None.   DESCRIPTION OF PROCEDURE:  The patient was identified in the holding room and transported to the operating room and placed in the supine position under the operating microscope.  The right eye was identified as the operative eye and it was prepped and draped in the usual sterile ophthalmic fashion.   A 1.0 millimeter clear-corneal paracentesis was made at the 10:30 position. 0.5 ml of preservative-free 1% lidocaine with epinephrine was injected into the anterior chamber.  The anterior chamber was filled with Healon 5 viscoelastic.  A 2.4 millimeter keratome was used to make a near-clear corneal incision at the 8:00 position.  A curvilinear capsulorrhexis was made with a cystotome and capsulorrhexis forceps.  Balanced salt solution was used to hydrodissect and hydrodelineate the nucleus.   Phacoemulsification was then used in stop and chop fashion to remove the lens nucleus and epinucleus.  The remaining cortex was then removed  using the irrigation and aspiration handpiece. Healon was then placed into the capsular bag to distend it for lens placement.  A lens was then injected into the capsular bag.  The remaining viscoelastic was aspirated.   Wounds were hydrated with balanced salt solution.  The anterior chamber was inflated to a physiologic pressure with balanced salt solution.   Intracameral vigamox 0.1 mL undiluted was injected into the eye and a drop placed onto the ocular surface.  No wound leaks were noted.  The patient was taken to the recovery room in stable condition without complications of anesthesia or surgery  Benay Pillow 11/14/2019, 9:42 AM

## 2019-11-14 NOTE — H&P (Signed)

## 2019-11-14 NOTE — Anesthesia Procedure Notes (Addendum)
Procedure Name: MAC Date/Time: 11/14/2019 9:23 AM Performed by: Georga Bora, CRNA Pre-anesthesia Checklist: Patient identified, Emergency Drugs available, Suction available, Patient being monitored and Timeout performed Patient Re-evaluated:Patient Re-evaluated prior to induction Oxygen Delivery Method: Nasal cannula

## 2019-11-15 ENCOUNTER — Encounter: Payer: Self-pay | Admitting: *Deleted

## 2020-06-17 ENCOUNTER — Other Ambulatory Visit: Payer: Medicare PPO

## 2020-06-17 DIAGNOSIS — Z20822 Contact with and (suspected) exposure to covid-19: Secondary | ICD-10-CM

## 2020-06-21 LAB — NOVEL CORONAVIRUS, NAA: SARS-CoV-2, NAA: NOT DETECTED

## 2020-06-28 ENCOUNTER — Ambulatory Visit (INDEPENDENT_AMBULATORY_CARE_PROVIDER_SITE_OTHER): Payer: Medicare PPO

## 2020-06-28 ENCOUNTER — Encounter: Payer: Self-pay | Admitting: Podiatry

## 2020-06-28 ENCOUNTER — Ambulatory Visit: Payer: Medicare PPO | Admitting: Podiatry

## 2020-06-28 ENCOUNTER — Other Ambulatory Visit: Payer: Self-pay

## 2020-06-28 DIAGNOSIS — M7751 Other enthesopathy of right foot: Secondary | ICD-10-CM

## 2020-06-28 DIAGNOSIS — M19071 Primary osteoarthritis, right ankle and foot: Secondary | ICD-10-CM

## 2020-06-28 DIAGNOSIS — M778 Other enthesopathies, not elsewhere classified: Secondary | ICD-10-CM

## 2020-06-29 ENCOUNTER — Encounter: Payer: Self-pay | Admitting: Podiatry

## 2020-06-29 NOTE — Progress Notes (Signed)
Subjective:  Patient ID: Diana Sullivan, female    DOB: [birth year],  MRN: [id number]  Chief Complaint  Patient presents with   Foot Pain    Patient presents today for right top midfoot pain x 1 week    85 y.o. female presents with the above complaint.  Patient presents with complaint to the right midfoot pain.  Patient states been going for a week as progressing and worse.  Patient states it was correlating with cold weather as well.  Patient does have history of arthritis.  She also states that she has tried resting it with Tylenol which has not helped.  She states that the sharp electric pain tender to touch.  She denies any other acute complaints.  She would like to discuss treatment options.  She has not seen anyone else prior to see me for this particular thing.   Review of Systems: Negative except as noted in the HPI. Denies N/V/F/Ch.  Past Medical History:  Diagnosis Date   Anginal pain ([place])    chest pain occasionally, checked out [facility] (no Issues)   Arthritis    hands   Bronchitis    Complication of anesthesia    patient stated"They had trouble waking me up"   Elevated lipids    Gout    Headache    once every 2 or 3 weeks   History of recent fall    shoulder and knee pain, left side   Hypertension    essential hypertension-controlled on meds   Mitral valve disorder    Muscular dystrophy ([place])    Patient stated someone told her 25 years ago "that she had the gene for it" No issues   MVP (mitral valve prolapse)    Pre-diabetes    watching diet   Wears partial dentures    upper and lower    Current Outpatient Medications:    Blood Glucose Monitoring Suppl (ONE TOUCH ULTRA 2) w/Device KIT, , Disp: , Rfl:    colchicine 0.6 MG tablet, Take 1 tablet (0.6 mg total) by mouth daily. Take ii initially. Then one daily. (Patient not taking: Reported on 11/02/2019), Disp: 10 tablet, Rfl: 0   EPINEPHrine 0.3 mg/0.3 mL IJ SOAJ injection,  Reported on 05/30/2015, Disp: , Rfl:    fexofenadine (ALLEGRA) 180 MG tablet, Take 180 mg by mouth daily., Disp: , Rfl:    metoprolol succinate (TOPROL-XL) 50 MG 24 hr tablet, 50 mg. , Disp: , Rfl:    naproxen (NAPROSYN) 500 MG tablet, , Disp: , Rfl:    olmesartan-hydrochlorothiazide (BENICAR HCT) 20-12.5 MG tablet, Take 1 tablet by mouth daily. , Disp: , Rfl:    ONE TOUCH ULTRA TEST test strip, , Disp: , Rfl:   Social History   Tobacco Use  Smoking Status Former Smoker   Types: Cigarettes  Smokeless Tobacco Never Used  Tobacco Comment   QUIT 25 YEARS AGO    Allergies  Allergen Reactions   Azithromycin Rash   Iodinated Diagnostic Agents     Other reaction(s): Other (See Comments) Other Reaction: Other reaction   Shellfish Allergy Itching and Swelling   Objective:  There were no vitals filed for this visit. There is no height or weight on file to calculate BMI. Constitutional Well developed. Well nourished.  Vascular Dorsalis pedis pulses palpable bilaterally. Posterior tibial pulses palpable bilaterally. Capillary refill normal to all digits.  No cyanosis or clubbing noted. Pedal hair growth normal.  Neurologic Normal speech. Oriented to person, place,  Epicritic sensation to light touch grossly present bilaterally.  °Dermatologic Nails well groomed and normal in appearance. °No open wounds. °No skin lesions.  °Orthopedic:  Pain on palpation to the right dorsal midfoot with most localized pain to the first tarsometatarsal joint.  No pain with range of motion of the second third fourth fifth tarsometatarsal joint.  No pain with resisted dorsiflexion of the digits as well as plantar flexion of the digits ruling out extensor tendinitis.  ° °Radiographs: 3 views of skeletally mature adult right foot: Midfoot arthritis noted.  Previous hardware noted which appears to be intact without any signs of backing or loosening.  No other bony abnormalities identified.   Osteoarthrosis noted to the first metatarsophalangeal joint as well as fifth metatarsal phalangeal joint. ° °Assessment:  ° °1. Arthritis of midtarsal joint of right foot   °2. Capsulitis of right foot   ° °Plan:  °Patient was evaluated and treated and all questions answered. ° °Right midfoot arthritis with underlying capsulitis °-I explained to patient the etiology of arthritis and various treatment options were discussed.  I discussed with the patient that given his cold weather out there with weather changes arthritis can flareup and is likely leading to pain.  Given the amount of pain that she is having I believe she will benefit from a steroid injection.  Patient agrees with the plan would like to proceed with a steroid injection °-A steroid injection was performed at right dorsal midfoot using 1% plain Lidocaine and 10 mg of Kenalog. This was well tolerated. ° ° °No follow-ups on file.  °

## 2020-07-23 IMAGING — CT CT HEAD W/O CM
3 series · 15 of 46 positions shown, 18 images · non-contrast
Comparison: None.

CLINICAL DATA: Right basal ganglia hemorrhage

EXAM:
CT HEAD WITHOUT CONTRAST
TECHNIQUE: Contiguous axial images were obtained from the base of the skull
through the vertex without intravenous contrast.

[Series 2: head wo · axial · 0.41mm/px · z∈[-86,+34]mm · 9 of 29 slices shown, 12 images]
[im 3/29  brain]
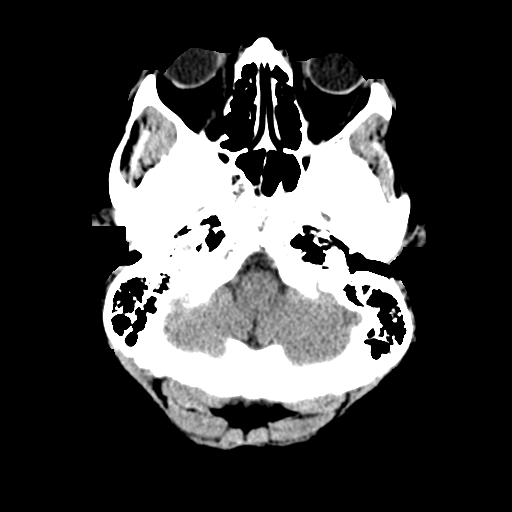
[im 3/29  bone]
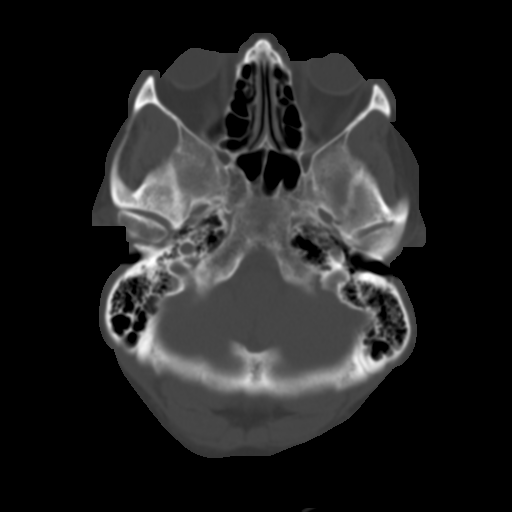
[im 6/29  brain]
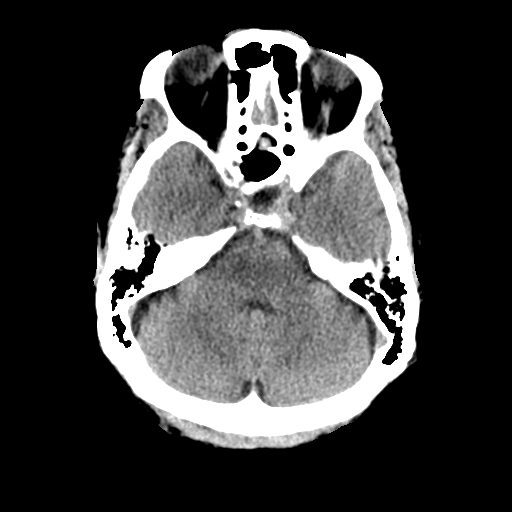
[im 9/29  brain]
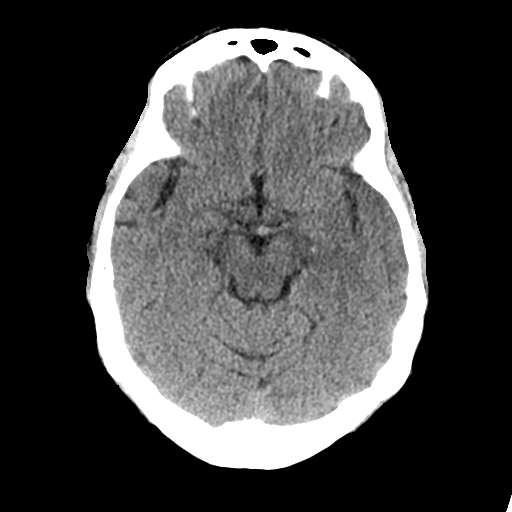
[im 12/29  brain]
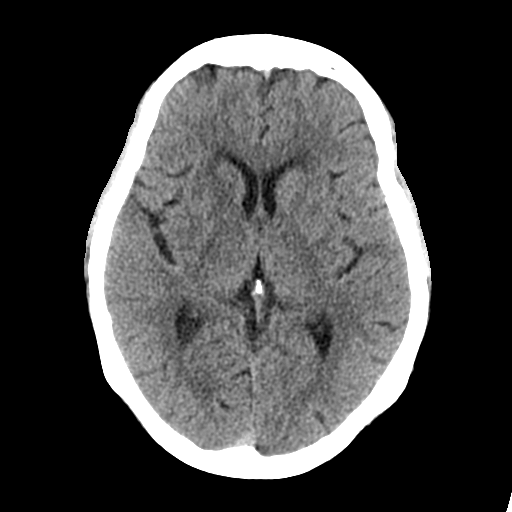
[im 15/29  brain]
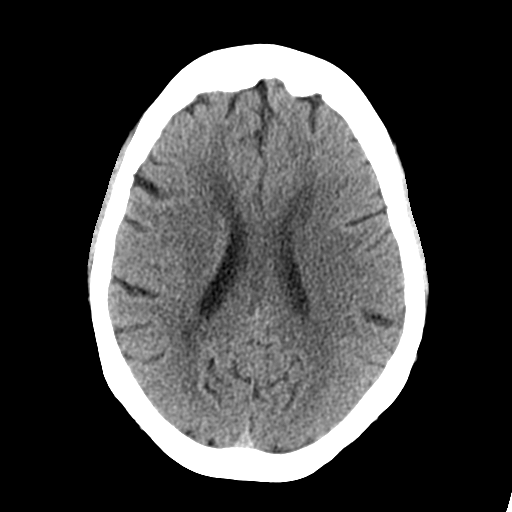
[im 15/29  bone]
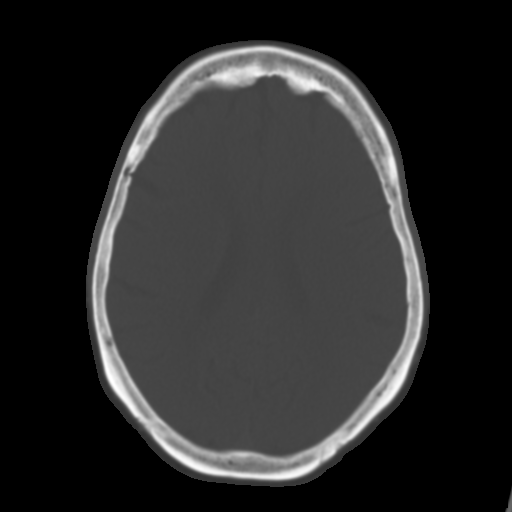
[im 18/29  brain]
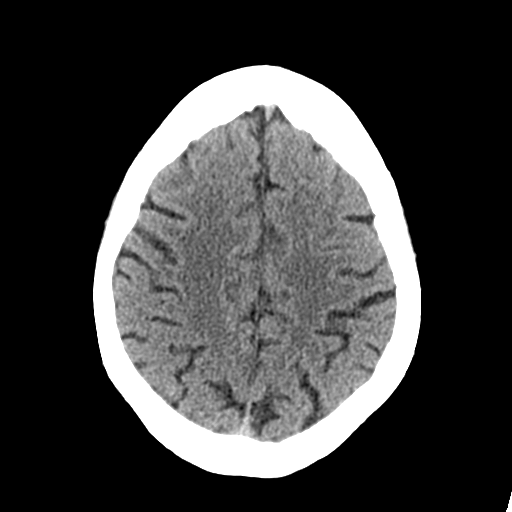
[im 21/29  brain]
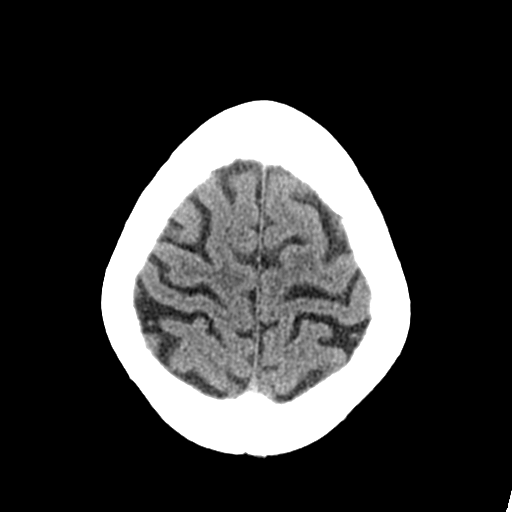
[im 24/29  brain]
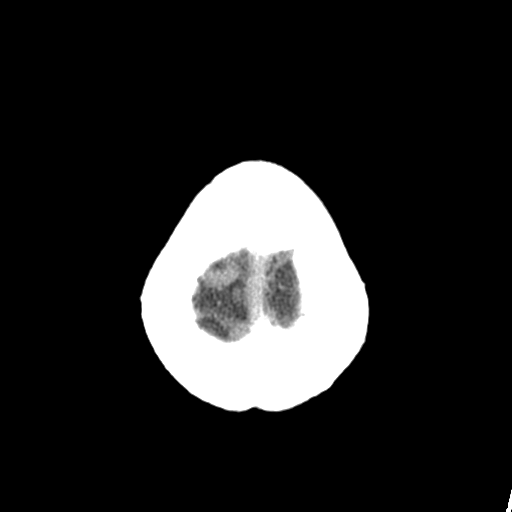
[im 27/29  brain]
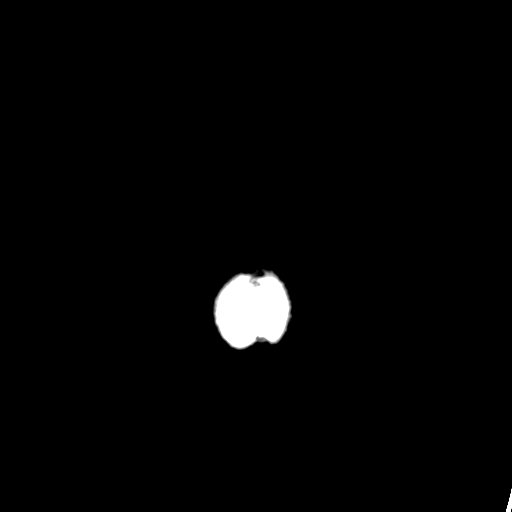
[im 27/29  bone]
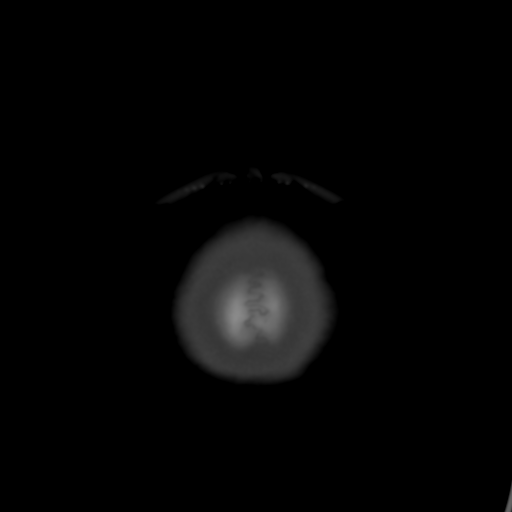

[Series 4: coronal soft tissue · coronal · 0.28mm/px · 3 of 61 slices shown]
[im 21/61  brain]
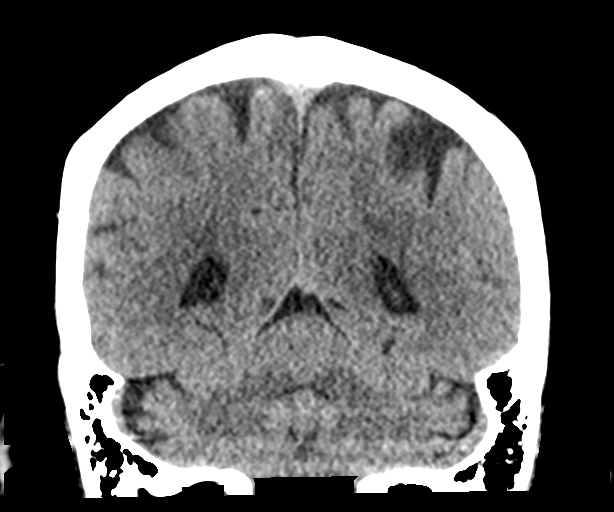
[im 27/61  brain]
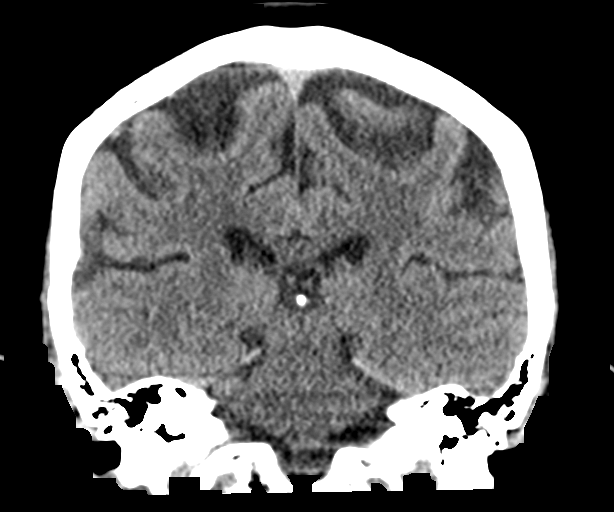
[im 34/61  brain]
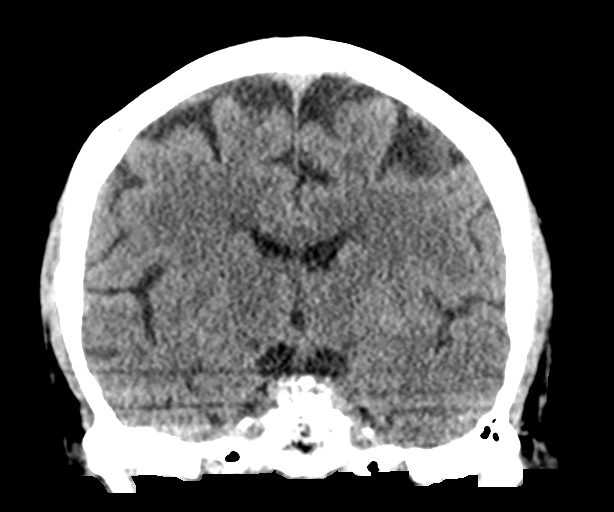

[Series 5: sagittal soft tissue · sagittal · 0.28mm/px · 3 of 51 slices shown]
[im 17/51  brain]
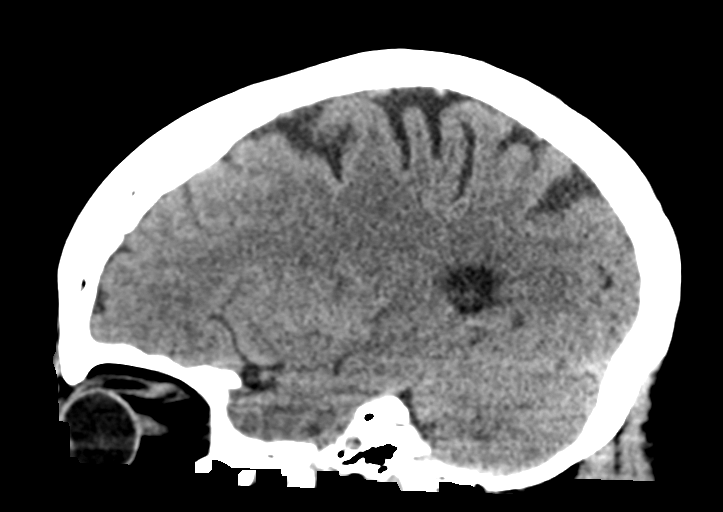
[im 26/51  brain]
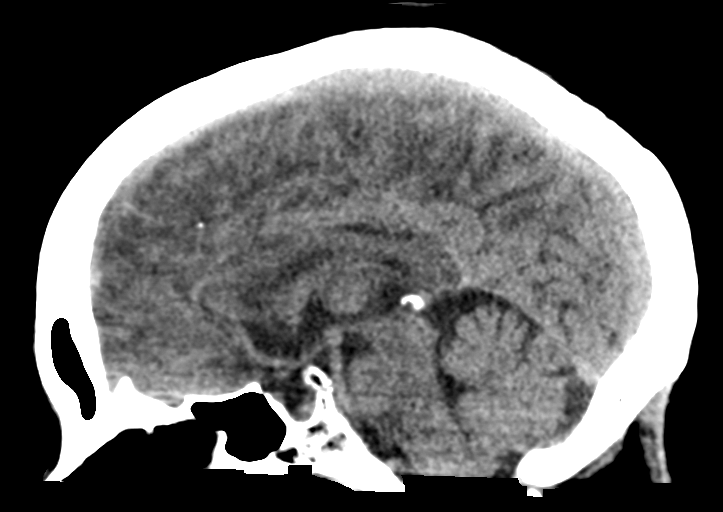
[im 34/51  brain]
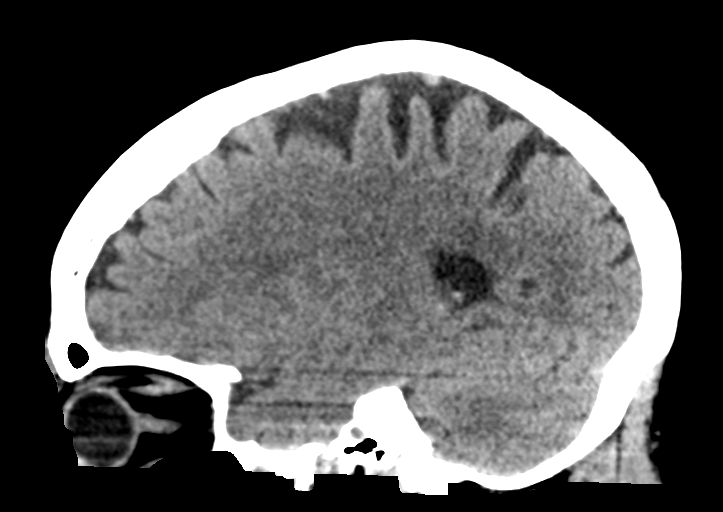

[15 of 46 positions shown; findings below may reference images not displayed]

FINDINGS: Brain: Again noted is a small area of increased density in the right
basal ganglia/internal capsule region, similar to prior study.
Cannot exclude small hemorrhage. Chronic microvascular changes
throughout the deep white matter. Mild age related atrophy. No
hydrocephalus or acute infarction.

Vascular: No hyperdense vessel or unexpected calcification.

Skull: No acute calvarial abnormality.

Sinuses/Orbits: Visualized paranasal sinuses and mastoids clear.
Orbital soft tissues unremarkable.

Other: None
IMPRESSION: Small hyperdense area within the right basal ganglia is stable since
prior study. Cannot exclude small hemorrhage.

## 2020-07-23 IMAGING — CR DG SHOULDER 2+V*R*
1 series · 3 of 3 positions shown · non-contrast
Comparison: None.

CLINICAL DATA: Right shoulder pain

EXAM:
RIGHT SHOULDER - 2+ VIEW

[Series 1: dg shoulder right · 0.14mm/px · 3 of 3 slices shown]
[im 1/3]
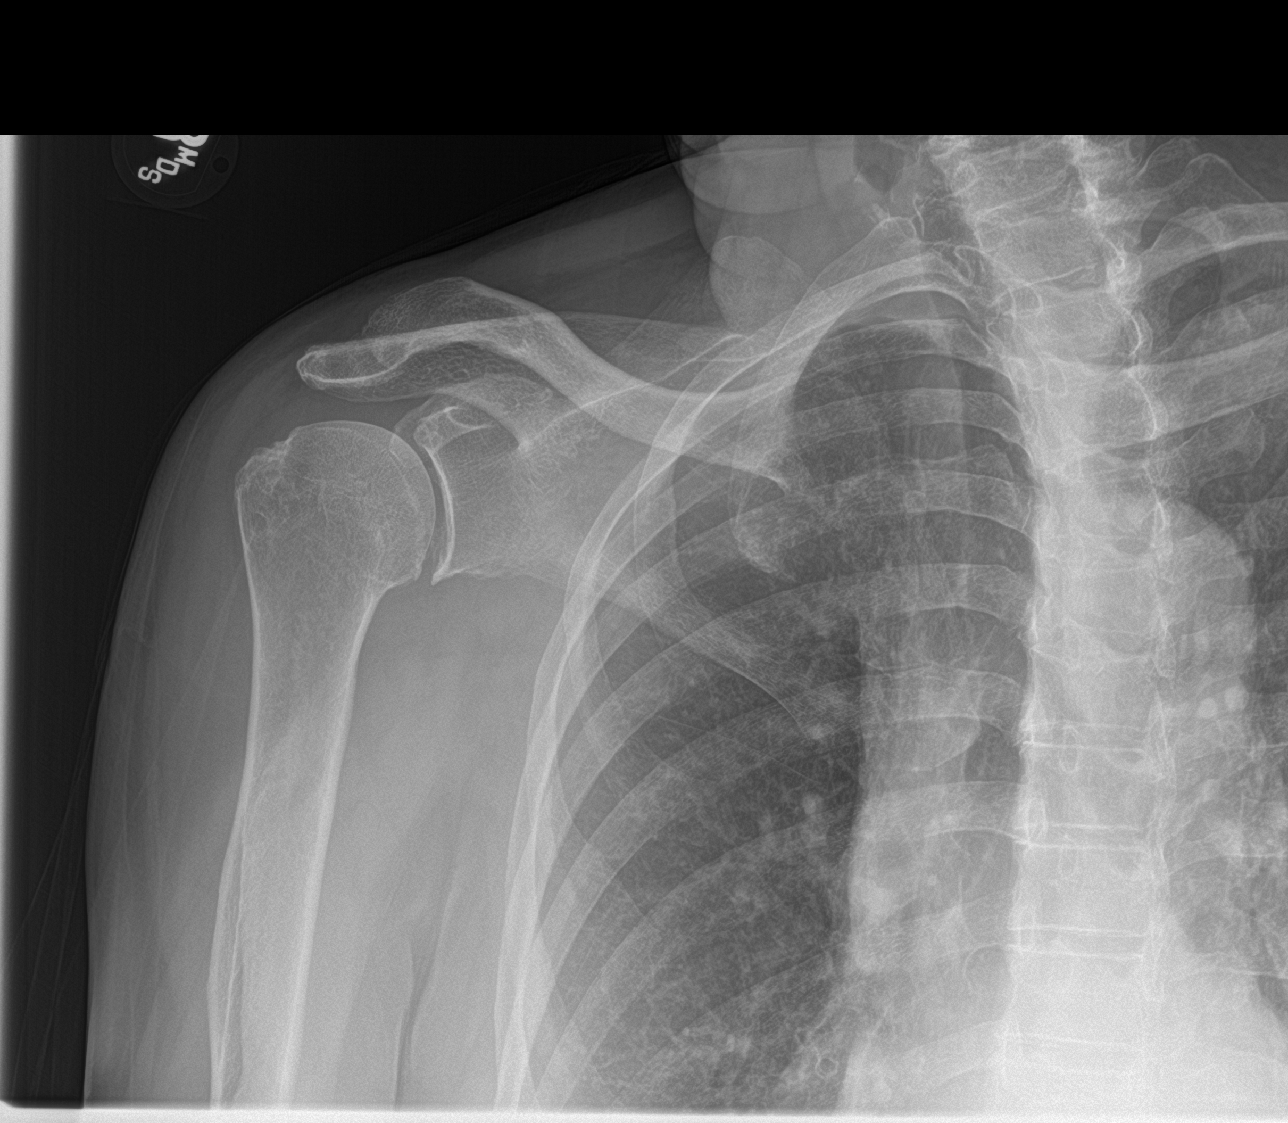
[im 2/3]
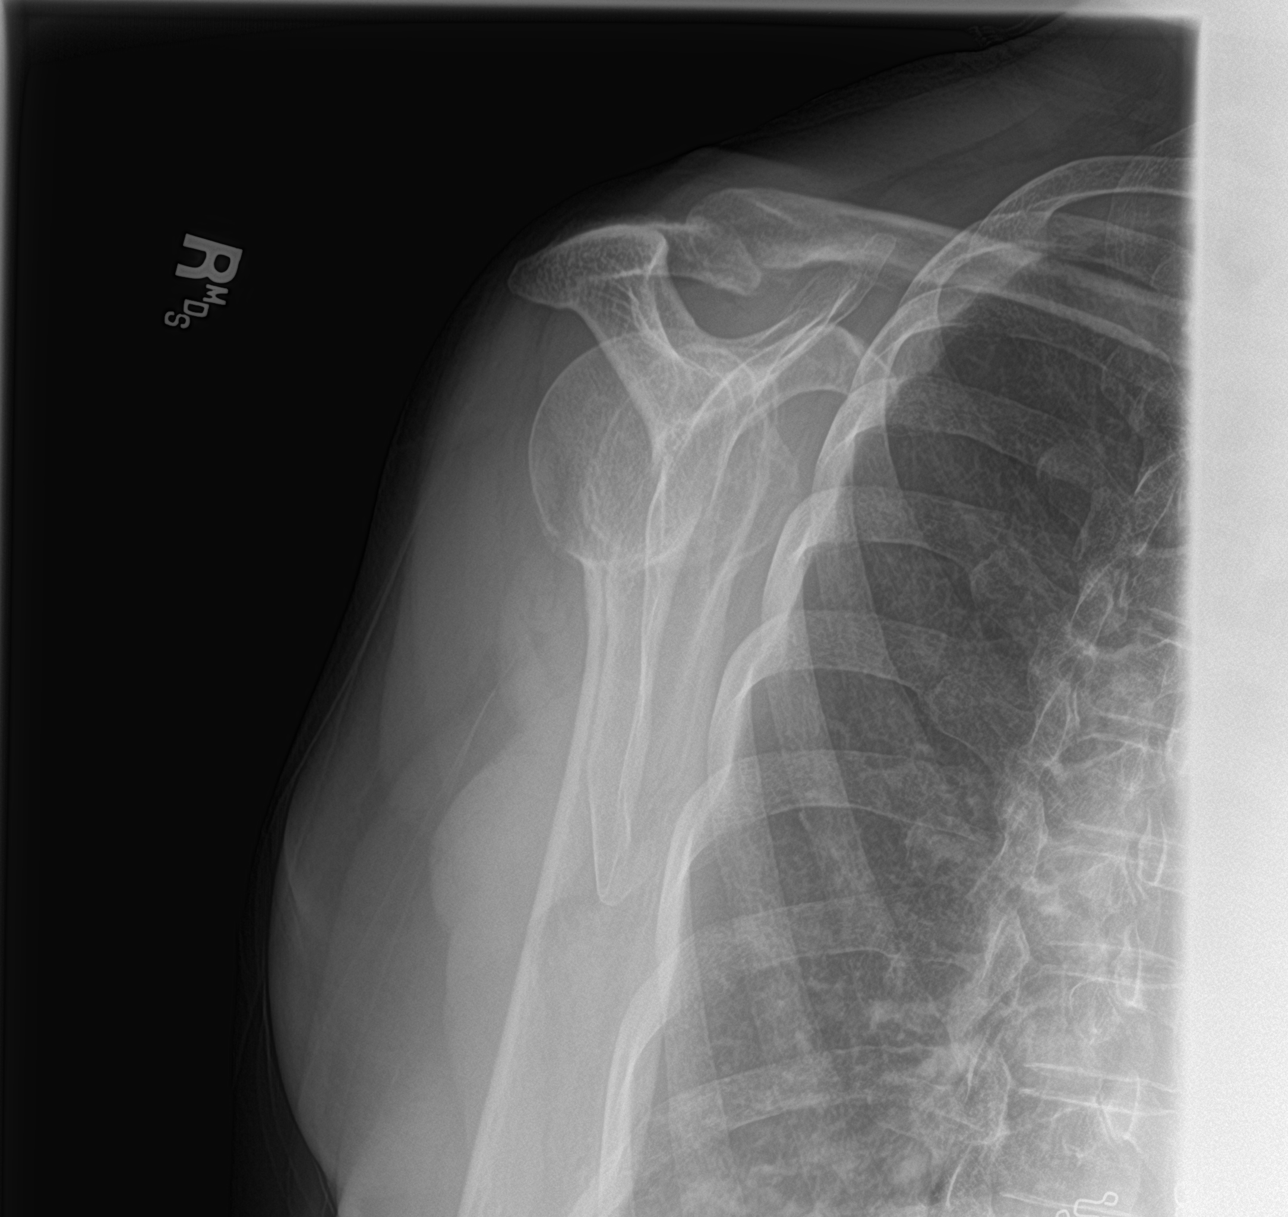
[im 3/3]
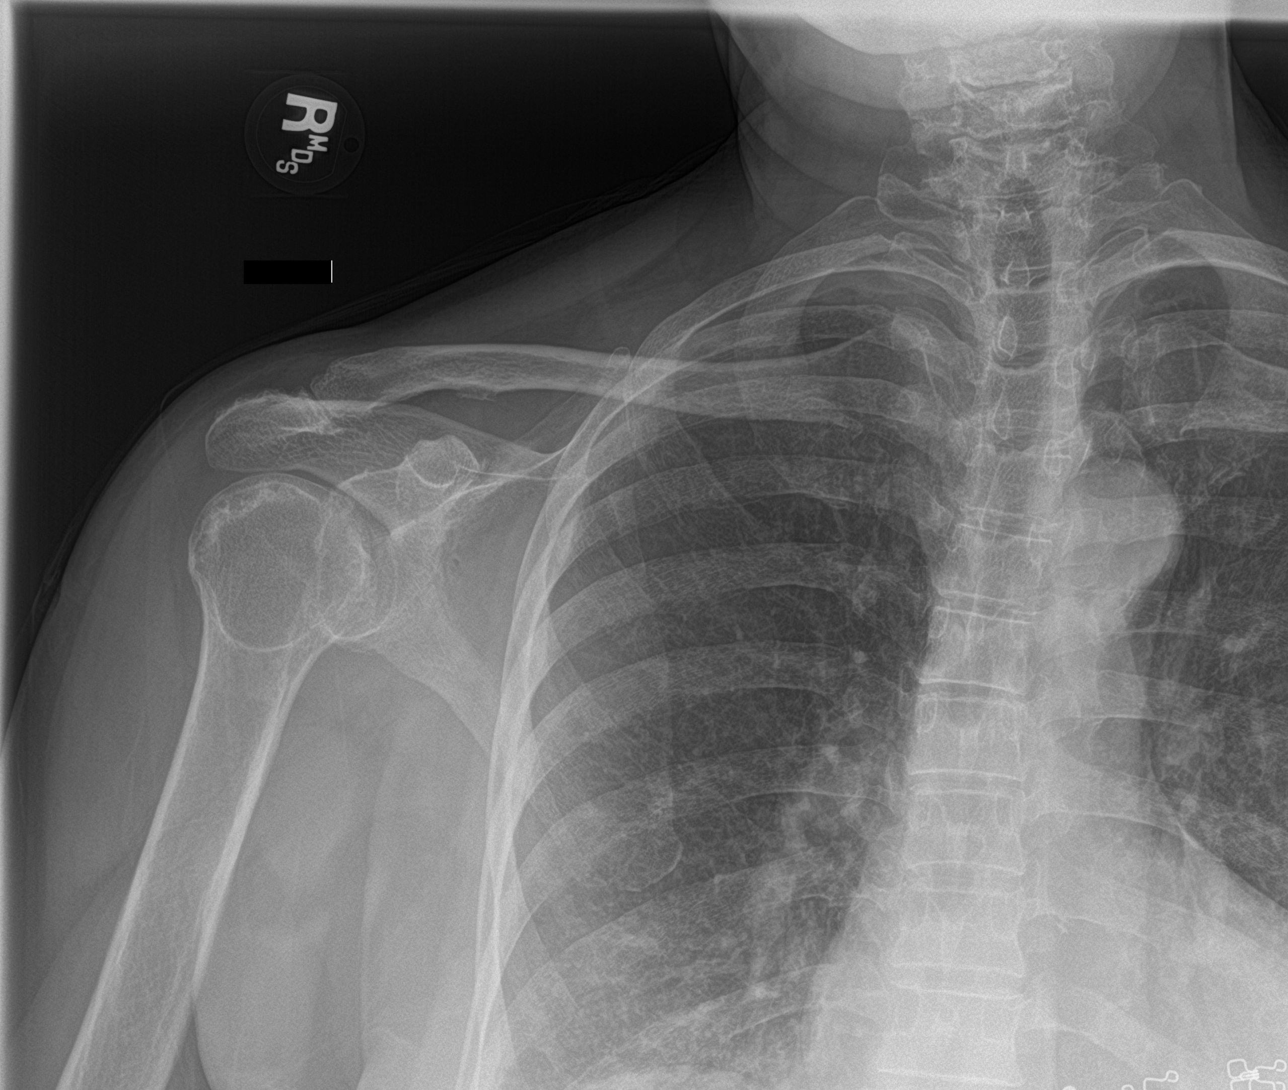

[3 of 3 positions shown; findings below may reference images not displayed]

FINDINGS: No fracture or dislocation is seen.

Mild degenerative changes of the glenohumeral joint. Mild
irregularity of the posterolateral humeral head may reflect a
chronic deformity, but is not favored to be acute.

The visualized soft tissues are unremarkable.

Visualized right lung is clear.
IMPRESSION: Negative.

## 2020-07-26 ENCOUNTER — Ambulatory Visit: Payer: Medicare PPO | Admitting: Podiatry

## 2020-08-06 ENCOUNTER — Ambulatory Visit: Payer: Medicare PPO | Admitting: Podiatry

## 2020-08-06 ENCOUNTER — Encounter: Payer: Self-pay | Admitting: Podiatry

## 2020-08-06 ENCOUNTER — Other Ambulatory Visit: Payer: Self-pay

## 2020-08-06 DIAGNOSIS — M76811 Anterior tibial syndrome, right leg: Secondary | ICD-10-CM | POA: Diagnosis not present

## 2020-08-06 MED ORDER — METHYLPREDNISOLONE 4 MG PO TBPK
ORAL_TABLET | ORAL | 0 refills | Status: DC
Start: 2020-08-06 — End: 2020-09-03

## 2020-08-06 MED ORDER — DEXAMETHASONE SODIUM PHOSPHATE 120 MG/30ML IJ SOLN
2.0000 mg | Freq: Once | INTRAMUSCULAR | Status: AC
  Administered 2020-08-06: 2 mg via INTRA_ARTICULAR

## 2020-08-06 NOTE — Progress Notes (Signed)
She presents today states that she is having pain across the first metatarsophalangeal joint area extending to the insertion of the tibialis anterior tendon and the posterior tibial tendon.  There is mild swelling and here she is noted.  She states is been red hot swollen and burning and feels like a dagger.  She denies any changes in her past medical history medications allergies surgeries and social history.  Objective: Vital signs are stable she alert oriented x3 pulses are strongly palpable right.  She has pain on frontal plane range of motion of the first metatarsal medial cuneiform joint she also has pain on palpation of the tibialis anterior tendon particularly at its insertion site.  She also has some tenderness on palpation of the posterior tibial tendon at its insertion site of the navicular tuberosity.  Assessment cannot rule out gouty capsulitis at this point.  I injected the area with dexamethasone at the point of maximal tenderness distalmost aspect of the tibialis anterior tendon.  Also start her on a Medrol Dosepak.  If this does not alleviate her symptoms in the next couple of weeks we will start blood work for arthritic testing.

## 2020-09-03 ENCOUNTER — Ambulatory Visit: Payer: Medicare PPO | Admitting: Podiatry

## 2020-09-03 ENCOUNTER — Encounter: Payer: Self-pay | Admitting: Podiatry

## 2020-09-03 ENCOUNTER — Other Ambulatory Visit: Payer: Self-pay

## 2020-09-03 DIAGNOSIS — M76811 Anterior tibial syndrome, right leg: Secondary | ICD-10-CM | POA: Diagnosis not present

## 2020-09-03 DIAGNOSIS — M778 Other enthesopathies, not elsewhere classified: Secondary | ICD-10-CM

## 2020-09-03 NOTE — Progress Notes (Signed)
She presents today for follow-up of tibialis anterior tendinitis.  States that is doing so much better she is very happy with the outcome.  Objective: Vital signs are stable she alert oriented x3 she has some swelling along the insertion site of the tibialis anterior tendon but is not tender she says.  She has good inversion and dorsiflexion.  No pain.  Assessment: Well-healing tibialis anterior tendinitis.  Plan: Follow-up with her on an as-needed basis.

## 2020-11-13 DIAGNOSIS — M1990 Unspecified osteoarthritis, unspecified site: Secondary | ICD-10-CM | POA: Diagnosis not present

## 2020-11-13 DIAGNOSIS — Z7722 Contact with and (suspected) exposure to environmental tobacco smoke (acute) (chronic): Secondary | ICD-10-CM | POA: Diagnosis not present

## 2020-11-13 DIAGNOSIS — Z809 Family history of malignant neoplasm, unspecified: Secondary | ICD-10-CM | POA: Diagnosis not present

## 2020-11-13 DIAGNOSIS — Z8249 Family history of ischemic heart disease and other diseases of the circulatory system: Secondary | ICD-10-CM | POA: Diagnosis not present

## 2020-11-13 DIAGNOSIS — Z87891 Personal history of nicotine dependence: Secondary | ICD-10-CM | POA: Diagnosis not present

## 2020-11-13 DIAGNOSIS — R32 Unspecified urinary incontinence: Secondary | ICD-10-CM | POA: Diagnosis not present

## 2020-11-13 DIAGNOSIS — I10 Essential (primary) hypertension: Secondary | ICD-10-CM | POA: Diagnosis not present

## 2020-11-26 ENCOUNTER — Other Ambulatory Visit: Payer: Self-pay | Admitting: Internal Medicine

## 2020-11-26 DIAGNOSIS — Z1231 Encounter for screening mammogram for malignant neoplasm of breast: Secondary | ICD-10-CM

## 2020-11-27 ENCOUNTER — Ambulatory Visit
Admission: RE | Admit: 2020-11-27 | Discharge: 2020-11-27 | Disposition: A | Discharge status: Home / Self Care | Payer: Medicare PPO | Source: Ambulatory Visit | Attending: Internal Medicine | Admitting: Internal Medicine

## 2020-11-27 ENCOUNTER — Other Ambulatory Visit: Payer: Self-pay

## 2020-11-27 DIAGNOSIS — Z1231 Encounter for screening mammogram for malignant neoplasm of breast: Secondary | ICD-10-CM | POA: Diagnosis not present

## 2020-11-30 DIAGNOSIS — L2084 Intrinsic (allergic) eczema: Secondary | ICD-10-CM | POA: Diagnosis not present

## 2020-12-04 DIAGNOSIS — E785 Hyperlipidemia, unspecified: Secondary | ICD-10-CM | POA: Diagnosis not present

## 2020-12-04 DIAGNOSIS — E749 Disorder of carbohydrate metabolism, unspecified: Secondary | ICD-10-CM | POA: Diagnosis not present

## 2020-12-13 DIAGNOSIS — E785 Hyperlipidemia, unspecified: Secondary | ICD-10-CM | POA: Diagnosis not present

## 2020-12-13 DIAGNOSIS — I341 Nonrheumatic mitral (valve) prolapse: Secondary | ICD-10-CM | POA: Diagnosis not present

## 2020-12-13 DIAGNOSIS — Z9889 Other specified postprocedural states: Secondary | ICD-10-CM | POA: Diagnosis not present

## 2020-12-13 DIAGNOSIS — I10 Essential (primary) hypertension: Secondary | ICD-10-CM | POA: Diagnosis not present

## 2020-12-13 DIAGNOSIS — G4733 Obstructive sleep apnea (adult) (pediatric): Secondary | ICD-10-CM | POA: Diagnosis not present

## 2020-12-13 DIAGNOSIS — R079 Chest pain, unspecified: Secondary | ICD-10-CM | POA: Diagnosis not present

## 2020-12-13 DIAGNOSIS — Z789 Other specified health status: Secondary | ICD-10-CM | POA: Diagnosis not present

## 2020-12-14 DIAGNOSIS — J301 Allergic rhinitis due to pollen: Secondary | ICD-10-CM | POA: Diagnosis not present

## 2020-12-14 DIAGNOSIS — K219 Gastro-esophageal reflux disease without esophagitis: Secondary | ICD-10-CM | POA: Diagnosis not present

## 2020-12-14 DIAGNOSIS — M5430 Sciatica, unspecified side: Secondary | ICD-10-CM | POA: Diagnosis not present

## 2020-12-14 DIAGNOSIS — N3941 Urge incontinence: Secondary | ICD-10-CM | POA: Diagnosis not present

## 2020-12-14 DIAGNOSIS — M5412 Radiculopathy, cervical region: Secondary | ICD-10-CM | POA: Diagnosis not present

## 2020-12-14 DIAGNOSIS — G4709 Other insomnia: Secondary | ICD-10-CM | POA: Diagnosis not present

## 2020-12-14 DIAGNOSIS — M7532 Calcific tendinitis of left shoulder: Secondary | ICD-10-CM | POA: Diagnosis not present

## 2020-12-14 DIAGNOSIS — E785 Hyperlipidemia, unspecified: Secondary | ICD-10-CM | POA: Diagnosis not present

## 2020-12-14 DIAGNOSIS — I10 Essential (primary) hypertension: Secondary | ICD-10-CM | POA: Diagnosis not present

## 2021-02-18 DIAGNOSIS — M19012 Primary osteoarthritis, left shoulder: Secondary | ICD-10-CM | POA: Diagnosis not present

## 2021-02-19 DIAGNOSIS — M5430 Sciatica, unspecified side: Secondary | ICD-10-CM | POA: Diagnosis not present

## 2021-02-19 DIAGNOSIS — M5412 Radiculopathy, cervical region: Secondary | ICD-10-CM | POA: Diagnosis not present

## 2021-02-19 DIAGNOSIS — N3941 Urge incontinence: Secondary | ICD-10-CM | POA: Diagnosis not present

## 2021-02-19 DIAGNOSIS — N390 Urinary tract infection, site not specified: Secondary | ICD-10-CM | POA: Diagnosis not present

## 2021-02-19 DIAGNOSIS — E749 Disorder of carbohydrate metabolism, unspecified: Secondary | ICD-10-CM | POA: Diagnosis not present

## 2021-02-19 DIAGNOSIS — K219 Gastro-esophageal reflux disease without esophagitis: Secondary | ICD-10-CM | POA: Diagnosis not present

## 2021-02-19 DIAGNOSIS — I10 Essential (primary) hypertension: Secondary | ICD-10-CM | POA: Diagnosis not present

## 2021-03-01 DIAGNOSIS — Z1212 Encounter for screening for malignant neoplasm of rectum: Secondary | ICD-10-CM | POA: Diagnosis not present

## 2021-03-01 DIAGNOSIS — Z1211 Encounter for screening for malignant neoplasm of colon: Secondary | ICD-10-CM | POA: Diagnosis not present

## 2021-03-11 LAB — COLOGUARD: COLOGUARD: POSITIVE — AB

## 2021-03-18 DIAGNOSIS — I10 Essential (primary) hypertension: Secondary | ICD-10-CM | POA: Diagnosis not present

## 2021-03-18 DIAGNOSIS — E749 Disorder of carbohydrate metabolism, unspecified: Secondary | ICD-10-CM | POA: Diagnosis not present

## 2021-03-18 DIAGNOSIS — E785 Hyperlipidemia, unspecified: Secondary | ICD-10-CM | POA: Diagnosis not present

## 2021-03-18 DIAGNOSIS — E559 Vitamin D deficiency, unspecified: Secondary | ICD-10-CM | POA: Diagnosis not present

## 2021-03-19 DIAGNOSIS — G4709 Other insomnia: Secondary | ICD-10-CM | POA: Diagnosis not present

## 2021-03-19 DIAGNOSIS — M7532 Calcific tendinitis of left shoulder: Secondary | ICD-10-CM | POA: Diagnosis not present

## 2021-03-19 DIAGNOSIS — M5412 Radiculopathy, cervical region: Secondary | ICD-10-CM | POA: Diagnosis not present

## 2021-03-19 DIAGNOSIS — E785 Hyperlipidemia, unspecified: Secondary | ICD-10-CM | POA: Diagnosis not present

## 2021-03-19 DIAGNOSIS — I10 Essential (primary) hypertension: Secondary | ICD-10-CM | POA: Diagnosis not present

## 2021-03-19 DIAGNOSIS — M5430 Sciatica, unspecified side: Secondary | ICD-10-CM | POA: Diagnosis not present

## 2021-03-19 DIAGNOSIS — J301 Allergic rhinitis due to pollen: Secondary | ICD-10-CM | POA: Diagnosis not present

## 2021-03-19 DIAGNOSIS — N3941 Urge incontinence: Secondary | ICD-10-CM | POA: Diagnosis not present

## 2021-03-19 DIAGNOSIS — K219 Gastro-esophageal reflux disease without esophagitis: Secondary | ICD-10-CM | POA: Diagnosis not present

## 2021-03-27 DIAGNOSIS — Z23 Encounter for immunization: Secondary | ICD-10-CM | POA: Diagnosis not present

## 2021-04-24 DIAGNOSIS — K219 Gastro-esophageal reflux disease without esophagitis: Secondary | ICD-10-CM | POA: Diagnosis not present

## 2021-04-24 DIAGNOSIS — R195 Other fecal abnormalities: Secondary | ICD-10-CM | POA: Diagnosis not present

## 2021-05-16 DIAGNOSIS — R001 Bradycardia, unspecified: Secondary | ICD-10-CM | POA: Diagnosis not present

## 2021-05-16 DIAGNOSIS — Z87891 Personal history of nicotine dependence: Secondary | ICD-10-CM | POA: Diagnosis not present

## 2021-05-16 DIAGNOSIS — Z01818 Encounter for other preprocedural examination: Secondary | ICD-10-CM | POA: Diagnosis not present

## 2021-05-16 DIAGNOSIS — I341 Nonrheumatic mitral (valve) prolapse: Secondary | ICD-10-CM | POA: Diagnosis not present

## 2021-05-16 DIAGNOSIS — M19012 Primary osteoarthritis, left shoulder: Secondary | ICD-10-CM | POA: Diagnosis not present

## 2021-05-16 DIAGNOSIS — T8859XD Other complications of anesthesia, subsequent encounter: Secondary | ICD-10-CM | POA: Diagnosis not present

## 2021-05-16 DIAGNOSIS — I10 Essential (primary) hypertension: Secondary | ICD-10-CM | POA: Diagnosis not present

## 2021-06-04 ENCOUNTER — Encounter: Payer: Self-pay | Admitting: Internal Medicine

## 2021-06-05 ENCOUNTER — Encounter
Admission: RE | Disposition: A | Discharge status: Home / Self Care | Payer: Self-pay | Source: Home / Self Care | Attending: Internal Medicine

## 2021-06-05 ENCOUNTER — Ambulatory Visit
Admission: RE | Admit: 2021-06-05 | Discharge: 2021-06-05 | Disposition: A | Discharge status: Home / Self Care | Payer: Medicare PPO | Attending: Internal Medicine | Admitting: Internal Medicine

## 2021-06-05 ENCOUNTER — Other Ambulatory Visit: Payer: Self-pay

## 2021-06-05 ENCOUNTER — Ambulatory Visit: Payer: Medicare PPO | Admitting: Anesthesiology

## 2021-06-05 ENCOUNTER — Encounter: Payer: Self-pay | Admitting: Internal Medicine

## 2021-06-05 DIAGNOSIS — I10 Essential (primary) hypertension: Secondary | ICD-10-CM | POA: Diagnosis not present

## 2021-06-05 DIAGNOSIS — K219 Gastro-esophageal reflux disease without esophagitis: Secondary | ICD-10-CM | POA: Diagnosis not present

## 2021-06-05 DIAGNOSIS — I208 Other forms of angina pectoris: Secondary | ICD-10-CM | POA: Insufficient documentation

## 2021-06-05 DIAGNOSIS — I341 Nonrheumatic mitral (valve) prolapse: Secondary | ICD-10-CM | POA: Diagnosis not present

## 2021-06-05 DIAGNOSIS — K640 First degree hemorrhoids: Secondary | ICD-10-CM | POA: Diagnosis not present

## 2021-06-05 DIAGNOSIS — K635 Polyp of colon: Secondary | ICD-10-CM | POA: Diagnosis not present

## 2021-06-05 DIAGNOSIS — Z87898 Personal history of other specified conditions: Secondary | ICD-10-CM | POA: Insufficient documentation

## 2021-06-05 DIAGNOSIS — R195 Other fecal abnormalities: Secondary | ICD-10-CM | POA: Diagnosis not present

## 2021-06-05 DIAGNOSIS — Q438 Other specified congenital malformations of intestine: Secondary | ICD-10-CM | POA: Insufficient documentation

## 2021-06-05 DIAGNOSIS — R519 Headache, unspecified: Secondary | ICD-10-CM | POA: Insufficient documentation

## 2021-06-05 DIAGNOSIS — D125 Benign neoplasm of sigmoid colon: Secondary | ICD-10-CM | POA: Diagnosis not present

## 2021-06-05 DIAGNOSIS — G4730 Sleep apnea, unspecified: Secondary | ICD-10-CM | POA: Insufficient documentation

## 2021-06-05 DIAGNOSIS — R7303 Prediabetes: Secondary | ICD-10-CM | POA: Insufficient documentation

## 2021-06-05 DIAGNOSIS — G709 Myoneural disorder, unspecified: Secondary | ICD-10-CM | POA: Diagnosis not present

## 2021-06-05 DIAGNOSIS — K649 Unspecified hemorrhoids: Secondary | ICD-10-CM | POA: Diagnosis not present

## 2021-06-05 HISTORY — DX: Polyneuropathy, unspecified: G62.9

## 2021-06-05 HISTORY — PX: COLONOSCOPY: SHX5424

## 2021-06-05 SURGERY — COLONOSCOPY
Anesthesia: General

## 2021-06-05 MED ORDER — PROPOFOL 500 MG/50ML IV EMUL
INTRAVENOUS | Status: DC | PRN
  Administered 2021-06-05: 130 ug/kg/min via INTRAVENOUS

## 2021-06-05 MED ORDER — SODIUM CHLORIDE 0.9 % IV SOLN: INTRAVENOUS | Status: DC

## 2021-06-05 MED ORDER — PROPOFOL 10 MG/ML IV BOLUS
INTRAVENOUS | Status: DC | PRN
  Administered 2021-06-05: 80 mg via INTRAVENOUS

## 2021-06-05 MED ORDER — EPHEDRINE SULFATE 50 MG/ML IJ SOLN
INTRAMUSCULAR | Status: DC | PRN
  Administered 2021-06-05: 10 mg via INTRAVENOUS

## 2021-06-05 MED ORDER — GLYCOPYRROLATE 0.2 MG/ML IJ SOLN
INTRAMUSCULAR | Status: DC | PRN
  Administered 2021-06-05: .2 mg via INTRAVENOUS

## 2021-06-05 NOTE — H&P (Signed)
Outpatient short stay form Pre-procedure 06/05/2021 10:18 AM Diana Sullivan K. Diana Sullivan, M.D.  Primary Physician: Diana Sullivan, M.D.  Reason for visit:  Positive Cologuard test  History of present illness:  Patient is a pleasant 85 y/o female/female presenting on referral from primary care provider for a POSITIVE Cologuard result. Patient denies change in bowel habits, rectal bleeding, weight loss or abdominal pain.       Current Facility-Administered Medications:    0.9 %  sodium chloride infusion, , Intravenous, Continuous, [place], Benay Pike, MD, Last Rate: 20 mL/hr at 06/05/21 0955, New Bag at 06/05/21 0955  Medications Prior to Admission  Medication Sig Dispense Refill Last Dose   diphenhydrAMINE (BENADRYL) 25 mg capsule Take 25 mg by mouth every 6 (six) hours as needed for itching.      losartan-hydrochlorothiazide (HYZAAR) 100-12.5 MG tablet Take 1 tablet by mouth daily.      metoprolol succinate (TOPROL-XL) 50 MG 24 hr tablet 50 mg.    06/05/2021   olmesartan-hydrochlorothiazide (BENICAR HCT) 20-12.5 MG tablet Take 1 tablet by mouth daily.    06/04/2021   omeprazole (PRILOSEC) 20 MG capsule Take 20 mg by mouth daily.      Blood Glucose Monitoring Suppl (ONE TOUCH ULTRA 2) w/Device KIT       colchicine 0.6 MG tablet Take 1 tablet (0.6 mg total) by mouth daily. Take ii initially. Then one daily. (Patient not taking: Reported on 11/02/2019) 10 tablet 0    EPINEPHrine 0.3 mg/0.3 mL IJ SOAJ injection Reported on 05/30/2015      fexofenadine (ALLEGRA) 180 MG tablet Take 180 mg by mouth daily.      naproxen (NAPROSYN) 500 MG tablet       ONE TOUCH ULTRA TEST test strip         Allergies  Allergen Reactions   Azithromycin Rash   Iodinated Contrast Media     Other reaction(s): Other (See Comments) Other Reaction: Other reaction   Shellfish Allergy Itching and Swelling     Past Medical History:  Diagnosis Date   Anginal pain ([place])    chest pain occasionally, checked out [facility] (no Issues)   Arthritis    hands   Bronchitis    Complication of anesthesia    patient stated"They had trouble waking me up"   Elevated lipids    Gout    Headache    once every 2 or 3 weeks   History of recent fall    shoulder and knee pain, left side   Hypertension    essential hypertension-controlled on meds   Mitral valve disorder    Muscular dystrophy ([place])    Patient stated someone told her 25 years ago "that she had the gene for it" No issues   MVP (mitral valve prolapse)    Neuropathy    Pre-diabetes    watching diet   Wears partial dentures    upper and lower    Review of systems:  Otherwise negative.    Physical Exam  Gen: Alert, oriented. Appears stated age.  HEENT: [place]/AT. PERRLA. Lungs: CTA, no wheezes. CV: RR nl S1, S2. Abd: soft, benign, no masses. BS+ Ext: No edema. Pulses 2+    Planned procedures: Proceed with colonoscopy. The patient understands the nature of the planned procedure, indications, risks, alternatives and potential complications including but not limited to bleeding, infection, perforation, damage to internal organs and possible oversedation/side effects from anesthesia. The patient agrees and gives consent to proceed.  Please refer to  procedure notes for findings, recommendations and patient disposition/instructions.     [name] K. Diana Sullivan, M.D. Gastroenterology 06/05/2021  10:18 AM

## 2021-06-05 NOTE — Transfer of Care (Signed)
Immediate Anesthesia Transfer of Care Note  Patient: Diana Sullivan  Procedure(s) Performed: COLONOSCOPY  Patient Location: PACU and Endoscopy Unit  Anesthesia Type:General  Level of Consciousness: drowsy  Airway & Oxygen Therapy: Patient Spontanous Breathing  Post-op Assessment: Report given to RN  Post vital signs: stable  Last Vitals:  Vitals Value Taken Time  BP    Temp    Pulse    Resp    SpO2      Last Pain:  Vitals:   06/05/21 0944  TempSrc: Temporal  PainSc: 2          Complications: No notable events documented.

## 2021-06-05 NOTE — Anesthesia Postprocedure Evaluation (Signed)
Anesthesia Post Note  Patient: Diana Sullivan  Procedure(s) Performed: COLONOSCOPY  Patient location during evaluation: Phase II Anesthesia Type: General Level of consciousness: awake and alert, awake and oriented Pain management: pain level controlled Vital Signs Assessment: post-procedure vital signs reviewed and stable Respiratory status: spontaneous breathing, nonlabored ventilation and respiratory function stable Cardiovascular status: blood pressure returned to baseline and stable Postop Assessment: no apparent nausea or vomiting Anesthetic complications: no   No notable events documented.   Last Vitals:  Vitals:   06/05/21 1109 06/05/21 1119  BP: (!) 109/59 131/80  Pulse: 79   Resp: 16 17  Temp:    SpO2: 95%     Last Pain:  Vitals:   06/05/21 0944  TempSrc: Temporal  PainSc: 2                  Phill Mutter

## 2021-06-05 NOTE — Op Note (Signed)
[facility] Gastroenterology Patient Name: Diana Sullivan Procedure Date: 06/05/2021 10:18 AM MRN: [id number] Account #: [id number] Date of Birth: 11-10-1935 Admit Type: Outpatient Age: 85 Room: [facility] ENDO ROOM 2 Gender: Female Note Status: Finalized Instrument Name: Colonoscope [id number] Procedure:             Colonoscopy Indications:           Positive Cologuard test Providers:             Benay Pike. Alice Reichert MD, MD Referring MD:          Venetia Maxon. Elijio Miles, MD (Referring MD) Medicines:             Propofol per Anesthesia Complications:         No immediate complications. Procedure:             Pre-Anesthesia Assessment:                        - The risks and benefits of the procedure and the                         sedation options and risks were discussed with the                         patient. All questions were answered and informed                         consent was obtained.                        - Patient identification and proposed procedure were                         verified prior to the procedure by the nurse. The                         procedure was verified in the procedure room.                        - ASA Grade Assessment: III - A patient with severe                         systemic disease.                        - After reviewing the risks and benefits, the patient                         was deemed in satisfactory condition to undergo the                         procedure.                        After obtaining informed consent, the colonoscope was                         passed under direct vision. Throughout the procedure,                         the patient's  blood pressure, pulse, and oxygen                         saturations were monitored continuously. The                         Colonoscope was introduced through the anus and                         advanced to the the cecum, identified by appendiceal                          orifice and ileocecal valve. The colonoscopy was                         somewhat difficult due to a tortuous colon. Successful                         completion of the procedure was aided by straightening                         and shortening the scope to obtain bowel loop                         reduction. The patient tolerated the procedure well.                         The quality of the bowel preparation was good. The                         ileocecal valve, appendiceal orifice, and rectum were                         photographed. Findings:      The perianal and digital rectal examinations were normal. Pertinent       negatives include normal sphincter tone and no palpable rectal lesions.      Non-bleeding internal hemorrhoids were found during retroflexion. The       hemorrhoids were Grade I (internal hemorrhoids that do not prolapse).      A 5 mm polyp was found in the proximal sigmoid colon. The polyp was       sessile. The polyp was removed with a jumbo cold forceps. Resection and       retrieval were complete.      No other significant abnormalities were identified in a careful       examination of the remainder of the colon. Impression:            - Non-bleeding internal hemorrhoids.                        - One 5 mm polyp in the proximal sigmoid colon,                         removed with a jumbo cold forceps. Resected and                         retrieved. Recommendation:        - Patient has a contact number available for  emergencies. The signs and symptoms of potential                         delayed complications were discussed with the patient.                         Return to normal activities tomorrow. Written                         discharge instructions were provided to the patient.                        - Resume previous diet.                        - Continue present medications.                        - If polyps are benign or  adenomatous without                         dysplasia, I will advise NO surveillance colonoscopy                         due to advanced age and/or severe comorbidity.                        - You do NOT require further colon cancer screening                         measures (Annual stool testing (i.e. hemoccult, FIT,                         cologuard), sigmoidoscopy, colonoscopy or CT                         colonography). You should share this recommendation                         with your Primary Care provider. Procedure Code(s):     --- Professional ---                        [phone], Colonoscopy, flexible; with biopsy, single or                         multiple Diagnosis Code(s):     --- Professional ---                        R19.5, Other fecal abnormalities                        K64.0, First degree hemorrhoids                        K63.5, Polyp of colon CPT copyright 2019 American Medical Association. All rights reserved. The codes documented in this report are preliminary and upon coder review may  be revised to meet current compliance requirements. Efrain Sella MD, MD 06/05/2021 10:49:40 AM This report has been signed electronically. Number of Addenda: 0  Note Initiated On: 06/05/2021 10:18 AM Scope Withdrawal Time: 0 hours 5 minutes 36 seconds  Total Procedure Duration: 0 hours 13 minutes 31 seconds  Estimated Blood Loss:  Estimated blood loss: none.      [facility]

## 2021-06-05 NOTE — Anesthesia Preprocedure Evaluation (Signed)
Anesthesia Evaluation  Patient identified by MRN, date of birth, ID band Patient awake    Reviewed: Allergy & Precautions, NPO status , Patient's Chart, lab work & pertinent test results, reviewed documented beta blocker date and time   History of Anesthesia Complications (+) history of anesthetic complications  Airway Mallampati: II  TM Distance: >3 FB Neck ROM: Full    Dental  (+) Partial Upper, Partial Lower   Pulmonary sleep apnea , former smoker,    Pulmonary exam normal        Cardiovascular hypertension, Pt. on medications and Pt. on home beta blockers + angina with exertion Normal cardiovascular exam+ Valvular Problems/Murmurs MVP      Neuro/Psych  Headaches,  Neuromuscular disease negative psych ROS   GI/Hepatic Neg liver ROS, Bowel prep,GERD  Medicated,  Endo/Other  negative endocrine ROS  Renal/GU negative Renal ROS  negative genitourinary   Musculoskeletal  (+) Arthritis , Osteoarthritis,    Abdominal   Peds negative pediatric ROS (+)  Hematology negative hematology ROS (+)   Anesthesia Other Findings Anginal pain ([facility])  chest pain occasionally, checked out [facility] (no Issues)  Arthritis  hands  Bronchitis    Complication of anesthesia  patient stated"They had trouble waking me up"  Elevated lipids    Gout    Headache  once every 2 or 3 weeks  History of recent fall  shoulder and knee pain, left side  Hypertension  essential hypertension-controlled on meds  Mitral valve disorder    Muscular dystrophy ([place])  Patient stated someone told her 25 years ago "that she had the gene for it" No issues  MVP (mitral valve prolapse)    Neuropathy    Pre-diabetes  watching diet  Wears partial dentures  upper and lower  Cardiac MRI 2021: 1. The left ventricle is normal in cavity size and wall thickness. Global LV systolic function is normal  with an LV ejection fraction calculated at 60%.  There are no regional wall motion abnormalities.   2. Theright ventricle is normal in cavity size, wall thickness, and systolic function. There are no RV  wall motion abnormalities or aneurysms.   3. Both atria are normal in size.   4. The aortic valve is trileaflet in morphology. There is trivial aortic regurgitation without stenosis.   5. There is mild mitral valve regurgitation. There is trivial tricuspid valve regurgitation.   6. Delayed enhancement imaging demonstrates no evidence of myocardial infarction, scaring or infiltrative  disease.   7. No LV thrombus visualized.       Reproductive/Obstetrics negative OB ROS                            Anesthesia Physical Anesthesia Plan  ASA: 3  Anesthesia Plan:    Post-op Pain Management:    Induction: Intravenous  PONV Risk Score and Plan: 2 and Propofol infusion and TIVA  Airway Management Planned: Natural Airway and Nasal Cannula  Additional Equipment:   Intra-op Plan:   Post-operative Plan:   Informed Consent: I have reviewed the patients History and Physical, chart, labs and discussed the procedure including the risks, benefits and alternatives for the proposed anesthesia with the patient or authorized representative who has indicated his/her understanding and acceptance.       Plan Discussed with: CRNA, Anesthesiologist and Surgeon  Anesthesia Plan Comments:         Anesthesia Quick Evaluation

## 2021-06-06 ENCOUNTER — Encounter: Payer: Self-pay | Admitting: Internal Medicine

## 2021-06-06 LAB — SURGICAL PATHOLOGY

## 2021-06-18 DIAGNOSIS — E785 Hyperlipidemia, unspecified: Secondary | ICD-10-CM | POA: Diagnosis not present

## 2021-06-18 DIAGNOSIS — E749 Disorder of carbohydrate metabolism, unspecified: Secondary | ICD-10-CM | POA: Diagnosis not present

## 2021-06-19 DIAGNOSIS — I10 Essential (primary) hypertension: Secondary | ICD-10-CM | POA: Diagnosis not present

## 2021-06-19 DIAGNOSIS — M5412 Radiculopathy, cervical region: Secondary | ICD-10-CM | POA: Diagnosis not present

## 2021-06-19 DIAGNOSIS — N3941 Urge incontinence: Secondary | ICD-10-CM | POA: Diagnosis not present

## 2021-06-19 DIAGNOSIS — E749 Disorder of carbohydrate metabolism, unspecified: Secondary | ICD-10-CM | POA: Diagnosis not present

## 2021-06-19 DIAGNOSIS — K219 Gastro-esophageal reflux disease without esophagitis: Secondary | ICD-10-CM | POA: Diagnosis not present

## 2021-06-19 DIAGNOSIS — E785 Hyperlipidemia, unspecified: Secondary | ICD-10-CM | POA: Diagnosis not present

## 2021-06-19 DIAGNOSIS — M5430 Sciatica, unspecified side: Secondary | ICD-10-CM | POA: Diagnosis not present

## 2021-06-19 DIAGNOSIS — J301 Allergic rhinitis due to pollen: Secondary | ICD-10-CM | POA: Diagnosis not present

## 2021-06-19 DIAGNOSIS — F411 Generalized anxiety disorder: Secondary | ICD-10-CM | POA: Diagnosis not present

## 2021-06-28 DIAGNOSIS — E119 Type 2 diabetes mellitus without complications: Secondary | ICD-10-CM | POA: Diagnosis not present

## 2021-06-28 DIAGNOSIS — H16143 Punctate keratitis, bilateral: Secondary | ICD-10-CM | POA: Diagnosis not present

## 2021-07-01 DIAGNOSIS — E785 Hyperlipidemia, unspecified: Secondary | ICD-10-CM | POA: Diagnosis not present

## 2021-07-01 DIAGNOSIS — I10 Essential (primary) hypertension: Secondary | ICD-10-CM | POA: Diagnosis not present

## 2021-07-01 DIAGNOSIS — Z789 Other specified health status: Secondary | ICD-10-CM | POA: Diagnosis not present

## 2021-07-01 DIAGNOSIS — I341 Nonrheumatic mitral (valve) prolapse: Secondary | ICD-10-CM | POA: Diagnosis not present

## 2021-07-01 DIAGNOSIS — Z23 Encounter for immunization: Secondary | ICD-10-CM | POA: Diagnosis not present

## 2021-07-01 DIAGNOSIS — R0683 Snoring: Secondary | ICD-10-CM | POA: Diagnosis not present

## 2021-07-01 DIAGNOSIS — I340 Nonrheumatic mitral (valve) insufficiency: Secondary | ICD-10-CM | POA: Diagnosis not present

## 2021-07-01 DIAGNOSIS — Z9889 Other specified postprocedural states: Secondary | ICD-10-CM | POA: Diagnosis not present

## 2021-07-12 DIAGNOSIS — H524 Presbyopia: Secondary | ICD-10-CM | POA: Diagnosis not present

## 2021-07-12 DIAGNOSIS — H04123 Dry eye syndrome of bilateral lacrimal glands: Secondary | ICD-10-CM | POA: Diagnosis not present

## 2021-07-15 DIAGNOSIS — K219 Gastro-esophageal reflux disease without esophagitis: Secondary | ICD-10-CM | POA: Diagnosis not present

## 2021-07-15 DIAGNOSIS — E559 Vitamin D deficiency, unspecified: Secondary | ICD-10-CM | POA: Diagnosis not present

## 2021-07-15 DIAGNOSIS — F411 Generalized anxiety disorder: Secondary | ICD-10-CM | POA: Diagnosis not present

## 2021-07-15 DIAGNOSIS — N3941 Urge incontinence: Secondary | ICD-10-CM | POA: Diagnosis not present

## 2021-07-15 DIAGNOSIS — M5430 Sciatica, unspecified side: Secondary | ICD-10-CM | POA: Diagnosis not present

## 2021-07-15 DIAGNOSIS — E785 Hyperlipidemia, unspecified: Secondary | ICD-10-CM | POA: Diagnosis not present

## 2021-07-15 DIAGNOSIS — J301 Allergic rhinitis due to pollen: Secondary | ICD-10-CM | POA: Diagnosis not present

## 2021-07-15 DIAGNOSIS — I10 Essential (primary) hypertension: Secondary | ICD-10-CM | POA: Diagnosis not present

## 2021-07-15 DIAGNOSIS — M5412 Radiculopathy, cervical region: Secondary | ICD-10-CM | POA: Diagnosis not present

## 2021-07-29 DIAGNOSIS — Z789 Other specified health status: Secondary | ICD-10-CM | POA: Diagnosis not present

## 2021-07-29 DIAGNOSIS — I340 Nonrheumatic mitral (valve) insufficiency: Secondary | ICD-10-CM | POA: Diagnosis not present

## 2021-07-29 DIAGNOSIS — Z23 Encounter for immunization: Secondary | ICD-10-CM | POA: Diagnosis not present

## 2021-07-29 DIAGNOSIS — Z01810 Encounter for preprocedural cardiovascular examination: Secondary | ICD-10-CM | POA: Diagnosis not present

## 2021-07-29 DIAGNOSIS — E785 Hyperlipidemia, unspecified: Secondary | ICD-10-CM | POA: Diagnosis not present

## 2021-07-29 DIAGNOSIS — I10 Essential (primary) hypertension: Secondary | ICD-10-CM | POA: Diagnosis not present

## 2021-07-29 DIAGNOSIS — Z9889 Other specified postprocedural states: Secondary | ICD-10-CM | POA: Diagnosis not present

## 2021-07-29 DIAGNOSIS — I341 Nonrheumatic mitral (valve) prolapse: Secondary | ICD-10-CM | POA: Diagnosis not present

## 2021-07-29 DIAGNOSIS — R079 Chest pain, unspecified: Secondary | ICD-10-CM | POA: Diagnosis not present

## 2021-07-31 DIAGNOSIS — G8929 Other chronic pain: Secondary | ICD-10-CM | POA: Diagnosis not present

## 2021-07-31 DIAGNOSIS — M25512 Pain in left shoulder: Secondary | ICD-10-CM | POA: Diagnosis not present

## 2021-07-31 DIAGNOSIS — M19012 Primary osteoarthritis, left shoulder: Secondary | ICD-10-CM | POA: Diagnosis not present

## 2021-08-29 ENCOUNTER — Ambulatory Visit: Payer: Medicare PPO | Admitting: Neurology

## 2021-08-29 ENCOUNTER — Other Ambulatory Visit: Payer: Self-pay

## 2021-08-29 ENCOUNTER — Encounter: Payer: Self-pay | Admitting: Neurology

## 2021-08-29 VITALS — BP 162/94 | HR 68 | Ht 67.0 in | Wt 168.0 lb

## 2021-08-29 DIAGNOSIS — R200 Anesthesia of skin: Secondary | ICD-10-CM | POA: Diagnosis not present

## 2021-08-29 DIAGNOSIS — R519 Headache, unspecified: Secondary | ICD-10-CM

## 2021-08-29 DIAGNOSIS — R29898 Other symptoms and signs involving the musculoskeletal system: Secondary | ICD-10-CM | POA: Diagnosis not present

## 2021-08-29 DIAGNOSIS — R208 Other disturbances of skin sensation: Secondary | ICD-10-CM | POA: Diagnosis not present

## 2021-08-29 DIAGNOSIS — R29818 Other symptoms and signs involving the nervous system: Secondary | ICD-10-CM

## 2021-08-29 DIAGNOSIS — M5481 Occipital neuralgia: Secondary | ICD-10-CM

## 2021-08-29 DIAGNOSIS — R202 Paresthesia of skin: Secondary | ICD-10-CM

## 2021-08-29 DIAGNOSIS — M069 Rheumatoid arthritis, unspecified: Secondary | ICD-10-CM | POA: Diagnosis not present

## 2021-08-29 DIAGNOSIS — G5600 Carpal tunnel syndrome, unspecified upper limb: Secondary | ICD-10-CM | POA: Diagnosis not present

## 2021-08-29 NOTE — Progress Notes (Signed)
?[facility] ? ? ? ?Provider:  Dr Jaynee Eagles ?Requesting Provider: Jodi Marble, MD ?Primary Care Provider:  Jodi Marble, MD ? ?CC:  shooting pains in the back of the head, down the arm also foot numbness ? ?HPI:  Diana Sullivan is a 86 y.o. female here as requested by Jodi Marble, MD for numbness and tingling in the right arm and hand. But she says she is here for shooting pains starting he back of the head and shooting up and to the eyeball. Starts in the back of the head(points to the emergence of the greater occipital nerve). Not a headache person. No history of migraines. No light or sounds sensitivity, not pulsating, not migrainous. They move up the back of the head. She is feel pain in the left side of her head, not a constant thing, it can start and last for a few seconds. 5-10 minutes. She can just be sitting and it all of a sudden starts. The pain can be severe. Lucianne Lei be a few times a week or a few times a day. Ongoing for a year. Stable. She has severe chronic neck pain, nothing helps, unknown triggers. She has pain in her right hand, she cannot pick up anything. She has numbness of the right finger. Her forearm goes numb on and off, the right finger is numb. She wakes up in the middle of the night with numbness, shake out hands. In both feet, they start to burn, then sore. In balls of the feet. Son is here and provides much information. No other focal neurologic deficits, associated symptoms, inciting events or modifiable factors. ? ? ?Reviewed notes, labs and imaging from outside physicians, which showed: ? ? ?CT head 03/18/2019: IMPRESSION: ?Small hyperdense area within the right basal ganglia is stable since ?prior study. Cannot exclude small hemorrhage. ? ?MRI cervical spine 03/18/2019: IMPRESSION: ?1. No acute or convincing posttraumatic finding in the cervical ?spine when comparing to the September MRI. ?2. Widespread advanced cervical spine degeneration with  multilevel ?spinal stenosis, multilevel mild spinal cord mass effect, and up to ?severe foraminal stenosis. No associated spinal cord signal ?abnormality. ? ?Review of Systems: ?Patient complains of symptoms per HPI as well as the following symptoms numbness and tingling. Pertinent negatives and positives per HPI. All others negative. ? ? ?Social History  ? ?Socioeconomic History  ? Marital status: Divorced  ?  Spouse name: Not on file  ? Number of children: Not on file  ? Years of education: Not on file  ? Highest education level: Not on file  ?Occupational History  ? Not on file  ?Tobacco Use  ? Smoking status: Former  ?  Packs/day: 3.00  ?  Years: 27.00  ?  Pack years: 81.00  ?  Types: Cigarettes  ?  Quit date: 04/19/1994  ?  Years since quitting: 27.3  ? Smokeless tobacco: Never  ? Tobacco comments:  ?  QUIT 25 YEARS AGO  ?Vaping Use  ? Vaping Use: Never used  ?Substance and Sexual Activity  ? Alcohol use: Yes  ?  Comment: SOCIAL  ? Drug use: Never  ? Sexual activity: Not on file  ?Other Topics Concern  ? Not on file  ?Social History Narrative  ? Not on file  ? ?Social Determinants of Health  ? ?Financial Resource Strain: Not on file  ?Food Insecurity: Not on file  ?Transportation Needs: Not on file  ?Physical Activity: Not on file  ?Stress: Not on file  ?Social  Connections: Not on file  ?Intimate Partner Violence: Not on file  ? ? ?Family History  ?Problem Relation Age of Onset  ? Breast cancer Neg Hx   ? Neuropathy Neg Hx   ? ? ?Past Medical History:  ?Diagnosis Date  ? Anginal pain ([place])   ? chest pain occasionally, checked out Dr [facility] (no Issues)  ? Arthritis   ? hands  ? Bronchitis   ? Complication of anesthesia   ? patient stated"They had trouble waking me up"  ? Elevated lipids   ? Gout   ? Headache   ? once every 2 or 3 weeks  ? History of recent fall   ? shoulder and knee pain, left side  ? Hypertension   ? essential hypertension-controlled on meds  ? Mitral valve disorder   ?  Muscular dystrophy ([place])   ? Patient stated someone told her 25 years ago "that she had the gene for it" No issues  ? MVP (mitral valve prolapse)   ? Neuropathy   ? Pre-diabetes   ? watching diet  ? Wears partial dentures   ? upper and lower  ? ? ?Patient Active Problem List  ? Diagnosis Date Noted  ? Ganglion cyst 10/19/2019  ? Cervical radiculitis 09/30/2019  ? Neck pain 09/30/2019  ? Primary osteoarthritis of both hands 09/21/2019  ? Swelling of right ring finger 09/21/2019  ? Nondisplaced fracture of proximal phalanx of left lesser toe(s), initial encounter for open fracture 01/03/2019  ? Obstructive sleep apnea syndrome 12/14/2017  ? Generalized weakness 07/28/2017  ? Snoring 07/28/2017  ? Chronic right hip pain 10/01/2016  ? Generalized osteoarthritis of hand 10/01/2016  ? Trigger ring finger of left hand 10/01/2016  ? Localized, primary osteoarthritis of the ankle and foot, left 05/15/2014  ? Chest pain on exertion 01/30/2014  ? Primary localized osteoarthrosis of ankle and foot 11/16/2013  ? Statin intolerance 09/03/2013  ? Hyperlipidemia, unspecified 09/03/2013  ? Cataract 12/09/2010  ? Other abnormal glucose 12/09/2010  ? Mitral valve disorders(424.0) 12/09/2010  ? 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 Benign essential hypertension 12/09/2010  ? Mitral valve prolapse 12/09/2010  ? Nonrheumatic mitral valve regurgitation 12/09/2010  ? Essential hypertension 02/10/2008  ? S/P cardiac catheterization 06/18/2006  ? ? ?Past Surgical History:  ?Procedure Laterality Date  ? ABDOMINAL HYSTERECTOMY    ? adenectomy    ? BACK SURGERY    ? BREAST SURGERY    ? biopsy, lumpectomy  ? BUNIONECTOMY Bilateral   ? CARDIAC CATHETERIZATION    ? no issue  ? CATARACT EXTRACTION Left 12/06/2012  ? CATARACT EXTRACTION W/PHACO Right 11/14/2019  ? Procedure: CATARACT EXTRACTION PHACO AND INTRAOCULAR LENS PLACEMENT (IOC) RIGHT 4.76  00:44.4;  Surgeon: Eulogio Bear, MD;  Location: [facility];   Service: Ophthalmology;  Laterality: Right;  ? COLONOSCOPY N/A 04/21/2016  ? Procedure: COLONOSCOPY;  Surgeon: Manya Silvas, MD;  Location[facility] ENDOSCOPY;  Service: Endoscopy;  Laterality: N/A;  Diabetic  ? COLONOSCOPY N/A 06/05/2021  ? Procedure: COLONOSCOPY;  Surgeon: Toledo, Benay Pike, MD;  Location: [facility] ENDOSCOPY;  Service: Gastroenterology;  Laterality: N/A;  ? EYE SURGERY    ? metatarsal osteotomy Left 03-18-2013  ? TONSILLECTOMY    ? TYMPANOSTOMY TUBE PLACEMENT    ? ? ?Current Outpatient Medications  ?Medication Sig Dispense Refill  ? Acetaminophen 500 MG capsule Take by mouth as needed.    ? Blood Glucose Monitoring Suppl (ONE TOUCH ULTRA 2) w/Device KIT     ?  diphenhydrAMINE (BENADRYL) 25 mg capsule Take 25 mg by mouth every 6 (six) hours as needed for itching.    ? EPINEPHrine 0.3 mg/0.3 mL IJ SOAJ injection Reported on 05/30/2015    ? fexofenadine (ALLEGRA) 180 MG tablet Take 180 mg by mouth daily.    ? hydrOXYzine (ATARAX) 10 MG tablet Take 10 mg by mouth 2 (two) times daily.    ? olmesartan-hydrochlorothiazide (BENICAR HCT) 20-12.5 MG tablet Take 1 tablet by mouth daily.     ? ONE TOUCH ULTRA TEST test strip     ? ?No current facility-administered medications for this visit.  ? ? ?Allergies as of 08/29/2021 - Review Complete 08/29/2021  ?Allergen Reaction Noted  ? Azithromycin Rash   ? Iodinated contrast media    ? Shellfish allergy Itching and Swelling 11/02/2019  ? ? ?Vitals: ?BP (!) 162/94   Pulse 68   Ht _0  (1.702 m)   Wt 168 lb (76.2 kg)   BMI 26.31 kg/m?  ?Last Weight:  ?Wt Readings from Last 1 Encounters:  ?08/29/21 168 lb (76.2 kg)  ? ?Last Height:   ?Ht Readings from Last 1 Encounters:  ?08/29/21 _1  (1.702 m)  ? ? ? ?Physical exam: ?Exam: ?Gen: NAD, conversant, well nourised, well groomed                     ?CV: RRR, no MRG. No Carotid Bruits. No peripheral edema, warm, nontender ?Eyes: Conjunctivae clear without exudates or hemorrhage ? ?Neuro: ?Detailed Neurologic  Exam ? ?Speech: ?   Speech is normal; fluent and spontaneous with normal comprehension.  ?Cognition: ?   The patient is oriented to person, place, and time;  ?   recent and remote memory intact;  ?   language fluent;  ?   normal atte

## 2021-08-29 NOTE — Patient Instructions (Addendum)
MRI brain and cervical spine ?Bloodwork today to rule out Temporal Arteritis and other cause of neuropathy ?Then send for injections into the neck. Dr. Davy Pique at [facility] ?EMG/NCS - Carpal Tunnel Syndrome in the right hand and possibly the left - [facility]  ? ?-May consider daily alpha lipoic acid which is an antioxidant that may reduce free radical oxidative stress associated with diabetic polyneuropathy, existing evidence suggests that alpha lipoic acid significantly reduces stabbing, lancinating and burning pain and diabetic neuropathy with its onset of action as early as 1-2 weeks. 400-'600mg'$  a day. (Google alpha lipoic acid diabetic neuropathy) ? ? ?Peripheral Neuropathy ?Peripheral neuropathy is a type of nerve damage. It affects nerves that carry signals between the spinal cord and the arms, legs, and the rest of the body (peripheral nerves). It does not affect nerves in the spinal cord or brain. In peripheral neuropathy, one nerve or a group of nerves may be damaged. Peripheral neuropathy is a broad category that includes many specific nerve disorders, like diabetic neuropathy, hereditary neuropathy, and carpal tunnel syndrome. ?What are the causes? ?This condition may be caused by: ?Diabetes. This is the most common cause of peripheral neuropathy. ?Nerve injury. ?Pressure or stress on a nerve that lasts a long time. ?Lack (deficiency) of B vitamins. This can result from alcoholism, poor diet, or a restricted diet. ?Infections. ?Autoimmune diseases, such as rheumatoid arthritis and systemic lupus erythematosus. ?Nerve diseases that are passed from parent to child (inherited). ?Some medicines, such as cancer medicines (chemotherapy). ?Poisonous (toxic) substances, such as lead and mercury. ?Too little blood flowing to the legs. ?Kidney disease. ?Thyroid disease. ?In some cases, the cause of this condition is not known. ?What are the signs or symptoms? ?Symptoms of this condition  depend on which of your nerves is damaged. Common symptoms include: ?Loss of feeling (numbness) in the feet, hands, or both. ?Tingling in the feet, hands, or both. ?Burning pain. ?Very sensitive skin. ?Weakness. ?Not being able to move a part of the body (paralysis). ?Muscle twitching. ?Clumsiness or poor coordination. ?Loss of balance. ?Not being able to control your bladder. ?Feeling dizzy. ?Sexual problems. ?How is this diagnosed? ?Diagnosing and finding the cause of peripheral neuropathy can be difficult. Your health care provider will take your medical history and do a physical exam. A neurological exam will also be done. This involves checking things that are affected by your brain, spinal cord, and nerves (nervous system). For example, your health care provider will check your reflexes, how you move, and what you can feel. ?You may have other tests, such as: ?Blood tests. ?Electromyogram (EMG) and nerve conduction tests. These tests check nerve function and how well the nerves are controlling the muscles. ?Imaging tests, such as CT scans or MRI to rule out other causes of your symptoms. ?Removing a small piece of nerve to be examined in a lab (nerve biopsy). ?Removing and examining a small amount of the fluid that surrounds the brain and spinal cord (lumbar puncture). ?How is this treated? ?Treatment for this condition may involve: ?Treating the underlying cause of the neuropathy, such as diabetes, kidney disease, or vitamin deficiencies. ?Stopping medicines that can cause neuropathy, such as chemotherapy. ?Medicine to help relieve pain. Medicines may include: ?Prescription or over-the-counter pain medicine. ?Antiseizure medicine. ?Antidepressants. ?Pain-relieving patches that are applied to painful areas of skin. ?Surgery to relieve pressure on a nerve or to destroy a nerve that is causing pain. ?Physical therapy to help improve movement and balance. ?Devices to  help you move around (assistive  devices). ?Follow these instructions at home: ?Medicines ?Take over-the-counter and prescription medicines only as told by your health care provider. Do not take any other medicines without first asking your health care provider. ?Do not drive or use heavy machinery while taking prescription pain medicine. ?Lifestyle ? ?Do not use any products that contain nicotine or tobacco, such as cigarettes and e-cigarettes. Smoking keeps blood from reaching damaged nerves. If you need help quitting, ask your health care provider. ?Avoid or limit alcohol. Too much alcohol can cause a vitamin B deficiency, and vitamin B is needed for healthy nerves. ?Eat a healthy diet. This includes: ?Eating foods that are high in fiber, such as fresh fruits and vegetables, whole grains, and beans. ?Limiting foods that are high in fat and processed sugars, such as fried or sweet foods. ?General instructions ? ?If you have diabetes, work closely with your health care provider to keep your blood sugar under control. ?If you have numbness in your feet: ?Check every day for signs of injury or infection. Watch for redness, warmth, and swelling. ?Wear padded socks and comfortable shoes. These help protect your feet. ?Develop a good support system. Living with peripheral neuropathy can be stressful. Consider talking with a mental health specialist or joining a support group. ?Use assistive devices and attend physical therapy as told by your health care provider. This may include using a walker or a cane. ?Keep all follow-up visits as told by your health care provider. This is important. ?Contact a health care provider if: ?You have new signs or symptoms of peripheral neuropathy. ?You are struggling emotionally from dealing with peripheral neuropathy. ?Your pain is not well-controlled. ?Get help right away if: ?You have an injury or infection that is not healing normally. ?You develop new weakness in an arm or leg. ?You have fallen or do so  frequently. ?Summary ?Peripheral neuropathy is when the nerves in the arms, or legs are damaged, resulting in numbness, weakness, or pain. ?There are many causes of peripheral neuropathy, including diabetes, pinched nerves, vitamin deficiencies, autoimmune disease, and hereditary conditions. ?Diagnosing and finding the cause of peripheral neuropathy can be difficult. Your health care provider will take your medical history, do a physical exam, and do tests, including blood tests and nerve function tests. ?Treatment involves treating the underlying cause of the neuropathy and taking medicines to help control pain. Physical therapy and assistive devices may also help. ?This information is not intended to replace advice given to you by your health care provider. Make sure you discuss any questions you have with your health care provider. ?Document Revised: 03/06/2020 Document Reviewed: 03/06/2020 ?Elsevier Patient Education ? 2022 [place]. ? ? ?Occipital Neuralgia ?Occipital neuralgia is a type of headache that causes brief episodes of very bad pain in the back of the head. Pain from occipital neuralgia may spread (radiate) to other parts of the head. ?These headaches may be caused by irritation of the nerves that leave the spinal cord high up in the neck, just below the base of the skull (occipital nerves). The occipital nerves transmit sensations from the back of the head, the top of the head, and the areas behind the ears. ?What are the causes? ?This condition can occur without any known cause (primary headache syndrome). In other cases, this condition is caused by pressure on or irritation of one of the two occipital nerves. Pressure and irritation may be due to: ?Muscle spasm in the neck. ?Neck injury. ?Wear and tear  of the vertebrae in the neck (osteoarthritis). ?Disease of the disks that separate the vertebrae. ?Swollen blood vessels that put pressure on the occipital  nerves. ?Infections. ?Tumors. ?Diabetes. ?What are the signs or symptoms? ?This condition causes brief burning, stabbing, electric, shocking, or shooting pain in the back of the head that can radiate to the top of the head. It can happen on one side or both sides of t

## 2021-09-02 ENCOUNTER — Encounter: Payer: Self-pay | Admitting: Neurology

## 2021-09-03 LAB — ANA COMPREHENSIVE PANEL
Anti JO-1: <0.2 AI (ref 0.0–0.9)
Centromere Ab Screen: <0.2 AI (ref 0.0–0.9)
Chromatin Ab SerPl-aCnc: 0.4 AI (ref 0.0–0.9)
ENA RNP Ab: 0.2 AI (ref 0.0–0.9)
ENA SM Ab Ser-aCnc: <0.2 AI (ref 0.0–0.9)
ENA SSA (RO) Ab: <0.2 AI (ref 0.0–0.9)
ENA SSB (LA) Ab: <0.2 AI (ref 0.0–0.9)
Scleroderma (Scl-70) (ENA) Antibody, IgG: <0.2 AI (ref 0.0–0.9)
dsDNA Ab: <1 IU/mL (ref 0–9)

## 2021-09-03 LAB — BASIC METABOLIC PANEL
BUN/Creatinine Ratio: 19 (ref 12–28)
BUN: 17 mg/dL (ref 8–27)
CO2: 24 mmol/L (ref 20–29)
Calcium: 9.8 mg/dL (ref 8.7–10.3)
Chloride: 103 mmol/L (ref 96–106)
Creatinine, Ser: 0.89 mg/dL (ref 0.57–1.00)
Glucose: 105 mg/dL — ABNORMAL HIGH (ref 70–99)
Potassium: 4.4 mmol/L (ref 3.5–5.2)
Sodium: 140 mmol/L (ref 134–144)
eGFR: 64 mL/min/{1.73_m2} (ref >59)

## 2021-09-03 LAB — MULTIPLE MYELOMA PANEL, SERUM
Albumin SerPl Elph-Mcnc: 4 g/dL (ref 2.9–4.4)
Albumin/Glob SerPl: 1.3 (ref 0.7–1.7)
Alpha 1: 0.3 g/dL (ref 0.0–0.4)
Alpha2 Glob SerPl Elph-Mcnc: 0.7 g/dL (ref 0.4–1.0)
B-Globulin SerPl Elph-Mcnc: 1.1 g/dL (ref 0.7–1.3)
Gamma Glob SerPl Elph-Mcnc: 1.2 g/dL (ref 0.4–1.8)
Globulin, Total: 3.3 g/dL (ref 2.2–3.9)
IgA/Immunoglobulin A, Serum: 156 mg/dL (ref 64–422)
IgG (Immunoglobin G), Serum: 1258 mg/dL (ref 586–1602)
IgM (Immunoglobulin M), Srm: 46 mg/dL (ref 26–217)
Total Protein: 7.3 g/dL (ref 6.0–8.5)

## 2021-09-03 LAB — B12 AND FOLATE PANEL
Folate: 12.3 ng/mL (ref >3.0)
Vitamin B-12: 399 pg/mL (ref 232–1245)

## 2021-09-03 LAB — C-REACTIVE PROTEIN: CRP: 4 mg/L (ref 0–10)

## 2021-09-03 LAB — METHYLMALONIC ACID, SERUM: Methylmalonic Acid: 166 nmol/L (ref 0–378)

## 2021-09-03 LAB — VITAMIN B6: Vitamin B6: 4.5 ug/L (ref 3.4–65.2)

## 2021-09-03 LAB — CYCLIC CITRUL PEPTIDE ANTIBODY, IGG/IGA: Cyclic Citrullin Peptide Ab: 1 units (ref 0–19)

## 2021-09-03 LAB — VITAMIN B1: Thiamine: 121.1 nmol/L (ref 66.5–200.0)

## 2021-09-03 LAB — SEDIMENTATION RATE: Sed Rate: 5 mm/hr (ref 0–40)

## 2021-09-03 LAB — RHEUMATOID FACTOR: Rheumatoid fact SerPl-aCnc: <10 IU/mL (ref <14.0)

## 2021-09-04 ENCOUNTER — Telehealth: Payer: Self-pay | Admitting: *Deleted

## 2021-09-04 ENCOUNTER — Encounter: Payer: Self-pay | Admitting: Neurology

## 2021-09-04 NOTE — Telephone Encounter (Signed)
error 

## 2021-09-04 NOTE — Telephone Encounter (Signed)
-----   Message from Melvenia Beam, MD sent at 09/04/2021  8:28 AM EDT ----- ?Blood work is normal thanks ?

## 2021-09-04 NOTE — Telephone Encounter (Signed)
Spoke to patient gave lab results Patient thanked me for calling  ?

## 2021-09-04 NOTE — Telephone Encounter (Signed)
[employer] pending uploaded notes on the portal  

## 2021-09-05 ENCOUNTER — Telehealth: Payer: Self-pay | Admitting: Neurology

## 2021-09-05 DIAGNOSIS — M1712 Unilateral primary osteoarthritis, left knee: Secondary | ICD-10-CM | POA: Diagnosis not present

## 2021-09-05 NOTE — Telephone Encounter (Signed)
Referral for NCV/EMG sent to [facility] [phone]. ?

## 2021-09-05 NOTE — Telephone Encounter (Signed)
Mcarthur Rossetti Josem Kaufmann: [id number] (exp. 09/04/21 to 10/04/21) order sent to GI, they will reach out to the patient to schedule.  ?

## 2021-09-17 ENCOUNTER — Ambulatory Visit
Admission: RE | Admit: 2021-09-17 | Discharge: 2021-09-17 | Disposition: A | Discharge status: Home / Self Care | Payer: Medicare PPO | Source: Ambulatory Visit | Attending: Neurology | Admitting: Neurology

## 2021-09-17 ENCOUNTER — Other Ambulatory Visit: Payer: Medicare PPO

## 2021-09-17 DIAGNOSIS — R208 Other disturbances of skin sensation: Secondary | ICD-10-CM

## 2021-09-17 DIAGNOSIS — R200 Anesthesia of skin: Secondary | ICD-10-CM | POA: Diagnosis not present

## 2021-09-17 DIAGNOSIS — R29818 Other symptoms and signs involving the nervous system: Secondary | ICD-10-CM

## 2021-09-17 DIAGNOSIS — R29898 Other symptoms and signs involving the musculoskeletal system: Secondary | ICD-10-CM

## 2021-09-17 DIAGNOSIS — R202 Paresthesia of skin: Secondary | ICD-10-CM

## 2021-09-17 DIAGNOSIS — R519 Headache, unspecified: Secondary | ICD-10-CM | POA: Diagnosis not present

## 2021-09-17 DIAGNOSIS — M5481 Occipital neuralgia: Secondary | ICD-10-CM

## 2021-09-17 MED ORDER — GADOBENATE DIMEGLUMINE 529 MG/ML IV SOLN
15.0000 mL | Freq: Once | INTRAVENOUS | Status: AC | PRN
  Administered 2021-09-17: 15 mL via INTRAVENOUS

## 2021-09-18 DIAGNOSIS — Z87891 Personal history of nicotine dependence: Secondary | ICD-10-CM | POA: Diagnosis not present

## 2021-09-18 DIAGNOSIS — Z8249 Family history of ischemic heart disease and other diseases of the circulatory system: Secondary | ICD-10-CM | POA: Diagnosis not present

## 2021-09-18 DIAGNOSIS — Z833 Family history of diabetes mellitus: Secondary | ICD-10-CM | POA: Diagnosis not present

## 2021-09-18 DIAGNOSIS — I10 Essential (primary) hypertension: Secondary | ICD-10-CM | POA: Diagnosis not present

## 2021-09-23 DIAGNOSIS — Z961 Presence of intraocular lens: Secondary | ICD-10-CM | POA: Diagnosis not present

## 2021-09-23 DIAGNOSIS — E119 Type 2 diabetes mellitus without complications: Secondary | ICD-10-CM | POA: Diagnosis not present

## 2021-09-23 DIAGNOSIS — H1013 Acute atopic conjunctivitis, bilateral: Secondary | ICD-10-CM | POA: Diagnosis not present

## 2021-09-25 DIAGNOSIS — M19042 Primary osteoarthritis, left hand: Secondary | ICD-10-CM | POA: Diagnosis not present

## 2021-09-25 DIAGNOSIS — G5601 Carpal tunnel syndrome, right upper limb: Secondary | ICD-10-CM | POA: Diagnosis not present

## 2021-09-25 DIAGNOSIS — M79641 Pain in right hand: Secondary | ICD-10-CM | POA: Diagnosis not present

## 2021-09-25 DIAGNOSIS — M180 Bilateral primary osteoarthritis of first carpometacarpal joints: Secondary | ICD-10-CM | POA: Diagnosis not present

## 2021-09-25 DIAGNOSIS — M79642 Pain in left hand: Secondary | ICD-10-CM | POA: Diagnosis not present

## 2021-09-25 DIAGNOSIS — M19041 Primary osteoarthritis, right hand: Secondary | ICD-10-CM | POA: Diagnosis not present

## 2021-09-25 DIAGNOSIS — M67431 Ganglion, right wrist: Secondary | ICD-10-CM | POA: Diagnosis not present

## 2021-09-26 ENCOUNTER — Telehealth: Payer: Self-pay | Admitting: *Deleted

## 2021-09-26 NOTE — Telephone Encounter (Signed)
I called the patient and we discussed her MRI cervical spine results.  Patient verbalized under standing and her questions were answered.  She would like to speak with her son first before deciding if she would like to proceed with a neurosurgery consultation for evaluation of injections to help her pain.  We also discussed the results of her MRI brain as noted below by Dr. Lavell Anchors.  Patient aware she has moderate white matter changes and we did discuss that if severe they could calls memory loss.  We discussed the best thing she can do is to control her vascular risk factors.  We discussed examples of those and also encouraged her that her MRI did not show any strokes or anything in the brain to explain her symptoms.  Her symptoms are coming from her neck as Dr. Lavell Anchors suspected.  The patient will discuss with her son and will give [place] a call back.  She understands I did not see a DPR on file so she would need to be the one to call back until the form is complete.  The patient verbalized appreciation for the call. ?

## 2021-09-26 NOTE — Telephone Encounter (Signed)
-----   Message from Melvenia Beam, MD sent at 09/25/2021  7:36 AM EDT ----- ?Pod 4: Patient has a lot of cervical arthritis/degenerative disease and this is likely why she is getting the pain shooting into the back of her head and into her right arm and hand. I suggest we send her to [facility] for evaluation of injections to Dr. Laurie Panda to see if it helps. MRI of the brain did not show any etiology for her symptoms but did show white matter changes which can cause memory loss if severe, these white matter changes are caused by aging and vascular risk factors so the best thing she can do is manage all her vascular risk factors but she had no strokes or anything in the brain to explain symptoms, this is coming from her neck as we suspected. thanks ?

## 2021-10-08 NOTE — Telephone Encounter (Addendum)
Spoke with the patient to see if she had spoken with her son and wanted to proceed with neurosurgery referral.  Patient states she did speak with her son and at this time they do not want to proceed with neurosurgery evaluation for injections.  She states she understands there is no cure for arthritis, however she would like to see if there is something she can do to alleviate her pain without getting regular injections.  She states she is in the process of seeing the hand specialist that she was referred to by Dr Jaynee Eagles.  He gave her a brace to wear at this time and they had discussed surgery but she there are still more questions and nothing has been planned.  She states she has a nerve conduction test on May 17th followed by seeing the hand surgeon again that same day.  She would like to follow-up afterward with Dr. Jaynee Eagles.  She has been scheduled for follow-up with Dr. Jaynee Eagles on Thursday, May 20 at 10:45 AM. Pt was very appreciative for the call.  ?

## 2021-10-15 DIAGNOSIS — E785 Hyperlipidemia, unspecified: Secondary | ICD-10-CM | POA: Diagnosis not present

## 2021-10-15 DIAGNOSIS — E559 Vitamin D deficiency, unspecified: Secondary | ICD-10-CM | POA: Diagnosis not present

## 2021-10-15 DIAGNOSIS — E749 Disorder of carbohydrate metabolism, unspecified: Secondary | ICD-10-CM | POA: Diagnosis not present

## 2021-10-22 DIAGNOSIS — M5430 Sciatica, unspecified side: Secondary | ICD-10-CM | POA: Diagnosis not present

## 2021-10-22 DIAGNOSIS — E559 Vitamin D deficiency, unspecified: Secondary | ICD-10-CM | POA: Diagnosis not present

## 2021-10-22 DIAGNOSIS — M2550 Pain in unspecified joint: Secondary | ICD-10-CM | POA: Diagnosis not present

## 2021-10-22 DIAGNOSIS — G4709 Other insomnia: Secondary | ICD-10-CM | POA: Diagnosis not present

## 2021-10-22 DIAGNOSIS — F411 Generalized anxiety disorder: Secondary | ICD-10-CM | POA: Diagnosis not present

## 2021-10-22 DIAGNOSIS — J301 Allergic rhinitis due to pollen: Secondary | ICD-10-CM | POA: Diagnosis not present

## 2021-10-22 DIAGNOSIS — I10 Essential (primary) hypertension: Secondary | ICD-10-CM | POA: Diagnosis not present

## 2021-10-22 DIAGNOSIS — K219 Gastro-esophageal reflux disease without esophagitis: Secondary | ICD-10-CM | POA: Diagnosis not present

## 2021-10-22 DIAGNOSIS — E785 Hyperlipidemia, unspecified: Secondary | ICD-10-CM | POA: Diagnosis not present

## 2021-10-23 ENCOUNTER — Ambulatory Visit (INDEPENDENT_AMBULATORY_CARE_PROVIDER_SITE_OTHER): Payer: Medicare PPO

## 2021-10-23 ENCOUNTER — Ambulatory Visit: Payer: Medicare PPO | Admitting: Orthopaedic Surgery

## 2021-10-23 DIAGNOSIS — G5601 Carpal tunnel syndrome, right upper limb: Secondary | ICD-10-CM | POA: Diagnosis not present

## 2021-10-23 DIAGNOSIS — M25562 Pain in left knee: Secondary | ICD-10-CM | POA: Diagnosis not present

## 2021-10-23 DIAGNOSIS — M67431 Ganglion, right wrist: Secondary | ICD-10-CM | POA: Diagnosis not present

## 2021-10-23 DIAGNOSIS — G8929 Other chronic pain: Secondary | ICD-10-CM

## 2021-10-23 DIAGNOSIS — G5621 Lesion of ulnar nerve, right upper limb: Secondary | ICD-10-CM | POA: Diagnosis not present

## 2021-10-23 DIAGNOSIS — G5613 Other lesions of median nerve, bilateral upper limbs: Secondary | ICD-10-CM | POA: Diagnosis not present

## 2021-10-23 NOTE — Progress Notes (Signed)
The patient is a very pleasant 86 year old female who comes in today to talk about knee replacement surgery for her left knee.  She has well-documented osteoarthritis of his left knee and was actually scheduled to have a left knee replacement at [facility] in June of this year.  However that surgeon is now leaving and they were going to set up with a different surgeon but she decided to come see me since I have operated on people she knows.  Her son is with her today as well.  She is active and her knee does hurt on a daily basis.  She has tried and failed all forms of conservative treatment and this is been going on for years and worsening.  There is an MRI from 2020 one of her left knee showing significant arthritic changes.  She said at this point her knee pain can be 10 out of 10 and it is detrimentally affecting her mobility, her quality of life and actives daily living.  She says it is a constant ache for her as well.  She is interested still in knee replacement surgery.  I did review her medications.  She is a prediabetic.  She does not have any significant medical issues.  She does live alone but has good family support.  She currently denies any headache, chest pain, shortness of breath, fever, chills, nausea, vomiting.  I did reveal all of her other medical history as it relates to my findings in epic. ? ?Examination of her left knee shows varus malalignment that is easily correctable.  There is significant medial joint line tenderness and patellofemoral tenderness and crepitation.  She has good range of motion of the knee but it is very painful throughout the arc of motion. ? ?X-rays of her left knee and a previous MRI reviewed.  There is significant cartilage wear on the medial compartment of her knee and the patellofemoral joint as well as irregularities of the weightbearing surface of the medial compartment of the knee.  There is varus malalignment and definitely tricompartmental arthritic changes. ? ?At  this point I did show her knee replacement model and described in detail what this type of surgery involves.  She is interested in short-term skilled nursing following surgery since she does live alone and understands that this can be set up since there is no family support for her but this needs only be short-term.  I described what surgery involves in detail.  We talked about the intraoperative and postoperative course and as well as discussed the risk and benefits of surgery.  All questions and concerns were answered and addressed.  We will work on getting this scheduled for sometime in mid July.  I am waiting until then since she is having carpal tunnel surgery coming up on her hand and I would like that to be healed so she can use a walker as she is recovering from knee replacement surgery.  She understands this as well and agrees. ?

## 2021-10-30 DIAGNOSIS — L4050 Arthropathic psoriasis, unspecified: Secondary | ICD-10-CM | POA: Diagnosis not present

## 2021-10-30 DIAGNOSIS — M2550 Pain in unspecified joint: Secondary | ICD-10-CM | POA: Diagnosis not present

## 2021-10-30 DIAGNOSIS — Z8739 Personal history of other diseases of the musculoskeletal system and connective tissue: Secondary | ICD-10-CM | POA: Diagnosis not present

## 2021-10-30 DIAGNOSIS — M79643 Pain in unspecified hand: Secondary | ICD-10-CM | POA: Diagnosis not present

## 2021-10-30 DIAGNOSIS — M25512 Pain in left shoulder: Secondary | ICD-10-CM | POA: Diagnosis not present

## 2021-10-30 DIAGNOSIS — R21 Rash and other nonspecific skin eruption: Secondary | ICD-10-CM | POA: Diagnosis not present

## 2021-10-30 DIAGNOSIS — M1990 Unspecified osteoarthritis, unspecified site: Secondary | ICD-10-CM | POA: Diagnosis not present

## 2021-10-30 DIAGNOSIS — M25561 Pain in right knee: Secondary | ICD-10-CM | POA: Diagnosis not present

## 2021-10-31 ENCOUNTER — Ambulatory Visit: Payer: Medicare PPO | Admitting: Neurology

## 2021-11-07 ENCOUNTER — Other Ambulatory Visit: Payer: Self-pay | Admitting: Internal Medicine

## 2021-11-07 DIAGNOSIS — Z1231 Encounter for screening mammogram for malignant neoplasm of breast: Secondary | ICD-10-CM

## 2021-11-14 DIAGNOSIS — D2111 Benign neoplasm of connective and other soft tissue of right upper limb, including shoulder: Secondary | ICD-10-CM | POA: Diagnosis not present

## 2021-11-14 DIAGNOSIS — M67441 Ganglion, right hand: Secondary | ICD-10-CM | POA: Diagnosis not present

## 2021-11-14 DIAGNOSIS — G5601 Carpal tunnel syndrome, right upper limb: Secondary | ICD-10-CM | POA: Diagnosis not present

## 2021-11-14 DIAGNOSIS — M67431 Ganglion, right wrist: Secondary | ICD-10-CM | POA: Diagnosis not present

## 2021-11-14 DIAGNOSIS — M799 Soft tissue disorder, unspecified: Secondary | ICD-10-CM | POA: Diagnosis not present

## 2021-11-14 DIAGNOSIS — G5621 Lesion of ulnar nerve, right upper limb: Secondary | ICD-10-CM | POA: Diagnosis not present

## 2021-11-15 ENCOUNTER — Other Ambulatory Visit: Payer: Self-pay

## 2021-11-25 DIAGNOSIS — L400 Psoriasis vulgaris: Secondary | ICD-10-CM | POA: Diagnosis not present

## 2021-11-25 DIAGNOSIS — L309 Dermatitis, unspecified: Secondary | ICD-10-CM | POA: Diagnosis not present

## 2021-11-25 DIAGNOSIS — L989 Disorder of the skin and subcutaneous tissue, unspecified: Secondary | ICD-10-CM | POA: Diagnosis not present

## 2021-11-29 ENCOUNTER — Telehealth: Payer: Self-pay | Admitting: Orthopaedic Surgery

## 2021-11-29 ENCOUNTER — Telehealth: Payer: Self-pay | Admitting: *Deleted

## 2021-11-29 NOTE — Telephone Encounter (Signed)
Attempted pre-op call to patient to review Ortho bundle and any questions she may have related to her upcoming surgery. No answer and left VM with direct number for call back.

## 2021-12-02 DIAGNOSIS — M2550 Pain in unspecified joint: Secondary | ICD-10-CM | POA: Diagnosis not present

## 2021-12-02 DIAGNOSIS — R21 Rash and other nonspecific skin eruption: Secondary | ICD-10-CM | POA: Diagnosis not present

## 2021-12-02 DIAGNOSIS — L4050 Arthropathic psoriasis, unspecified: Secondary | ICD-10-CM | POA: Diagnosis not present

## 2021-12-02 DIAGNOSIS — M25561 Pain in right knee: Secondary | ICD-10-CM | POA: Diagnosis not present

## 2021-12-02 DIAGNOSIS — M79643 Pain in unspecified hand: Secondary | ICD-10-CM | POA: Diagnosis not present

## 2021-12-02 DIAGNOSIS — Z8739 Personal history of other diseases of the musculoskeletal system and connective tissue: Secondary | ICD-10-CM | POA: Diagnosis not present

## 2021-12-02 DIAGNOSIS — M1990 Unspecified osteoarthritis, unspecified site: Secondary | ICD-10-CM | POA: Diagnosis not present

## 2021-12-02 DIAGNOSIS — M25512 Pain in left shoulder: Secondary | ICD-10-CM | POA: Diagnosis not present

## 2021-12-23 ENCOUNTER — Other Ambulatory Visit: Payer: Self-pay | Admitting: Physician Assistant

## 2021-12-23 DIAGNOSIS — M1712 Unilateral primary osteoarthritis, left knee: Secondary | ICD-10-CM

## 2021-12-25 NOTE — Progress Notes (Signed)
Surgical Instructions    Your procedure is scheduled on Tuesday, July 25.  Report to [facility] ([place] Main Entrance "A" at 10:00 A.M., then check in with the Admitting office.  Call this number if you have problems the morning of surgery:  [phone]   If you have any questions prior to your surgery date call [phone]: Open Monday-Friday 8am-4pm    Remember:  Do not eat after midnight the night before your surgery  You may drink clear liquids until 9:00AM the morning of your surgery.   Clear liquids allowed are: Water, Non-Citrus Juices (without pulp), Carbonated Beverages, Clear Tea, Black Coffee ONLY (NO MILK, CREAM OR POWDERED CREAMER of any kind), and Gatorade    Take these medicines the morning of surgery with A SIP OF WATER:  fexofenadine (ALLEGRA)  acetaminophen (TYLENOL)  if needed   As of today, STOP taking any Aspirin (unless otherwise instructed by your surgeon) Aleve, Naproxen, Ibuprofen, Motrin, Advil, Goody's, BC's, all herbal medications, fish oil, and all vitamins.   HOW TO MANAGE YOUR DIABETES BEFORE AND AFTER SURGERY  Why is it important to control my blood sugar before and after surgery? Improving blood sugar levels before and after surgery helps healing and can limit problems. A way of improving blood sugar control is eating a healthy diet by:  Eating less sugar and carbohydrates  Increasing activity/exercise  Talking with your doctor about reaching your blood sugar goals High blood sugars (greater than 180 mg/dL) can raise your risk of infections and slow your recovery, so you will need to focus on controlling your diabetes during the weeks before surgery. Make sure that the doctor who takes care of your diabetes knows about your planned surgery including the date and location.  How do I manage my blood sugar before surgery? Check your blood sugar at least 4 times a day, starting 2 days before surgery, to make sure that the level is not too high or  low.  Check your blood sugar the morning of your surgery when you wake up and every 2 hours until you get to the Short Stay unit.  If your blood sugar is less than 70 mg/dL, you will need to treat for low blood sugar: Do not take insulin. Treat a low blood sugar (less than 70 mg/dL) with  cup of clear juice (cranberry or apple), 4 glucose tablets, OR glucose gel. Recheck blood sugar in 15 minutes after treatment (to make sure it is greater than 70 mg/dL). If your blood sugar is not greater than 70 mg/dL on recheck, call [phone] for further instructions. Report your blood sugar to the short stay nurse when you get to Short Stay.  If you are admitted to the hospital after surgery: Your blood sugar will be checked by the staff and you will probably be given insulin after surgery (instead of oral diabetes medicines) to make sure you have good blood sugar levels. The goal for blood sugar control after surgery is 80-180 mg/dL.  [facility] is not responsible for any belongings or valuables. .   Do NOT Smoke (Tobacco/Vaping)  24 hours prior to your procedure  If you use a CPAP at night, you may bring your mask for your overnight stay.   Contacts, glasses, hearing aids, dentures or partials may not be worn into surgery, please bring cases for these belongings   For patients admitted to the hospital, discharge time will be determined by your treatment team.   Patients discharged the day of surgery will  not be allowed to drive home, and someone needs to stay with them for 24 hours.   SURGICAL WAITING ROOM VISITATION Patients having surgery or a procedure may have no more than 2 support people in the waiting area - these visitors may rotate.   Children under the age of 78 must have an adult with them who is not the patient. If the patient needs to stay at the hospital during part of their recovery, the visitor guidelines for inpatient rooms apply. Pre-op nurse will coordinate an appropriate  time for 1 support person to accompany patient in pre-op.  This support person may not rotate.   Please refer to the [facility] website for the visitor guidelines for Inpatients (after your surgery is over and you are in a regular room).    Special instructions:    Oral Hygiene is also important to reduce your risk of infection.  Remember - BRUSH YOUR TEETH THE MORNING OF SURGERY WITH YOUR REGULAR TOOTHPASTE   [facility]- Preparing For Surgery  Before surgery, you can play an important role. Because skin is not sterile, your skin needs to be as free of germs as possible. You can reduce the number of germs on your skin by washing with CHG (chlorahexidine gluconate) Soap before surgery.  CHG is an antiseptic cleaner which kills germs and bonds with the skin to continue killing germs even after washing.     Please do not use if you have an allergy to CHG or antibacterial soaps. If your skin becomes reddened/irritated stop using the CHG.  Do not shave (including legs and underarms) for at least 48 hours prior to first CHG shower. It is OK to shave your face.  Please follow these instructions carefully.     Shower the NIGHT BEFORE SURGERY and the MORNING OF SURGERY with CHG Soap.   If you chose to wash your hair, wash your hair first as usual with your normal shampoo. After you shampoo, rinse your hair and body thoroughly to remove the shampoo.  Then [employer] and genitals (private parts) with your normal soap and rinse thoroughly to remove soap.  After that Use CHG Soap as you would any other liquid soap. You can apply CHG directly to the skin and wash gently with a scrungie or a clean washcloth.   Apply the CHG Soap to your body ONLY FROM THE NECK DOWN.  Do not use on open wounds or open sores. Avoid contact with your eyes, ears, mouth and genitals (private parts). Wash Face and genitals (private parts)  with your normal soap.   Wash thoroughly, paying special attention to the area where  your surgery will be performed.  Thoroughly rinse your body with warm water from the neck down.  DO NOT shower/wash with your normal soap after using and rinsing off the CHG Soap.  Pat yourself dry with a CLEAN TOWEL.  Wear CLEAN PAJAMAS to bed the night before surgery  Place CLEAN SHEETS on your bed the night before your surgery  DO NOT SLEEP WITH PETS.   Day of Surgery:  Take a shower with CHG soap. Wear Clean/Comfortable clothing the morning of surgery Do not wear jewelry or makeup Do not wear lotions, powders, perfumes/colognes, or deodorant. Do not shave 48 hours prior to surgery.  Men may shave face and neck. Do not bring valuables to the hospital. Do not wear nail polish, gel polish, artificial nails, or any other type of covering on natural nails (fingers and toes) If  you have artificial nails or gel coating that need to be removed by a nail salon, please have this removed prior to surgery. Artificial nails or gel coating may interfere with anesthesia's ability to adequately monitor your vital signs. Remember to brush your teeth WITH YOUR REGULAR TOOTHPASTE.    If you received a COVID test during your pre-op visit, it is requested that you wear a mask when out in public, stay away from anyone that may not be feeling well, and notify your surgeon if you develop symptoms. If you have been in contact with anyone that has tested positive in the last 10 days, please notify your surgeon.    Please read over the following fact sheets that you were given.

## 2021-12-26 ENCOUNTER — Encounter (HOSPITAL_COMMUNITY): Payer: Self-pay

## 2021-12-26 ENCOUNTER — Other Ambulatory Visit: Payer: Self-pay

## 2021-12-26 ENCOUNTER — Encounter (HOSPITAL_COMMUNITY)
Admission: RE | Admit: 2021-12-26 | Discharge: 2021-12-26 | Disposition: A | Discharge status: Home / Self Care | Payer: Medicare PPO | Source: Ambulatory Visit | Attending: Orthopaedic Surgery | Admitting: Orthopaedic Surgery

## 2021-12-26 VITALS — BP 151/85 | HR 69 | Temp 98.5°F | Resp 17 | Ht 67.0 in | Wt 164.8 lb

## 2021-12-26 DIAGNOSIS — M1712 Unilateral primary osteoarthritis, left knee: Secondary | ICD-10-CM | POA: Insufficient documentation

## 2021-12-26 DIAGNOSIS — I340 Nonrheumatic mitral (valve) insufficiency: Secondary | ICD-10-CM | POA: Diagnosis not present

## 2021-12-26 DIAGNOSIS — Z01818 Encounter for other preprocedural examination: Secondary | ICD-10-CM | POA: Diagnosis not present

## 2021-12-26 LAB — CBC
HCT: 40.6 % (ref 36.0–46.0)
Hemoglobin: 13 g/dL (ref 12.0–15.0)
MCH: 28.9 pg (ref 26.0–34.0)
MCHC: 32 g/dL (ref 30.0–36.0)
MCV: 90.2 fL (ref 80.0–100.0)
Platelets: 282 10*3/uL (ref 150–400)
RBC: 4.5 MIL/uL (ref 3.87–5.11)
RDW: 14.5 % (ref 11.5–15.5)
WBC: 7.6 10*3/uL (ref 4.0–10.5)
nRBC: 0 % (ref 0.0–0.2)

## 2021-12-26 LAB — BASIC METABOLIC PANEL
Anion gap: 10 (ref 5–15)
BUN: 17 mg/dL (ref 8–23)
CO2: 21 mmol/L — ABNORMAL LOW (ref 22–32)
Calcium: 9 mg/dL (ref 8.9–10.3)
Chloride: 107 mmol/L (ref 98–111)
Creatinine, Ser: 0.91 mg/dL (ref 0.44–1.00)
GFR, Estimated: >60 mL/min (ref >60)
Glucose, Bld: 97 mg/dL (ref 70–99)
Potassium: 4.1 mmol/L (ref 3.5–5.1)
Sodium: 138 mmol/L (ref 135–145)

## 2021-12-26 LAB — TYPE AND SCREEN
ABO/RH(D): O POS
Antibody Screen: NEGATIVE

## 2021-12-26 LAB — SURGICAL PCR SCREEN
MRSA, PCR: NEGATIVE
Staphylococcus aureus: POSITIVE — AB

## 2021-12-26 NOTE — Progress Notes (Signed)
PCP - Dorisann Frames Cardiologist - Isaias Cowman  PPM/ICD - denies  Chest x-ray - n/a EKG - 12/26/21 Stress Test - 01/27/17- care everywhere ECHO - denies Cardiac Cath - over 10 years ago- normal per patient  Sleep Study - denies   Follow your surgeon's instructions on when to stop Aspirin.  If no instructions were given by your surgeon then you will need to call the office to get those instructions.     ERAS Protcol -yes PRE-SURGERY Ensure or G2- ensure at 9am  COVID TEST- not neeed   Anesthesia review: no  Patient denies shortness of breath, fever, cough and chest pain at PAT appointment   All instructions explained to the patient, with a verbal understanding of the material. Patient agrees to go over the instructions while at home for a better understanding. Patient also instructed to self quarantine after being tested for COVID-19. The opportunity to ask questions was provided.

## 2021-12-30 ENCOUNTER — Telehealth: Payer: Self-pay | Admitting: *Deleted

## 2021-12-30 DIAGNOSIS — E785 Hyperlipidemia, unspecified: Secondary | ICD-10-CM | POA: Diagnosis not present

## 2021-12-30 DIAGNOSIS — Z789 Other specified health status: Secondary | ICD-10-CM | POA: Diagnosis not present

## 2021-12-30 DIAGNOSIS — I341 Nonrheumatic mitral (valve) prolapse: Secondary | ICD-10-CM | POA: Diagnosis not present

## 2021-12-30 DIAGNOSIS — I340 Nonrheumatic mitral (valve) insufficiency: Secondary | ICD-10-CM | POA: Diagnosis not present

## 2021-12-30 DIAGNOSIS — Z01810 Encounter for preprocedural cardiovascular examination: Secondary | ICD-10-CM | POA: Diagnosis not present

## 2021-12-30 DIAGNOSIS — Z9889 Other specified postprocedural states: Secondary | ICD-10-CM | POA: Diagnosis not present

## 2021-12-30 DIAGNOSIS — I10 Essential (primary) hypertension: Secondary | ICD-10-CM | POA: Diagnosis not present

## 2021-12-30 NOTE — Telephone Encounter (Signed)
Ortho bundle Pre-op call completed. 

## 2021-12-30 NOTE — Care Plan (Signed)
(  Late entry for 12/25/21) [facility] RNCM call to patient and spoke with her at length regarding her upcoming Left total knee arthroplasty with Dr. Ninfa Linden on 12/31/21. She is an Ortho bundle through [facility] and is agreeable to case management. She lives alone, but has a son, who has stated he has put in for FMLA to assist his mother at discharge. Patient states at first she would like to go to [facility] after discharge as she was the understanding she would get notably more physical therapy than she would at home then transitioning over to [facility]. Explained that the goal for an elective joint replacement is to go home if a caregiver can assist. Reviewed all post op care instructions. She does have a walker at home without wheels. Referral made to [facility]- choice provided. Will continue to follow for needs.

## 2021-12-31 ENCOUNTER — Observation Stay (HOSPITAL_COMMUNITY): Payer: Medicare PPO

## 2021-12-31 ENCOUNTER — Ambulatory Visit (HOSPITAL_COMMUNITY): Payer: Medicare PPO | Admitting: Anesthesiology

## 2021-12-31 ENCOUNTER — Other Ambulatory Visit: Payer: Self-pay

## 2021-12-31 ENCOUNTER — Telehealth: Payer: Self-pay | Admitting: *Deleted

## 2021-12-31 ENCOUNTER — Encounter (HOSPITAL_COMMUNITY): Payer: Self-pay | Admitting: Orthopaedic Surgery

## 2021-12-31 ENCOUNTER — Encounter (HOSPITAL_COMMUNITY)
Admission: RE | Disposition: A | Discharge status: Home Health | Payer: Self-pay | Source: Home / Self Care | Attending: Orthopaedic Surgery

## 2021-12-31 ENCOUNTER — Ambulatory Visit (HOSPITAL_BASED_OUTPATIENT_CLINIC_OR_DEPARTMENT_OTHER): Payer: Medicare PPO | Admitting: Anesthesiology

## 2021-12-31 ENCOUNTER — Observation Stay (HOSPITAL_COMMUNITY)
Admission: RE | Admit: 2021-12-31 | Discharge: 2022-01-02 | Disposition: A | Discharge status: Home Health | Payer: Medicare PPO | Attending: Orthopaedic Surgery | Admitting: Orthopaedic Surgery

## 2021-12-31 DIAGNOSIS — G4730 Sleep apnea, unspecified: Secondary | ICD-10-CM | POA: Diagnosis not present

## 2021-12-31 DIAGNOSIS — M179 Osteoarthritis of knee, unspecified: Secondary | ICD-10-CM | POA: Diagnosis present

## 2021-12-31 DIAGNOSIS — Z96652 Presence of left artificial knee joint: Secondary | ICD-10-CM | POA: Diagnosis not present

## 2021-12-31 DIAGNOSIS — Z471 Aftercare following joint replacement surgery: Secondary | ICD-10-CM | POA: Diagnosis not present

## 2021-12-31 DIAGNOSIS — M1712 Unilateral primary osteoarthritis, left knee: Principal | ICD-10-CM

## 2021-12-31 DIAGNOSIS — I10 Essential (primary) hypertension: Secondary | ICD-10-CM | POA: Insufficient documentation

## 2021-12-31 DIAGNOSIS — Z87891 Personal history of nicotine dependence: Secondary | ICD-10-CM | POA: Diagnosis not present

## 2021-12-31 DIAGNOSIS — G8918 Other acute postprocedural pain: Secondary | ICD-10-CM | POA: Diagnosis not present

## 2021-12-31 HISTORY — PX: TOTAL KNEE ARTHROPLASTY: SHX125

## 2021-12-31 LAB — ABO/RH: ABO/RH(D): O POS

## 2021-12-31 SURGERY — ARTHROPLASTY, KNEE, TOTAL
Anesthesia: Monitor Anesthesia Care | Site: Knee | Laterality: Left

## 2021-12-31 MED ORDER — 0.9 % SODIUM CHLORIDE (POUR BTL) OPTIME
TOPICAL | Status: DC | PRN
  Administered 2021-12-31: 1000 mL

## 2021-12-31 MED ORDER — FENTANYL CITRATE PF 50 MCG/ML IJ SOSY: 50.0000 ug | PREFILLED_SYRINGE | Freq: Once | INTRAMUSCULAR | Status: AC

## 2021-12-31 MED ORDER — LACTATED RINGERS IV SOLN: INTRAVENOUS | Status: DC | PRN

## 2021-12-31 MED ORDER — PHENOL 1.4 % MT LIQD: 1.0000 | OROMUCOSAL | Status: DC | PRN

## 2021-12-31 MED ORDER — OLMESARTAN MEDOXOMIL-HCTZ 20-12.5 MG PO TABS: 1.0000 | ORAL_TABLET | Freq: Every day | ORAL | Status: DC

## 2021-12-31 MED ORDER — IRBESARTAN 150 MG PO TABS
150.0000 mg | ORAL_TABLET | Freq: Every day | ORAL | Status: DC
  Administered 2021-12-31 – 2022-01-02 (×3): 150 mg via ORAL
  Filled 2021-12-31 (×3): qty 1

## 2021-12-31 MED ORDER — METHOCARBAMOL 500 MG PO TABS
500.0000 mg | ORAL_TABLET | Freq: Four times a day (QID) | ORAL | Status: DC | PRN
  Administered 2021-12-31 – 2022-01-01 (×3): 500 mg via ORAL
  Filled 2021-12-31 (×3): qty 1

## 2021-12-31 MED ORDER — EPHEDRINE SULFATE-NACL 50-0.9 MG/10ML-% IV SOSY
PREFILLED_SYRINGE | INTRAVENOUS | Status: DC | PRN
  Administered 2021-12-31: 10 mg via INTRAVENOUS

## 2021-12-31 MED ORDER — FENTANYL CITRATE (PF) 100 MCG/2ML IJ SOLN: 25.0000 ug | INTRAMUSCULAR | Status: DC | PRN

## 2021-12-31 MED ORDER — POVIDONE-IODINE 10 % EX SWAB: 2.0000 | Freq: Once | CUTANEOUS | Status: DC

## 2021-12-31 MED ORDER — EPHEDRINE 5 MG/ML INJ
INTRAVENOUS | Status: AC
  Filled 2021-12-31: qty 5

## 2021-12-31 MED ORDER — DOCUSATE SODIUM 100 MG PO CAPS
100.0000 mg | ORAL_CAPSULE | Freq: Two times a day (BID) | ORAL | Status: DC
  Administered 2021-12-31 – 2022-01-02 (×4): 100 mg via ORAL
  Filled 2021-12-31 (×4): qty 1

## 2021-12-31 MED ORDER — METOCLOPRAMIDE HCL 5 MG/ML IJ SOLN: 5.0000 mg | Freq: Three times a day (TID) | INTRAMUSCULAR | Status: DC | PRN

## 2021-12-31 MED ORDER — CHLORHEXIDINE GLUCONATE 0.12 % MT SOLN
15.0000 mL | Freq: Once | OROMUCOSAL | Status: AC
  Administered 2021-12-31: 15 mL via OROMUCOSAL
  Filled 2021-12-31: qty 15

## 2021-12-31 MED ORDER — DIPHENHYDRAMINE HCL 12.5 MG/5ML PO ELIX: 12.5000 mg | ORAL_SOLUTION | ORAL | Status: DC | PRN

## 2021-12-31 MED ORDER — TRANEXAMIC ACID-NACL 1000-0.7 MG/100ML-% IV SOLN
1000.0000 mg | INTRAVENOUS | Status: AC
  Administered 2021-12-31: 1000 mg via INTRAVENOUS
  Filled 2021-12-31: qty 100

## 2021-12-31 MED ORDER — HYDROMORPHONE HCL 1 MG/ML IJ SOLN
0.5000 mg | INTRAMUSCULAR | Status: DC | PRN
  Administered 2021-12-31: 1 mg via INTRAVENOUS
  Administered 2022-01-01 (×2): 0.5 mg via INTRAVENOUS
  Administered 2022-01-01: 1 mg via INTRAVENOUS
  Administered 2022-01-02: 0.5 mg via INTRAVENOUS
  Filled 2021-12-31 (×5): qty 1

## 2021-12-31 MED ORDER — ROPIVACAINE HCL 7.5 MG/ML IJ SOLN
INTRAMUSCULAR | Status: DC | PRN
  Administered 2021-12-31: 20 mL via PERINEURAL

## 2021-12-31 MED ORDER — SODIUM CHLORIDE 0.9 % IV SOLN: INTRAVENOUS | Status: DC

## 2021-12-31 MED ORDER — ONDANSETRON HCL 4 MG/2ML IJ SOLN: 4.0000 mg | Freq: Four times a day (QID) | INTRAMUSCULAR | Status: DC | PRN

## 2021-12-31 MED ORDER — LACTATED RINGERS IV SOLN: INTRAVENOUS | Status: DC

## 2021-12-31 MED ORDER — DEXAMETHASONE SODIUM PHOSPHATE 10 MG/ML IJ SOLN
INTRAMUSCULAR | Status: DC | PRN
  Administered 2021-12-31 (×2): 5 mg

## 2021-12-31 MED ORDER — MENTHOL 3 MG MT LOZG: 1.0000 | LOZENGE | OROMUCOSAL | Status: DC | PRN

## 2021-12-31 MED ORDER — MUPIROCIN 2 % EX OINT
TOPICAL_OINTMENT | CUTANEOUS | Status: AC
  Administered 2021-12-31: 1 via TOPICAL
  Filled 2021-12-31: qty 22

## 2021-12-31 MED ORDER — ORAL CARE MOUTH RINSE: 15.0000 mL | Freq: Once | OROMUCOSAL | Status: AC

## 2021-12-31 MED ORDER — OXYCODONE HCL 5 MG PO TABS
10.0000 mg | ORAL_TABLET | ORAL | Status: DC | PRN
  Administered 2021-12-31: 15 mg via ORAL
  Administered 2021-12-31: 10 mg via ORAL
  Administered 2022-01-01 – 2022-01-02 (×6): 15 mg via ORAL
  Filled 2021-12-31 (×7): qty 3

## 2021-12-31 MED ORDER — LIDOCAINE 2% (20 MG/ML) 5 ML SYRINGE
INTRAMUSCULAR | Status: AC
  Filled 2021-12-31: qty 5

## 2021-12-31 MED ORDER — PROPOFOL 500 MG/50ML IV EMUL
INTRAVENOUS | Status: DC | PRN
  Administered 2021-12-31: 100 ug/kg/min via INTRAVENOUS

## 2021-12-31 MED ORDER — ONDANSETRON HCL 4 MG PO TABS: 4.0000 mg | ORAL_TABLET | Freq: Four times a day (QID) | ORAL | Status: DC | PRN

## 2021-12-31 MED ORDER — ASPIRIN 81 MG PO CHEW
81.0000 mg | CHEWABLE_TABLET | Freq: Two times a day (BID) | ORAL | Status: DC
  Administered 2021-12-31 – 2022-01-02 (×4): 81 mg via ORAL
  Filled 2021-12-31 (×4): qty 1

## 2021-12-31 MED ORDER — FENTANYL CITRATE (PF) 100 MCG/2ML IJ SOLN
INTRAMUSCULAR | Status: AC
  Administered 2021-12-31: 50 ug
  Filled 2021-12-31: qty 2

## 2021-12-31 MED ORDER — ONDANSETRON HCL 4 MG/2ML IJ SOLN
INTRAMUSCULAR | Status: DC | PRN
  Administered 2021-12-31: 4 mg via INTRAVENOUS

## 2021-12-31 MED ORDER — ALUM & MAG HYDROXIDE-SIMETH 200-200-20 MG/5ML PO SUSP: 30.0000 mL | ORAL | Status: DC | PRN

## 2021-12-31 MED ORDER — GLYCOPYRROLATE 0.2 MG/ML IJ SOLN
INTRAMUSCULAR | Status: DC | PRN
  Administered 2021-12-31: .2 mg via INTRAVENOUS

## 2021-12-31 MED ORDER — OXYCODONE HCL 5 MG PO TABS
5.0000 mg | ORAL_TABLET | ORAL | Status: DC | PRN
  Filled 2021-12-31: qty 2

## 2021-12-31 MED ORDER — METHOCARBAMOL 1000 MG/10ML IJ SOLN: 500.0000 mg | Freq: Four times a day (QID) | INTRAVENOUS | Status: DC | PRN

## 2021-12-31 MED ORDER — ONDANSETRON HCL 4 MG/2ML IJ SOLN
INTRAMUSCULAR | Status: AC
  Filled 2021-12-31: qty 2

## 2021-12-31 MED ORDER — ACETAMINOPHEN 10 MG/ML IV SOLN: 1000.0000 mg | Freq: Once | INTRAVENOUS | Status: DC | PRN

## 2021-12-31 MED ORDER — VITAMIN D 25 MCG (1000 UNIT) PO TABS
1000.0000 [IU] | ORAL_TABLET | Freq: Every day | ORAL | Status: DC
  Administered 2021-12-31 – 2022-01-02 (×3): 1000 [IU] via ORAL
  Filled 2021-12-31 (×3): qty 1

## 2021-12-31 MED ORDER — MUPIROCIN 2 % EX OINT: 1.0000 | TOPICAL_OINTMENT | Freq: Two times a day (BID) | CUTANEOUS | Status: DC

## 2021-12-31 MED ORDER — PANTOPRAZOLE SODIUM 40 MG PO TBEC
40.0000 mg | DELAYED_RELEASE_TABLET | Freq: Every day | ORAL | Status: DC
  Administered 2021-12-31 – 2022-01-02 (×3): 40 mg via ORAL
  Filled 2021-12-31 (×3): qty 1

## 2021-12-31 MED ORDER — CEFAZOLIN SODIUM-DEXTROSE 2-4 GM/100ML-% IV SOLN
2.0000 g | INTRAVENOUS | Status: AC
  Administered 2021-12-31: 2 g via INTRAVENOUS
  Filled 2021-12-31: qty 100

## 2021-12-31 MED ORDER — LIDOCAINE HCL (CARDIAC) PF 100 MG/5ML IV SOSY
PREFILLED_SYRINGE | INTRAVENOUS | Status: DC | PRN
  Administered 2021-12-31: 100 mg via INTRATRACHEAL

## 2021-12-31 MED ORDER — HYDROCHLOROTHIAZIDE 12.5 MG PO TABS
12.5000 mg | ORAL_TABLET | Freq: Every day | ORAL | Status: DC
  Administered 2021-12-31 – 2022-01-02 (×3): 12.5 mg via ORAL
  Filled 2021-12-31 (×3): qty 1

## 2021-12-31 MED ORDER — DEXAMETHASONE SODIUM PHOSPHATE 10 MG/ML IJ SOLN
INTRAMUSCULAR | Status: AC
  Filled 2021-12-31: qty 1

## 2021-12-31 MED ORDER — CEFAZOLIN SODIUM-DEXTROSE 1-4 GM/50ML-% IV SOLN
1.0000 g | Freq: Four times a day (QID) | INTRAVENOUS | Status: AC
  Administered 2021-12-31 – 2022-01-01 (×2): 1 g via INTRAVENOUS
  Filled 2021-12-31 (×2): qty 50

## 2021-12-31 MED ORDER — GLYCOPYRROLATE PF 0.2 MG/ML IJ SOSY
PREFILLED_SYRINGE | INTRAMUSCULAR | Status: AC
  Filled 2021-12-31: qty 1

## 2021-12-31 MED ORDER — SODIUM CHLORIDE 0.9 % IR SOLN
Status: DC | PRN
  Administered 2021-12-31: 1000 mL

## 2021-12-31 MED ORDER — METOCLOPRAMIDE HCL 5 MG PO TABS: 5.0000 mg | ORAL_TABLET | Freq: Three times a day (TID) | ORAL | Status: DC | PRN

## 2021-12-31 MED ORDER — BUPIVACAINE IN DEXTROSE 0.75-8.25 % IT SOLN
INTRATHECAL | Status: DC | PRN
  Administered 2021-12-31: 1.6 mL via INTRATHECAL

## 2021-12-31 MED ORDER — ACETAMINOPHEN 325 MG PO TABS: 325.0000 mg | ORAL_TABLET | Freq: Four times a day (QID) | ORAL | Status: DC | PRN

## 2021-12-31 SURGICAL SUPPLY — 74 items
BAG COUNTER SPONGE SURGICOUNT (BAG) ×2 IMPLANT
BANDAGE ESMARK 6X9 LF (GAUZE/BANDAGES/DRESSINGS) ×1 IMPLANT
BLADE SAG 18X100X1.27 (BLADE) ×2 IMPLANT
BNDG ELASTIC 6X5.8 VLCR STR LF (GAUZE/BANDAGES/DRESSINGS) ×3 IMPLANT
BNDG ESMARK 6X9 LF (GAUZE/BANDAGES/DRESSINGS) ×2
BOWL SMART MIX CTS (DISPOSABLE) ×2 IMPLANT
CEMENT BONE R 1X40 (Cement) ×4 IMPLANT
COMP FEM CEMT PERSONA STD SZ7 (Knees) ×2 IMPLANT
COMP MED POLY AS PERS S6-7 12 (Joint) ×2 IMPLANT
COMPONENT FEM CMT PRSN STD SZ7 (Knees) IMPLANT
COMPONENT MED PLY PERSS6-7 12 (Joint) IMPLANT
COVER SURGICAL LIGHT HANDLE (MISCELLANEOUS) ×2 IMPLANT
CUFF TOURN SGL QUICK 34 (TOURNIQUET CUFF) ×2
CUFF TOURN SGL QUICK 42 (TOURNIQUET CUFF) IMPLANT
CUFF TRNQT CYL 34X4.125X (TOURNIQUET CUFF) ×1 IMPLANT
DRAPE EXTREMITY T 121X128X90 (DISPOSABLE) ×2 IMPLANT
DRAPE HALF SHEET 40X57 (DRAPES) ×2 IMPLANT
DRAPE U-SHAPE 47X51 STRL (DRAPES) ×2 IMPLANT
DRSG PAD ABDOMINAL 8X10 ST (GAUZE/BANDAGES/DRESSINGS) ×2 IMPLANT
DURAPREP 26ML APPLICATOR (WOUND CARE) ×2 IMPLANT
ELECT CAUTERY BLADE 6.4 (BLADE) ×2 IMPLANT
ELECT REM PT RETURN 9FT ADLT (ELECTROSURGICAL) ×2
ELECTRODE REM PT RTRN 9FT ADLT (ELECTROSURGICAL) ×1 IMPLANT
FACESHIELD WRAPAROUND (MASK) ×4 IMPLANT
FACESHIELD WRAPAROUND OR TEAM (MASK) ×2 IMPLANT
GAUZE PAD ABD 8X10 STRL (GAUZE/BANDAGES/DRESSINGS) ×1 IMPLANT
GAUZE SPONGE 4X4 12PLY STRL (GAUZE/BANDAGES/DRESSINGS) ×2 IMPLANT
GAUZE XEROFORM 1X8 LF (GAUZE/BANDAGES/DRESSINGS) ×2 IMPLANT
GAUZE XEROFORM 5X9 LF (GAUZE/BANDAGES/DRESSINGS) ×1 IMPLANT
GLOVE BIOGEL PI IND STRL 8 (GLOVE) ×2 IMPLANT
GLOVE BIOGEL PI INDICATOR 8 (GLOVE) ×2
GLOVE ORTHO TXT STRL SZ7.5 (GLOVE) ×2 IMPLANT
GLOVE SURG ORTHO 8.0 STRL STRW (GLOVE) ×2 IMPLANT
GOWN STRL REUS W/ TWL LRG LVL3 (GOWN DISPOSABLE) IMPLANT
GOWN STRL REUS W/ TWL XL LVL3 (GOWN DISPOSABLE) ×2 IMPLANT
GOWN STRL REUS W/TWL LRG LVL3 (GOWN DISPOSABLE)
GOWN STRL REUS W/TWL XL LVL3 (GOWN DISPOSABLE) ×4
HANDPIECE INTERPULSE COAX TIP (DISPOSABLE) ×2
HDLS TROCR DRIL PIN KNEE 75 (PIN) ×2
IMMOBILIZER KNEE 22 UNIV (SOFTGOODS) ×2 IMPLANT
IV NS 1000ML (IV SOLUTION) ×2
IV NS 1000ML BAXH (IV SOLUTION) ×1 IMPLANT
KIT BASIN OR (CUSTOM PROCEDURE TRAY) ×2 IMPLANT
KIT TURNOVER KIT B (KITS) ×2 IMPLANT
MANIFOLD NEPTUNE II (INSTRUMENTS) ×2 IMPLANT
NDL 18GX1X1/2 (RX/OR ONLY) (NEEDLE) IMPLANT
NEEDLE 18GX1X1/2 (RX/OR ONLY) (NEEDLE) IMPLANT
NS IRRIG 1000ML POUR BTL (IV SOLUTION) ×2 IMPLANT
PACK TOTAL JOINT (CUSTOM PROCEDURE TRAY) ×2 IMPLANT
PAD ARMBOARD 7.5X6 YLW CONV (MISCELLANEOUS) ×2 IMPLANT
PAD COLD SHLDR WRAP-ON (PAD) ×1 IMPLANT
PADDING CAST COTTON 6X4 STRL (CAST SUPPLIES) ×2 IMPLANT
PIN DRILL HDLS TROCAR 75 4PK (PIN) IMPLANT
SCREW FEMALE HEX FIX 25X2.5 (ORTHOPEDIC DISPOSABLE SUPPLIES) ×1 IMPLANT
SET HNDPC FAN SPRY TIP SCT (DISPOSABLE) ×1 IMPLANT
SET PAD KNEE POSITIONER (MISCELLANEOUS) ×2 IMPLANT
STAPLER VISISTAT 35W (STAPLE) ×2 IMPLANT
STEM POLY PAT PLY 32M KNEE (Knees) ×1 IMPLANT
STEM TIBIA 5 DEG SZ D L KNEE (Knees) IMPLANT
SUCTION FRAZIER HANDLE 10FR (MISCELLANEOUS) ×4
SUCTION TUBE FRAZIER 10FR DISP (MISCELLANEOUS) ×1 IMPLANT
SUT VIC AB 0 CT1 27 (SUTURE) ×2
SUT VIC AB 0 CT1 27XBRD ANBCTR (SUTURE) ×1 IMPLANT
SUT VIC AB 1 CT1 27 (SUTURE) ×4
SUT VIC AB 1 CT1 27XBRD ANBCTR (SUTURE) ×2 IMPLANT
SUT VIC AB 2-0 CT1 27 (SUTURE) ×4
SUT VIC AB 2-0 CT1 TAPERPNT 27 (SUTURE) ×2 IMPLANT
SYR 50ML LL SCALE MARK (SYRINGE) IMPLANT
TIBIA STEM 5 DEG SZ D L KNEE (Knees) ×2 IMPLANT
TOWEL GREEN STERILE (TOWEL DISPOSABLE) ×2 IMPLANT
TOWEL GREEN STERILE FF (TOWEL DISPOSABLE) ×2 IMPLANT
TRAY CATH 16FR W/PLASTIC CATH (SET/KITS/TRAYS/PACK) IMPLANT
TRAY FOLEY W/BAG SLVR 14FR (SET/KITS/TRAYS/PACK) ×1 IMPLANT
WRAP KNEE MAXI GEL POST OP (GAUZE/BANDAGES/DRESSINGS) ×2 IMPLANT

## 2021-12-31 NOTE — Anesthesia Preprocedure Evaluation (Addendum)
Anesthesia Evaluation  Patient identified by MRN, date of birth, ID band Patient awake    Reviewed: Allergy & Precautions, NPO status , Patient's Chart, lab work & pertinent test results  Airway Mallampati: II  TM Distance: >3 FB Neck ROM: Full    Dental  (+) Partial Lower, Poor Dentition   Pulmonary sleep apnea , former smoker,    Pulmonary exam normal        Cardiovascular hypertension, Pt. on medications  Rhythm:Regular Rate:Normal     Neuro/Psych  Headaches, negative psych ROS   GI/Hepatic negative GI ROS, Neg liver ROS,   Endo/Other  negative endocrine ROS  Renal/GU negative Renal ROS     Musculoskeletal  (+) Arthritis , Osteoarthritis,    Abdominal Normal abdominal exam  (+)   Peds  Hematology negative hematology ROS (+)   Anesthesia Other Findings   Reproductive/Obstetrics                            Anesthesia Physical Anesthesia Plan  ASA: 3  Anesthesia Plan: MAC, Regional and Spinal   Post-op Pain Management: Regional block*   Induction: Intravenous  PONV Risk Score and Plan: 2 and Ondansetron, Dexamethasone, Propofol infusion and Treatment may vary due to age or medical condition  Airway Management Planned: Simple Face Mask, Natural Airway and Nasal Cannula  Additional Equipment: None  Intra-op Plan:   Post-operative Plan:   Informed Consent: I have reviewed the patients History and Physical, chart, labs and discussed the procedure including the risks, benefits and alternatives for the proposed anesthesia with the patient or authorized representative who has indicated his/her understanding and acceptance.     Dental advisory given  Plan Discussed with: CRNA  Anesthesia Plan Comments: (Lab Results      Component                Value               Date                      WBC                      7.6                 12/26/2021                HGB                       13.0                12/26/2021                HCT                      40.6                12/26/2021                MCV                      90.2                12/26/2021                PLT  282                 12/26/2021           Lab Results      Component                Value               Date                      NA                       138                 12/26/2021                K                        4.1                 12/26/2021                CO2                      21 (L)              12/26/2021                GLUCOSE                  97                  12/26/2021                BUN                      17                  12/26/2021                CREATININE               0.91                12/26/2021                CALCIUM                  9.0                 12/26/2021                EGFR                     64                  08/29/2021                GFRNONAA                 >60                 12/26/2021          )        Anesthesia Quick Evaluation

## 2021-12-31 NOTE — Op Note (Signed)
Operative Note  Date of operation: 12/31/2021 Preoperative diagnosis: Osteoarthritis and degenerative joint disease left knee Postoperative diagnosis: Same  Procedure: Left cemented total knee arthroplasty  Implants: Biomet/Zimmer persona knee system with size 7 left standard CR femur, size D left tibial tray, 12 mm fixed-bearing medial congruent left polyethylene insert, 32 mm patella button  Surgeon: Mcarthur Rossetti, MD Assistant: Benita Stabile, PA-C  Anesthesia: #1 adductor canal block, #2 spinal Tourniquet time: Less than 1 hour Blood loss: Less than 100 cc Antibiotics: 2 g IV Ancef Complications: None  Indications: The patient is an active 86 year old female with well-documented severe end-stage arthritis of her left knee.  This has been getting worse over the last 4 years.  She has tried and failed all forms of conservative treatment.  At this point we recommended a total knee arthroplasty and she wishes to have this done given her daily knee pain that is detrimentally affecting her mobility, her quality of life, and her actives daily living.  With knee replacement surgery we talked about the risk of acute blood loss anemia, nerve or vessel injury, fracture, infection, DVT and wound issues.  We talked about her goals being decreased pain, improve mobility, and improve quality of life.  Procedure description: After informed consent was obtained and the left lower extremity was marked, and adductor canal blocks obtained in the holding room by anesthesia.  She was then brought to the operating room and set up on the operating table where spinal anesthesia obtained.  She was laid in a supine position on the operating table and a Foley catheter was placed.  A nonsterile tourniquet was placed around her upper left thigh and left thigh knee leg ankle and foot were prepped and draped with ChloraPrep and sterile drapes including a sterile stockinette.  A timeout was called and she was  notified of the correct patient and correct left knee we then used the Esmarch and wrapped out the knee and the tourniquet was inflated to 300 mm of pressure.  We then made a direct midline incision over the patella and carried this proximally distally.  We dissect down the joint carried out a medial parapatellar arthrotomy finding a large joint effusion.  We flexed the knee and removed osteophytes in all 3 compartments.  We removed the remnants of the medial lateral meniscus as well as the ACL.  We found significant cartilage wear especially in the medial compartment of the knee.  We then used an extramedullary cutting guide for making her proximal tibia cut correction varus and valgus and 3 degree slope.  We made our cut to take 2 mm off the low side.  We made this cut without difficulty.  We then used the intramedullary guide for making her distal femoral cut setting this for a left knee at 5 degrees externally rotated and a 10 mm distal femoral cut.  Limited echo without difficulty and brought the knee back down full extension and with a 10 mm extension block and achieve full extension.  We then moved back to the femur and used a femoral sizing guide based off the epicondylar axis.  Based off of this we chose a size 7 femur.  We put a form 1 cutting block in the knee for a size 7 femur and made her anterior and posterior cuts followed by our chamfer cuts.  We then went back to the tibia and chose a size D left tibial tray for coverage over the tibial plateau.  We set the  rotation of the tibial tubercle and the femur and then made our drill hole and keel punch off of this trial tibia.  We then placed our trial size 7 left femur and trialed a 10 mm medial congruent poly insert and went up to a 12 mm insert.  I was pleased with the range of motion and stability with the 12 mm medial congruent insert.  We then made a patella cut and drilled 3 holes for a size 32 patella button.  With all transportation the report of  the several cycles of motion we are pleased with range of motion and stability.  We then we will try complaint is from the knee.  We irrigated the knee with normal saline solution and then mixed our cement.  With the knee in a flexed position we dried it well and then cemented our real Biomet/Zimmer persona tibial tray size D for left knee followed by cementing our size 7 left standard CR femur.  Next we placed our real 12 mm thickness medial congruent fixed-bearing poly insert for her left knee and cemented our size 32 patella button.  We then held the knee fully extended and compressed to allow the cement to harden.  Once the cemented hardened we let the tourniquet down and hemostasis was obtained with electrocautery.  We then closed the arthrotomy with interrupted #1 Vicryl suture followed by 0 Vicryl to close the deep tissue and 2-0 Vicryl to close the subcutaneous tissue.  The skin was closed with staples.  Well-padded sterile dressings applied.  She was taken recovery in stable addition with all final counts being correct and no complications noted.  Of note Benita Stabile, PA-C assisted in entire case from beginning to end and his assistance was medically necessary and crucial for managing soft tissues and retract the soft tissues, helping guide implant placement and he was directly involved with layered closure of the wound.  Thank you

## 2021-12-31 NOTE — Anesthesia Procedure Notes (Signed)
Spinal  Patient location during procedure: OR Start time: 12/31/2021 12:14 PM End time: 12/31/2021 12:15 PM Staffing Performed: anesthesiologist  Anesthesiologist: Darral Dash, DO Performed by: Darral Dash, DO Authorized by: Darral Dash, DO   Preanesthetic Checklist Completed: patient identified, IV checked, site marked, risks and benefits discussed, surgical consent, monitors and equipment checked, pre-op evaluation and timeout performed Spinal Block Patient position: sitting Prep: DuraPrep Patient monitoring: heart rate, cardiac monitor, continuous pulse ox and blood pressure Approach: midline Location: L4-5 Injection technique: single-shot Needle Needle type: Pencan  Needle gauge: 24 G Needle length: 10 cm Assessment Events: CSF return Additional Notes Patient identified. Risks/Benefits/Options discussed with patient including but not limited to bleeding, infection, nerve damage, paralysis, failed block, incomplete pain control, headache, blood pressure changes, nausea, vomiting, reactions to medications, itching and postpartum back pain. Confirmed with bedside nurse the patient's most recent platelet count. Confirmed with patient that they are not currently taking any anticoagulation, have any bleeding history or any family history of bleeding disorders. Patient expressed understanding and wished to proceed. All questions were answered. Sterile technique was used throughout the entire procedure. Please see nursing notes for vital signs. Warning signs of high block given to the patient including shortness of breath, tingling/numbness in hands, complete motor block, or any concerning symptoms with instructions to call for help. Patient was given instructions on fall risk and not to get out of bed. All questions and concerns addressed with instructions to call with any issues or inadequate analgesia.

## 2021-12-31 NOTE — Transfer of Care (Signed)
Immediate Anesthesia Transfer of Care Note  Patient: Diana Sullivan  Procedure(s) Performed: LEFT TOTAL KNEE ARTHROPLASTY (Left: Knee)  Patient Location: PACU  Anesthesia Type:Spinal and MAC combined with regional for post-op pain  Level of Consciousness: awake, alert  and oriented  Airway & Oxygen Therapy: Patient Spontanous Breathing  Post-op Assessment: Report given to RN, Post -op Vital signs reviewed and stable and moving upper extremities  Post vital signs: Reviewed and stable  Last Vitals:  Vitals Value Taken Time  BP 116/85 12/31/21 1406  Temp    Pulse 65 12/31/21 1411  Resp 15 12/31/21 1411  SpO2 96 % 12/31/21 1411  Vitals shown include unvalidated device data.  Last Pain:  Vitals:   12/31/21 1140  TempSrc:   PainSc: 0-No pain      Patients Stated Pain Goal: 2 (53/96/[id number])  Complications: No notable events documented.

## 2021-12-31 NOTE — Anesthesia Procedure Notes (Signed)
Procedure Name: MAC Date/Time: 12/31/2021 12:17 PM  Performed by: Claris Che, CRNAPre-anesthesia Checklist: Patient identified, Emergency Drugs available, Suction available, Patient being monitored and Timeout performed Patient Re-evaluated:Patient Re-evaluated prior to induction Oxygen Delivery Method: Simple face mask Ventilation: Oral airway inserted - appropriate to patient size Dental Injury: Teeth and Oropharynx as per pre-operative assessment

## 2021-12-31 NOTE — H&P (Signed)
TOTAL KNEE ADMISSION H&P  Patient is being admitted for left total knee arthroplasty.  Subjective:  Chief Complaint:left knee pain.  HPI: Diana Sullivan, 86 y.o. female, has a history of pain and functional disability in the left knee due to arthritis and has failed non-surgical conservative treatments for greater than 12 weeks to includeNSAID's and/or analgesics, corticosteriod injections, viscosupplementation injections, flexibility and strengthening excercises, use of assistive devices, and activity modification.  Onset of symptoms was gradual, starting 4 years ago with gradually worsening course since that time. The patient noted no past surgery on the left knee(s).  Patient currently rates pain in the left knee(s) at 10 out of 10 with activity. Patient has night pain, worsening of pain with activity and weight bearing, pain that interferes with activities of daily living, pain with passive range of motion, crepitus, and joint swelling.  Patient has evidence of subchondral cysts, subchondral sclerosis, periarticular osteophytes, and joint space narrowing by imaging studies. There is no active infection.  Patient Active Problem List   Diagnosis Date Noted   Unilateral primary osteoarthritis, left knee 12/31/2021   Ganglion cyst 10/19/2019   Cervical radiculitis 09/30/2019   Neck pain 09/30/2019   Primary osteoarthritis of both hands 09/21/2019   Swelling of right ring finger 09/21/2019   Nondisplaced fracture of proximal phalanx of left lesser toe(s), initial encounter for open fracture 01/03/2019   Obstructive sleep apnea syndrome 12/14/2017   Generalized weakness 07/28/2017   Snoring 07/28/2017   Chronic right hip pain 10/01/2016   Generalized osteoarthritis of hand 10/01/2016   Trigger ring finger of left hand 10/01/2016   Localized, primary osteoarthritis of the ankle and foot, left 05/15/2014   Chest pain on exertion 01/30/2014   Primary localized osteoarthrosis of ankle and foot  11/16/2013   Statin intolerance 09/03/2013   Hyperlipidemia, unspecified 09/03/2013   Cataract 12/09/2010   Other abnormal glucose 12/09/2010   Mitral valve disorders(424.0) 12/09/2010   Hereditary progressive muscular dystrophy ([place]) 12/09/2010   Benign essential hypertension 12/09/2010   Mitral valve prolapse 12/09/2010   Nonrheumatic mitral valve regurgitation 12/09/2010   Essential hypertension 02/10/2008   S/P cardiac catheterization 06/18/2006   Past Medical History:  Diagnosis Date   Anginal pain ([place])    chest pain occasionally, checked out Dr [facility] (no Issues)   Arthritis    hands   Bronchitis    Complication of anesthesia    patient stated"They had trouble waking me up"   Elevated lipids    Gout    Headache    once every 2 or 3 weeks   History of recent fall    shoulder and knee pain, left side   Hypertension    essential hypertension-controlled on meds   Mitral valve disorder    Muscular dystrophy ([place])    Patient stated someone told her 25 years ago "that she had the gene for it" No issues   MVP (mitral valve prolapse)    Neuropathy    Pre-diabetes    watching diet   Wears partial dentures    upper and lower    Past Surgical History:  Procedure Laterality Date   ABDOMINAL HYSTERECTOMY     adenectomy     BACK SURGERY     BREAST SURGERY Right    biopsy, lumpectomy   BUNIONECTOMY Bilateral    CARDIAC CATHETERIZATION     no issue   CARPAL TUNNEL RELEASE Right    dr Fredna Dow   CATARACT EXTRACTION Left 12/06/2012   CATARACT  EXTRACTION W/PHACO Right 11/14/2019   Procedure: CATARACT EXTRACTION PHACO AND INTRAOCULAR LENS PLACEMENT (IOC) RIGHT 4.76  00:44.4;  Surgeon: Eulogio Bear, MD;  Location: [place];  Service: Ophthalmology;  Laterality: Right;   COLONOSCOPY N/A 04/21/2016   Procedure: COLONOSCOPY;  Surgeon: Manya Silvas, MD;  Location: [facility];  Service: Endoscopy;  Laterality: N/A;  Diabetic   COLONOSCOPY  N/A 06/05/2021   Procedure: COLONOSCOPY;  Surgeon: Toledo, Benay Pike, MD;  Location: [facility] ENDOSCOPY;  Service: Gastroenterology;  Laterality: N/A;   EYE SURGERY     metatarsal osteotomy Left 03/18/2013   TONSILLECTOMY     TYMPANOSTOMY TUBE PLACEMENT      Current Facility-Administered Medications  Medication Dose Route Frequency Provider Last Rate Last Admin   ceFAZolin (ANCEF) IVPB 2g/100 mL premix  2 g Intravenous On Call to OR Pete Pelt, PA-C       fentaNYL (SUBLIMAZE) 100 MCG/2ML injection            lactated ringers infusion   Intravenous Continuous Belinda Block, MD 10 mL/hr at 12/31/21 1120 New Bag at 12/31/21 1120   mupirocin ointment (BACTROBAN) 2 % 1 Application  1 Application Topical BID Mcarthur Rossetti, MD   1 Application at 26/94/85 1121   povidone-iodine 10 % swab 2 Application  2 Application Topical Once Pete Pelt, PA-C       tranexamic acid (CYKLOKAPRON) IVPB 1,000 mg  1,000 mg Intravenous To OR Pete Pelt, PA-C       Allergies  Allergen Reactions   Azithromycin Rash   Iodinated Contrast Media Itching and Swelling   Shellfish Allergy Itching and Swelling    Social History   Tobacco Use   Smoking status: Former    Packs/day: 0.25    Years: 27.00    Total pack years: 6.75    Types: Cigarettes    Quit date: 04/19/1994    Years since quitting: 27.7   Smokeless tobacco: Never   Tobacco comments:    QUIT 25 YEARS AGO  Substance Use Topics   Alcohol use: Yes    Comment: SOCIAL    Family History  Problem Relation Age of Onset   Breast cancer Neg Hx    Neuropathy Neg Hx      Review of Systems  Musculoskeletal:  Positive for gait problem and joint swelling.  All other systems reviewed and are negative.   Objective:  Physical Exam Vitals reviewed.  Constitutional:      Appearance: Normal appearance.  HENT:     Head: Normocephalic and atraumatic.  Eyes:     Extraocular Movements: Extraocular movements intact.     Pupils:  Pupils are equal, round, and reactive to light.  Cardiovascular:     Rate and Rhythm: Normal rate.  Pulmonary:     Breath sounds: Normal breath sounds.  Abdominal:     Palpations: Abdomen is soft.  Musculoskeletal:     Cervical back: Normal range of motion.     Left knee: Effusion, bony tenderness and crepitus present. Decreased range of motion. Tenderness present over the medial joint line and patellar tendon. Abnormal alignment and abnormal meniscus.  Neurological:     Mental Status: She is alert and oriented to person, place, and time.  Psychiatric:        Behavior: Behavior normal.     Vital signs in last 24 hours: Temp:  [98.3 F (36.8 C)] 98.3 F (36.8 C) (07/25 1103) Pulse Rate:  [67] 67 (07/25 1103) Resp:  [  17] 17 (07/25 1103) BP: (161)/(100) 161/100 (07/25 1103) SpO2:  [97 %] 97 % (07/25 1103) Weight:  [73.5 kg] 73.5 kg (07/25 1103)  Labs:   Estimated body mass index is 24.63 kg/m as calculated from the following:   Height as of this encounter: '5\' 8"'$  (1.727 m).   Weight as of this encounter: 73.5 kg.   Imaging Review Plain radiographs demonstrate severe degenerative joint disease of the left knee(s). The overall alignment ismild varus. The bone quality appears to be good for age and reported activity level.      Assessment/Plan:  End stage arthritis, left knee   The patient history, physical examination, clinical judgment of the provider and imaging studies are consistent with end stage degenerative joint disease of the left knee(s) and total knee arthroplasty is deemed medically necessary. The treatment options including medical management, injection therapy arthroscopy and arthroplasty were discussed at length. The risks and benefits of total knee arthroplasty were presented and reviewed. The risks due to aseptic loosening, infection, stiffness, patella tracking problems, thromboembolic complications and other imponderables were discussed. The patient  acknowledged the explanation, agreed to proceed with the plan and consent was signed. Patient is being admitted for inpatient treatment for surgery, pain control, PT, OT, prophylactic antibiotics, VTE prophylaxis, progressive ambulation and ADL's and discharge planning. The patient is planning to be discharged home with home health services

## 2021-12-31 NOTE — Anesthesia Procedure Notes (Signed)
Anesthesia Regional Block: Adductor canal block   Pre-Anesthetic Checklist: , timeout performed,  Correct Patient, Correct Site, Correct Laterality,  Correct Procedure, Correct Position, site marked,  Risks and benefits discussed,  Surgical consent,  Pre-op evaluation,  At surgeon's request and post-op pain management  Laterality: Left  Prep: Dura Prep       Needles:  Injection technique: Single-shot  Needle Type: Echogenic Stimulator Needle     Needle Length: 10cm  Needle Gauge: 20     Additional Needles:   Procedures:,,,, ultrasound used (permanent image in chart),,    Narrative:  Start time: 12/31/2021 11:27 AM End time: 12/31/2021 11:29 AM Injection made incrementally with aspirations every 5 mL.  Performed by: Personally  Anesthesiologist: Darral Dash, DO  Additional Notes: Patient identified. Risks/Benefits/Options discussed with patient including but not limited to bleeding, infection, nerve damage, failed block, incomplete pain control. Patient expressed understanding and wished to proceed. All questions were answered. Sterile technique was used throughout the entire procedure. Please see nursing notes for vital signs. Aspirated in 5cc intervals with injection for negative confirmation. Patient was given instructions on fall risk and not to get out of bed. All questions and concerns addressed with instructions to call with any issues or inadequate analgesia.

## 2021-12-31 NOTE — Evaluation (Signed)
Physical Therapy Evaluation Patient Details Name: Diana Sullivan MRN: [id number] DOB: Jan 03, 1936 Today's Date: 12/31/2021  History of Present Illness  Pt is 86 yo female s/p L TKA on 12/31/21 with hx including OA, HLD, mitral valve disorder, HTN, back sx, recent R carpal tunnel release.  Clinical Impression  Pt is s/p TKA resulting in the deficits listed below (see PT Problem List). At baseline, pt is active and independent.  Pt lives alone but will have support from sons at d/c.  She does not have a RW.  Today, pt did very well for DOS.  She was able to SLR with no ext lag and ambulated 60' with min guard.  L knee ROM 0 to 80 degrees and pain minimal.  Pt motivated and expected to progress very well. Pt will benefit from skilled PT to increase their independence and safety with mobility to allow discharge to the venue listed below.         Recommendations for follow up therapy are one component of a multi-disciplinary discharge planning process, led by the attending physician.  Recommendations may be updated based on patient status, additional functional criteria and insurance authorization.  Follow Up Recommendations Follow physician's recommendations for discharge plan and follow up therapies      Assistance Recommended at Discharge Intermittent Supervision/Assistance  Patient can return home with the following  A little help with walking and/or transfers;A little help with bathing/dressing/bathroom;Assistance with cooking/housework;Help with stairs or ramp for entrance    Equipment Recommendations Rolling walker (2 wheels)  Recommendations for Other Services       Functional Status Assessment Patient has had a recent decline in their functional status and demonstrates the ability to make significant improvements in function in a reasonable and predictable amount of time.     Precautions / Restrictions Precautions Precautions: Fall Required Braces or Orthoses: Knee Immobilizer -  Left Knee Immobilizer - Left: Discontinue once straight leg raise with < 10 degree lag Restrictions Weight Bearing Restrictions: Yes LLE Weight Bearing: Weight bearing as tolerated      Mobility  Bed Mobility Overal bed mobility: Needs Assistance Bed Mobility: Supine to Sit     Supine to sit: Supervision          Transfers Overall transfer level: Needs assistance Equipment used: Rolling walker (2 wheels) Transfers: Sit to/from Stand Sit to Stand: Min guard           General transfer comment: cues for hand placement and L LE management    Ambulation/Gait Ambulation/Gait assistance: Min guard Gait Distance (Feet): 60 Feet Assistive device: Rolling walker (2 wheels) Gait Pattern/deviations: Step-to pattern, Decreased stride length Gait velocity: decreased     General Gait Details: cues for sequencing and RW proximity  Stairs            Wheelchair Mobility    Modified Rankin (Stroke Patients Only)       Balance Overall balance assessment: Needs assistance Sitting-balance support: No upper extremity supported Sitting balance-Leahy Scale: Normal     Standing balance support: Bilateral upper extremity supported, Reliant on assistive device for balance Standing balance-Leahy Scale: Poor                               Pertinent Vitals/Pain Pain Assessment Pain Assessment: 0-10 Pain Score: 3  Pain Location: L knee Pain Descriptors / Indicators: Burning Pain Intervention(s): Limited activity within patient's tolerance, Monitored during session    Home Living  Family/patient expects to be discharged to:: Private residence Living Arrangements: Alone Available Help at Discharge: Family;Available 24 hours/day (son plans to stay for a few dayus) Type of Home: House Home Access: Stairs to enter   [employer] of Steps: 5 steps inside to get up to main level -rail on L; or front door 2 steps to main level no rail   Home Layout:  One level Home Equipment: Grab bars - tub/shower;Shower seat;Toilet riser;Standard Environmental consultant      Prior Function Prior Level of Function : Independent/Modified Independent;Driving             Mobility Comments: could ambulate in community without AD ADLs Comments: independent with ADLs and IADLs     Hand Dominance        Extremity/Trunk Assessment   Upper Extremity Assessment Upper Extremity Assessment: Overall WFL for tasks assessed;RUE deficits/detail RUE Deficits / Details: Recently had R UE carpal tunnel release - reports cleared for activity    Lower Extremity Assessment Lower Extremity Assessment: LLE deficits/detail LLE Deficits / Details: Expected post op changes; ROM: knee 0 to 80 degrees; MMT: at least 3/5 throughout , not further tested, able to SLR no ext lag    Cervical / Trunk Assessment Cervical / Trunk Assessment: Normal  Communication   Communication: No difficulties  Cognition Arousal/Alertness: Awake/alert Behavior During Therapy: WFL for tasks assessed/performed Overall Cognitive Status: Within Functional Limits for tasks assessed                                          General Comments General comments (skin integrity, edema, etc.): VSS    Exercises     Assessment/Plan    PT Assessment Patient needs continued PT services  PT Problem List Decreased strength;Decreased mobility;Decreased range of motion;Decreased balance;Decreased knowledge of use of DME;Pain       PT Treatment Interventions DME instruction;Therapeutic activities;Modalities;Gait training;Therapeutic exercise;Patient/family education;Stair training;Balance training;Functional mobility training    PT Goals (Current goals can be found in the Care Plan section)  Acute Rehab PT Goals Patient Stated Goal: return home PT Goal Formulation: With patient/family Time For Goal Achievement: 01/14/22 Potential to Achieve Goals: Good    Frequency 7X/week      Co-evaluation               AM-PAC PT "6 Clicks" Mobility  Outcome Measure Help needed turning from your back to your side while in a flat bed without using bedrails?: A Little Help needed moving from lying on your back to sitting on the side of a flat bed without using bedrails?: A Little Help needed moving to and from a bed to a chair (including a wheelchair)?: A Little Help needed standing up from a chair using your arms (e.g., wheelchair or bedside chair)?: A Little Help needed to walk in hospital room?: A Little Help needed climbing 3-5 steps with a railing? : A Little 6 Click Score: 18    End of Session Equipment Utilized During Treatment: Gait belt Activity Tolerance: Patient tolerated treatment well Patient left: in chair;with call bell/phone within reach;with family/visitor present (alert, oriented, follows commands) Nurse Communication: Mobility status PT Visit Diagnosis: Other abnormalities of gait and mobility (R26.89);Muscle weakness (generalized) (M62.81)    Time: 1710-1739 PT Time Calculation (min) (ACUTE ONLY): 29 min   Charges:   PT Evaluation $PT Eval Low Complexity: 1 Low PT Treatments $Gait Training: 8-22 mins  Abran Richard, PT Acute Rehab [facility] [place] 12/31/2021, 5:49 PM

## 2022-01-01 ENCOUNTER — Encounter (HOSPITAL_COMMUNITY): Payer: Self-pay | Admitting: Orthopaedic Surgery

## 2022-01-01 DIAGNOSIS — M1712 Unilateral primary osteoarthritis, left knee: Secondary | ICD-10-CM | POA: Diagnosis not present

## 2022-01-01 DIAGNOSIS — Z87891 Personal history of nicotine dependence: Secondary | ICD-10-CM | POA: Diagnosis not present

## 2022-01-01 DIAGNOSIS — I10 Essential (primary) hypertension: Secondary | ICD-10-CM | POA: Diagnosis not present

## 2022-01-01 LAB — CBC
HCT: 36.5 % (ref 36.0–46.0)
Hemoglobin: 12.2 g/dL (ref 12.0–15.0)
MCH: 29.5 pg (ref 26.0–34.0)
MCHC: 33.4 g/dL (ref 30.0–36.0)
MCV: 88.4 fL (ref 80.0–100.0)
Platelets: 284 10*3/uL (ref 150–400)
RBC: 4.13 MIL/uL (ref 3.87–5.11)
RDW: 14.4 % (ref 11.5–15.5)
WBC: 13 10*3/uL — ABNORMAL HIGH (ref 4.0–10.5)
nRBC: 0 % (ref 0.0–0.2)

## 2022-01-01 LAB — BASIC METABOLIC PANEL
Anion gap: 9 (ref 5–15)
BUN: 14 mg/dL (ref 8–23)
CO2: 25 mmol/L (ref 22–32)
Calcium: 9.2 mg/dL (ref 8.9–10.3)
Chloride: 99 mmol/L (ref 98–111)
Creatinine, Ser: 0.97 mg/dL (ref 0.44–1.00)
GFR, Estimated: 58 mL/min — ABNORMAL LOW (ref >60)
Glucose, Bld: 160 mg/dL — ABNORMAL HIGH (ref 70–99)
Potassium: 4.8 mmol/L (ref 3.5–5.1)
Sodium: 133 mmol/L — ABNORMAL LOW (ref 135–145)

## 2022-01-01 NOTE — Anesthesia Postprocedure Evaluation (Signed)
Anesthesia Post Note  Patient: Diana Sullivan  Procedure(s) Performed: LEFT TOTAL KNEE ARTHROPLASTY (Left: Knee)     Patient location during evaluation: PACU Anesthesia Type: Regional, MAC and Spinal Level of consciousness: awake and alert Pain management: pain level controlled Vital Signs Assessment: post-procedure vital signs reviewed and stable Respiratory status: spontaneous breathing, nonlabored ventilation, respiratory function stable and patient connected to nasal cannula oxygen Cardiovascular status: stable and blood pressure returned to baseline Postop Assessment: no apparent nausea or vomiting Anesthetic complications: no   No notable events documented.  Last Vitals:  Vitals:   01/01/22 1751 01/01/22 2017  BP: (!) 178/96 (!) 160/87  Pulse: 85 87  Resp: 18 17  Temp: 36.9 C 36.8 C  SpO2: 96% 97%    Last Pain:  Vitals:   01/01/22 2017  TempSrc: Oral  PainSc:                  March Rummage Giancarlos Berendt

## 2022-01-01 NOTE — Progress Notes (Signed)
Subjective: 1 Day Post-Op Procedure(s) (LRB): LEFT TOTAL KNEE ARTHROPLASTY (Left) Patient reports pain as moderate.    Objective: Vital signs in last 24 hours: Temp:  [97.3 F (36.3 C)-98.3 F (36.8 C)] 98.2 F (36.8 C) (07/26 0439) Pulse Rate:  [53-105] 85 (07/26 0439) Resp:  [14-18] 15 (07/26 0439) BP: (116-173)/(74-100) 159/89 (07/26 0439) SpO2:  [88 %-99 %] 88 % (07/26 0439) Weight:  [73.5 kg] 73.5 kg (07/25 1103)  Intake/Output from previous day: 07/25 0701 - 07/26 0700 In: 2504.2 [I.V.:2404.2; IV Piggyback:100] Out: 675 [Urine:650; Blood:25] Intake/Output this shift: No intake/output data recorded.  Recent Labs    01/01/22 0359  HGB 12.2   Recent Labs    01/01/22 0359  WBC 13.0*  RBC 4.13  HCT 36.5  PLT 284   Recent Labs    01/01/22 0359  NA 133*  K 4.8  CL 99  CO2 25  BUN 14  CREATININE 0.97  GLUCOSE 160*  CALCIUM 9.2   No results for input(s): "LABPT", "INR" in the last 72 hours.  Sensation intact distally Intact pulses distally Dorsiflexion/Plantar flexion intact Incision: scant drainage Compartment soft   Assessment/Plan: 1 Day Post-Op Procedure(s) (LRB): LEFT TOTAL KNEE ARTHROPLASTY (Left) Up with therapy Plan for discharge tomorrow Discharge home with home health      Mcarthur Rossetti 01/01/2022, 7:44 AM

## 2022-01-01 NOTE — Progress Notes (Signed)
Physical Therapy Treatment Patient Details Name: Diana Sullivan MRN: [id number] DOB: April 17, 1936 Today's Date: 01/01/2022   History of Present Illness Pt is 86 yo female s/p L TKA on 12/31/21 with hx including OA, HLD, mitral valve disorder, HTN, back sx, recent R carpal tunnel release.    PT Comments    Continuing work on functional mobility and activity tolerance;  Session focused on practicing bed mobility, gait, and stair in prep for dc home; Pt showed quite a repertoire of movement while practicing different ways to get in and out of a high bed, including trying a few ideas of her own; we landed on the technique of using RLE to support LLE to get LEs onto bed while still sitting up, then easing down to supine; Walked in hallway, and performed 5 stairs, overall managing well; On track for dc home tomorrow  Recommendations for follow up therapy are one component of a multi-disciplinary discharge planning process, led by the attending physician.  Recommendations may be updated based on patient status, additional functional criteria and insurance authorization.  Follow Up Recommendations  Follow physician's recommendations for discharge plan and follow up therapies     Assistance Recommended at Discharge Intermittent Supervision/Assistance  Patient can return home with the following A little help with walking and/or transfers;A little help with bathing/dressing/bathroom;Assistance with cooking/housework;Help with stairs or ramp for entrance   Equipment Recommendations  Rolling walker (2 wheels)    Recommendations for Other Services       Precautions / Restrictions Precautions Precautions: Fall Knee Immobilizer - Left: Discontinue once straight leg raise with < 10 degree lag (able to dc KI) Restrictions LLE Weight Bearing: Weight bearing as tolerated     Mobility  Bed Mobility Overal bed mobility: Needs Assistance Bed Mobility: Supine to Sit, Sit to Supine     Supine to sit:  Min guard Sit to supine: Min guard   General bed mobility comments: Practiced getting in and OOB from high bed to approximate home; used a few different technqiues, including belt to lift leg, and RLE under LLE for lift and support; Discussed moving the bed to allow her to get in on R side of bed, which may be easier    Transfers Overall transfer level: Needs assistance Equipment used: Rolling walker (2 wheels) Transfers: Sit to/from Stand Sit to Stand: Min guard           General transfer comment: cues for hand placement and L LE management    Ambulation/Gait Ambulation/Gait assistance: Min guard Gait Distance (Feet): 75 Feet (to stairwell adn back) Assistive device: Rolling walker (2 wheels) Gait Pattern/deviations: Step-through pattern (emerging) Gait velocity: decreased     General Gait Details: Cues to stand tall on LLE in stance; no buckling of L knee noted   Stairs Stairs: Yes Stairs assistance: Min guard Stair Management: One rail Left, Step to pattern, Sideways Number of Stairs: 5 General stair comments: Cues for sequence; managing well   Wheelchair Mobility    Modified Rankin (Stroke Patients Only)       Balance     Sitting balance-Leahy Scale: Normal       Standing balance-Leahy Scale: Poor (approaching Fair)                              Cognition Arousal/Alertness: Awake/alert Behavior During Therapy: WFL for tasks assessed/performed Overall Cognitive Status: Within Functional Limits for tasks assessed  Exercises Total Joint Exercises Quad Sets: AROM, Left, 10 reps Straight Leg Raises: AROM, Left, 10 reps Long Arc Quad: AROM, Left, 10 reps Knee Flexion: AROM, Left, 5 reps, Seated Goniometric ROM: approx 0-78deg    General Comments General comments (skin integrity, edema, etc.): Listened as pt expressed her concern and frustration re: slow assist this am after her  shower; son present and helpful during session      Pertinent Vitals/Pain Pain Assessment Pain Assessment: 0-10 Pain Score: 5  Pain Location: L knee Pain Descriptors / Indicators: Grimacing, Guarding Pain Intervention(s): Monitored during session, Premedicated before session, Ice applied    Home Living                          Prior Function            PT Goals (current goals can now be found in the care plan section) Acute Rehab PT Goals Patient Stated Goal: return home PT Goal Formulation: With patient/family Time For Goal Achievement: 01/14/22 Potential to Achieve Goals: Good Progress towards PT goals: Progressing toward goals    Frequency    7X/week      PT Plan Current plan remains appropriate    Co-evaluation              AM-PAC PT "6 Clicks" Mobility   Outcome Measure  Help needed turning from your back to your side while in a flat bed without using bedrails?: None Help needed moving from lying on your back to sitting on the side of a flat bed without using bedrails?: A Little Help needed moving to and from a bed to a chair (including a wheelchair)?: A Little Help needed standing up from a chair using your arms (e.g., wheelchair or bedside chair)?: A Little Help needed to walk in hospital room?: A Little Help needed climbing 3-5 steps with a railing? : A Little 6 Click Score: 19    End of Session Equipment Utilized During Treatment: Gait belt Activity Tolerance: Patient tolerated treatment well Patient left: in chair;with call bell/phone within reach;with family/visitor present Nurse Communication: Mobility status PT Visit Diagnosis: Other abnormalities of gait and mobility (R26.89);Muscle weakness (generalized) (M62.81)     Time: 0037-0488 PT Time Calculation (min) (ACUTE ONLY): 42 min  Charges:  $Gait Training: 8-22 mins $Therapeutic Exercise: 8-22 mins $Therapeutic Activity: 8-22 mins                     Roney Marion, PT   Acute Rehabilitation Services Office [phone]    [place] 01/01/2022, 3:37 PM

## 2022-01-01 NOTE — TOC Initial Note (Signed)
Transition of Care [facility]) - Initial/Assessment Note    Patient Details  Name: Diana Sullivan MRN: [id number] Date of Birth: 1936-03-26  [facility] CM/SW Contact:    Sharin Mons, RN Phone Number: 01/01/2022, 4:00 PM  Clinical Narrative:                    -s/p L TKA on 12/31/21  From home alone. States son to assist with care once d/c. PTA independent with ADL'S, no DME usage. Order noted for DME, RW. Referral made with [facility]. Equipment will be delivered to bedside prior to d/c. Pt declined BSC. Pt setup with [facility] by provider PTA for home health services.  Pt with transportation to home.  Post hospital f/u noted on AVS.  TOC team following for needs....     Expected Discharge Plan: [place] Barriers to Discharge: Continued Medical Work up   Patient Goals and CMS Choice     Choice offered to / list presented to : Patient  Expected Discharge Plan and Services Expected Discharge Plan: [place]                                   HH Arranged: PT [facility] Agency: [place] Date [place]: 01/01/22 Time [place] Agency Contacted: [phone] Representative spoke with at [place]: Marjory Lies  Prior Living Arrangements/Services                       Activities of Daily Living Home Assistive Devices/Equipment: Radio producer (specify quad or straight) ADL Screening (condition at time of admission) Patient's cognitive ability adequate to safely complete daily activities?: Yes Is the patient deaf or have difficulty hearing?: No Does the patient have difficulty seeing, even when wearing glasses/contacts?: No Does the patient have difficulty concentrating, remembering, or making decisions?: No Patient able to express need for assistance with ADLs?: Yes Does the patient have difficulty dressing or bathing?: No Independently performs ADLs?: Yes (appropriate for developmental age) Does the patient have  difficulty walking or climbing stairs?: Yes Weakness of Legs: Left Weakness of Arms/Hands: None  Permission Sought/Granted                  Emotional Assessment              Admission diagnosis:  OA (osteoarthritis) of knee [M17.9] Status post total left knee replacement [Z96.652] Patient Active Problem List   Diagnosis Date Noted   Unilateral primary osteoarthritis, left knee 12/31/2021   OA (osteoarthritis) of knee 12/31/2021   Status post total left knee replacement 12/31/2021   Ganglion cyst 10/19/2019   Cervical radiculitis 09/30/2019   Neck pain 09/30/2019   Primary osteoarthritis of both hands 09/21/2019   Swelling of right ring finger 09/21/2019   Nondisplaced fracture of proximal phalanx of left lesser toe(s), initial encounter for open fracture 01/03/2019   Obstructive sleep apnea syndrome 12/14/2017   Generalized weakness 07/28/2017   Snoring 07/28/2017   Chronic right hip pain 10/01/2016   Generalized osteoarthritis of hand 10/01/2016   Trigger ring finger of left hand 10/01/2016   Localized, primary osteoarthritis of the ankle and foot, left 05/15/2014   Chest pain on exertion 01/30/2014   Primary localized osteoarthrosis of ankle and foot 11/16/2013   Statin intolerance 09/03/2013   Hyperlipidemia, unspecified 09/03/2013   Cataract 12/09/2010   Other  abnormal glucose 12/09/2010   Mitral valve disorders(424.0) 12/09/2010   Hereditary progressive muscular dystrophy ([place]) 12/09/2010   Benign essential hypertension 12/09/2010   Mitral valve prolapse 12/09/2010   Nonrheumatic mitral valve regurgitation 12/09/2010   Essential hypertension 02/10/2008   S/P cardiac catheterization 06/18/2006   PCP:  Jodi Marble, MD Pharmacy:   [facility], [place] [address] Phone: [phone] Fax: [phone]     Social Determinants of Health (SDOH) Interventions    Readmission Risk  Interventions     No data to display

## 2022-01-01 NOTE — Plan of Care (Signed)

## 2022-01-01 NOTE — Progress Notes (Signed)
Physical Therapy Treatment Patient Details Name: Diana Sullivan MRN: [id number] DOB: 07-10-1935 Today's Date: 01/01/2022   History of Present Illness Pt is 86 yo female s/p L TKA on 12/31/21 with hx including OA, HLD, mitral valve disorder, HTN, back sx, recent R carpal tunnel release.    PT Comments    Pt reporting moderate L knee pain, but agreeable to session. Pt ambulatory for hallway distance with antalgic gait, requires cues for sequencing and postural adjustment. Pt tolerating TKA exercises well, progressing well. Plan for home tomorrow.     Recommendations for follow up therapy are one component of a multi-disciplinary discharge planning process, led by the attending physician.  Recommendations may be updated based on patient status, additional functional criteria and insurance authorization.  Follow Up Recommendations  Follow physician's recommendations for discharge plan and follow up therapies     Assistance Recommended at Discharge Intermittent Supervision/Assistance  Patient can return home with the following A little help with walking and/or transfers;A little help with bathing/dressing/bathroom;Assistance with cooking/housework;Help with stairs or ramp for entrance   Equipment Recommendations  Rolling walker (2 wheels)    Recommendations for Other Services       Precautions / Restrictions Precautions Precautions: Fall Knee Immobilizer - Left:  (able to dc KI) Restrictions LLE Weight Bearing: Weight bearing as tolerated     Mobility  Bed Mobility Overal bed mobility: Needs Assistance Bed Mobility: Supine to Sit, Sit to Supine     Supine to sit: Min guard Sit to supine: Min guard   General bed mobility comments: for safety, cues for using RLE to assist LLE when entering/exiting bed    Transfers Overall transfer level: Needs assistance Equipment used: Rolling walker (2 wheels) Transfers: Sit to/from Stand Sit to Stand: Min guard           General  transfer comment: cues for hand placement and L LE management    Ambulation/Gait Ambulation/Gait assistance: Supervision Gait Distance (Feet): 100 Feet Assistive device: Rolling walker (2 wheels) Gait Pattern/deviations: Step-to pattern, Step-through pattern, Decreased stance time - left, Antalgic Gait velocity: decreased     General Gait Details: cues for sequencing, upright posture, proximity to [employer] Mobility    Modified Rankin (Stroke Patients Only)       Balance Overall balance assessment: Needs assistance Sitting-balance support: No upper extremity supported Sitting balance-Leahy Scale: Normal     Standing balance support: Bilateral upper extremity supported, Reliant on assistive device for balance Standing balance-Leahy Scale: Poor                              Cognition Arousal/Alertness: Awake/alert Behavior During Therapy: WFL for tasks assessed/performed Overall Cognitive Status: Within Functional Limits for tasks assessed                                          Exercises Total Joint Exercises Quad Sets: AROM, Left, 10 reps Hip ABduction/ADduction: AROM, Left, 5 reps, Supine Knee Flexion: AROM, Left, 5 reps, Supine Goniometric ROM: knee flex/ext AROM approx 5-80 deg    General Comments General comments (skin integrity, edema, etc.): Listened as pt expressed her concern and frustration re: slow assist this am after her shower; son present and helpful during session  Pertinent Vitals/Pain Pain Assessment Pain Assessment: 0-10 Pain Score: 7  Pain Location: L knee, with WB Pain Descriptors / Indicators: Grimacing, Guarding Pain Intervention(s): Limited activity within patient's tolerance, Monitored during session, Repositioned    Home Living                          Prior Function            PT Goals (current goals can now be found in the care plan section) Acute  Rehab PT Goals Patient Stated Goal: return home PT Goal Formulation: With patient/family Time For Goal Achievement: 01/14/22 Potential to Achieve Goals: Good Progress towards PT goals: Progressing toward goals    Frequency    7X/week      PT Plan Current plan remains appropriate    Co-evaluation              AM-PAC PT "6 Clicks" Mobility   Outcome Measure  Help needed turning from your back to your side while in a flat bed without using bedrails?: None Help needed moving from lying on your back to sitting on the side of a flat bed without using bedrails?: A Little Help needed moving to and from a bed to a chair (including a wheelchair)?: A Little Help needed standing up from a chair using your arms (e.g., wheelchair or bedside chair)?: A Little Help needed to walk in hospital room?: A Little Help needed climbing 3-5 steps with a railing? : A Little 6 Click Score: 19    End of Session Equipment Utilized During Treatment: Gait belt Activity Tolerance: Patient tolerated treatment well Patient left: with call bell/phone within reach;with family/visitor present;in bed Nurse Communication: Mobility status PT Visit Diagnosis: Other abnormalities of gait and mobility (R26.89);Muscle weakness (generalized) (M62.81)     Time: 3888-2800 PT Time Calculation (min) (ACUTE ONLY): 24 min  Charges:  $Gait Training: 8-22 mins $Therapeutic Exercise: 8-22 mins                     Stacie Glaze, PT DPT Acute Rehabilitation Services Pager [phone]  Office [phone]    [place] 01/01/2022, 4:31 PM

## 2022-01-01 NOTE — Discharge Instructions (Signed)

## 2022-01-01 NOTE — Telephone Encounter (Signed)
Error

## 2022-01-01 NOTE — Evaluation (Signed)
Indwelling foley removed 0626. Catheter intact. Non-eventful. Patent in NAD.

## 2022-01-02 DIAGNOSIS — I10 Essential (primary) hypertension: Secondary | ICD-10-CM | POA: Diagnosis not present

## 2022-01-02 DIAGNOSIS — Z87891 Personal history of nicotine dependence: Secondary | ICD-10-CM | POA: Diagnosis not present

## 2022-01-02 DIAGNOSIS — M1712 Unilateral primary osteoarthritis, left knee: Secondary | ICD-10-CM | POA: Diagnosis not present

## 2022-01-02 MED ORDER — METHOCARBAMOL 500 MG PO TABS: 500.0000 mg | ORAL_TABLET | Freq: Four times a day (QID) | ORAL | 1 refills | Status: DC | PRN

## 2022-01-02 MED ORDER — ASPIRIN 81 MG PO CHEW: 81.0000 mg | CHEWABLE_TABLET | Freq: Two times a day (BID) | ORAL | 0 refills | Status: DC

## 2022-01-02 MED ORDER — OXYCODONE HCL 5 MG PO TABS: 5.0000 mg | ORAL_TABLET | Freq: Four times a day (QID) | ORAL | 0 refills | Status: DC | PRN

## 2022-01-02 NOTE — Progress Notes (Signed)
Physical Therapy Treatment Patient Details Name: Diana Sullivan MRN: [id number] DOB: 12-14-1935 Today's Date: 01/02/2022   History of Present Illness Pt is 86 yo female s/p L TKA on 12/31/21 with hx including OA, HLD, mitral valve disorder, HTN, back sx, recent R carpal tunnel release.    PT Comments    Patient progressing towards physical therapy goals. Patient increasing ambulation distance this session with supervision and use of RW. Cues for heel strike to encourage extension during mobility. Performed and reviewed HEP for continued LE strengthening. Patient complaining of dizziness during ambulation this session. D/c plan remains appropriate. Patient prefers to defer second PT session but will check back if patient hasn't discharged this PM.     Recommendations for follow up therapy are one component of a multi-disciplinary discharge planning process, led by the attending physician.  Recommendations may be updated based on patient status, additional functional criteria and insurance authorization.  Follow Up Recommendations  Follow physician's recommendations for discharge plan and follow up therapies     Assistance Recommended at Discharge Intermittent Supervision/Assistance  Patient can return home with the following A little help with walking and/or transfers;A little help with bathing/dressing/bathroom;Assistance with cooking/housework;Help with stairs or ramp for entrance   Equipment Recommendations  Rolling Zakee Deerman (2 wheels)    Recommendations for Other Services       Precautions / Restrictions Precautions Precautions: Fall Knee Immobilizer - Left: Other (comment) (Able to discontinue KI) Restrictions Weight Bearing Restrictions: Yes LLE Weight Bearing: Weight bearing as tolerated     Mobility  Bed Mobility Overal bed mobility: Needs Assistance Bed Mobility: Supine to Sit     Supine to sit: Supervision     General bed mobility comments: supervision for safety.  Able to use R LE to assist L LE off the bed without physical assistance    Transfers Overall transfer level: Needs assistance Equipment used: Rolling Stuart Mirabile (2 wheels) Transfers: Sit to/from Stand Sit to Stand: Supervision           General transfer comment: supervision for safety. Cues for hand placement    Ambulation/Gait Ambulation/Gait assistance: Supervision Gait Distance (Feet): 120 Feet Assistive device: Rolling Arianis Bowditch (2 wheels) Gait Pattern/deviations: Step-through pattern, Decreased stance time - left, Antalgic Gait velocity: decreased     General Gait Details: cues for upright posture and heel strike. Supervision for safety. seated rest break x 1 after 90' due to feeling dizzy.   Stairs         General stair comments: patient declined stair negotiation this date stating "it will drain me and I won't go home today". Educated patient on up with good and down with bad for safe stair negotiation   Wheelchair Mobility    Modified Rankin (Stroke Patients Only)       Balance Overall balance assessment: Needs assistance Sitting-balance support: No upper extremity supported Sitting balance-Leahy Scale: Normal     Standing balance support: Bilateral upper extremity supported, Reliant on assistive device for balance Standing balance-Leahy Scale: Poor                              Cognition Arousal/Alertness: Awake/alert Behavior During Therapy: WFL for tasks assessed/performed Overall Cognitive Status: Within Functional Limits for tasks assessed  Exercises Total Joint Exercises Ankle Circles/Pumps: AROM, Both, 10 reps (long sitting) Quad Sets: AROM, Left, 5 reps Heel Slides: AROM, Left, 5 reps, Seated    General Comments        Pertinent Vitals/Pain Pain Assessment Pain Assessment: Faces Faces Pain Scale: Hurts even more Pain Location: L knee, with WB Pain Descriptors /  Indicators: Grimacing, Guarding, Sore Pain Intervention(s): Monitored during session, Limited activity within patient's tolerance    Home Living                          Prior Function            PT Goals (current goals can now be found in the care plan section) Acute Rehab PT Goals Patient Stated Goal: return home PT Goal Formulation: With patient/family Time For Goal Achievement: 01/14/22 Potential to Achieve Goals: Good Progress towards PT goals: Progressing toward goals    Frequency    7X/week      PT Plan Current plan remains appropriate    Co-evaluation              AM-PAC PT "6 Clicks" Mobility   Outcome Measure  Help needed turning from your back to your side while in a flat bed without using bedrails?: None Help needed moving from lying on your back to sitting on the side of a flat bed without using bedrails?: A Little Help needed moving to and from a bed to a chair (including a wheelchair)?: A Little Help needed standing up from a chair using your arms (e.g., wheelchair or bedside chair)?: A Little Help needed to walk in hospital room?: A Little Help needed climbing 3-5 steps with a railing? : A Little 6 Click Score: 19    End of Session   Activity Tolerance: Patient tolerated treatment well Patient left: in chair;with call bell/phone within reach;with family/visitor present Nurse Communication: Mobility status PT Visit Diagnosis: Other abnormalities of gait and mobility (R26.89);Muscle weakness (generalized) (M62.81)     Time: 1779-3903 PT Time Calculation (min) (ACUTE ONLY): 34 min  Charges:  $Gait Training: 8-22 mins $Therapeutic Exercise: 8-22 mins                     Diana Sullivan PT, DPT Acute Rehabilitation Services Office [phone]    Diana Sullivan 01/02/2022, 10:37 AM

## 2022-01-02 NOTE — Progress Notes (Signed)
Subjective: 2 Days Post-Op Procedure(s) (LRB): LEFT TOTAL KNEE ARTHROPLASTY (Left) Patient reports pain as moderate to severe. Reports walking in hallway with PT this Morning. Tolerating diet well.   Objective: Vital signs in last 24 hours: Temp:  [98.2 F (36.8 C)-98.7 F (37.1 C)] 98.5 F (36.9 C) (07/27 0731) Pulse Rate:  [77-91] 91 (07/27 0731) Resp:  [17-18] 18 (07/27 0731) BP: (121-178)/(87-96) 121/87 (07/27 0731) SpO2:  [94 %-100 %] 100 % (07/27 0731)  Intake/Output from previous day: 07/26 0701 - 07/27 0700 In: 360 [P.O.:360] Out: -  Intake/Output this shift: No intake/output data recorded.  Recent Labs    01/01/22 0359  HGB 12.2   Recent Labs    01/01/22 0359  WBC 13.0*  RBC 4.13  HCT 36.5  PLT 284   Recent Labs    01/01/22 0359  NA 133*  K 4.8  CL 99  CO2 25  BUN 14  CREATININE 0.97  GLUCOSE 160*  CALCIUM 9.2   No results for input(s): "LABPT", "INR" in the last 72 hours.  Intact pulses distally Dorsiflexion/Plantar flexion intact Incision: moderate drainage Compartment soft   Assessment/Plan: 2 Days Post-Op Procedure(s) (LRB): LEFT TOTAL KNEE ARTHROPLASTY (Left) Up with therapy If patient remains stable and progresses well with PT discharge to home later today.    Keith Felten 01/02/2022, 9:23 AM

## 2022-01-02 NOTE — Discharge Summary (Signed)
.Patient ID: Diana Sullivan MRN: [id number] DOB/AGE: 84-Dec-1937 84 y.o.  Admit date: 12/31/2021 Discharge date: 01/02/2022  Admission Diagnoses:  Principal Problem:   Unilateral primary osteoarthritis, left knee Active Problems:   OA (osteoarthritis) of knee   Status post total left knee replacement   Discharge Diagnoses:  Same  Past Medical History:  Diagnosis Date   Anginal pain ([place])    chest pain occasionally, checked out [facility] (no Issues)   Arthritis    hands   Bronchitis    Complication of anesthesia    patient stated"They had trouble waking me up"   Elevated lipids    Gout    Headache    once every 2 or 3 weeks   History of recent fall    shoulder and knee pain, left side   Hypertension    essential hypertension-controlled on meds   Mitral valve disorder    Muscular dystrophy ([place])    Patient stated someone told her 25 years ago "that she had the gene for it" No issues   MVP (mitral valve prolapse)    Neuropathy    Pre-diabetes    watching diet   Wears partial dentures    upper and lower    Surgeries: Procedure(s): LEFT TOTAL KNEE ARTHROPLASTY on 12/31/2021   Consultants:   Discharged Condition: Improved  Hospital Course: Diana Sullivan is an 86 y.o. female who was admitted 12/31/2021 for operative treatment ofUnilateral primary osteoarthritis, left knee. Patient has severe unremitting pain that affects sleep, daily activities, and work/hobbies. After pre-op clearance the patient was taken to the operating room on 12/31/2021 and underwent  Procedure(s): LEFT TOTAL KNEE ARTHROPLASTY.    Patient was given perioperative antibiotics:  Anti-infectives (From admission, onward)    Start     Dose/Rate Route Frequency Ordered Stop   12/31/21 1800  ceFAZolin (ANCEF) IVPB 1 g/50 mL premix        1 g 100 mL/hr over 30 Minutes Intravenous Every 6 hours 12/31/21 1624 01/01/22 0130   12/31/21 0945  ceFAZolin (ANCEF) IVPB 2g/100 mL premix         2 g 200 mL/hr over 30 Minutes Intravenous On call to O.R. 12/31/21 0934 12/31/21 1220        Patient was given sequential compression devices, early ambulation, and chemoprophylaxis to prevent DVT.  Patient benefited maximally from hospital stay and there were no complications.    Recent vital signs: Patient Vitals for the past 24 hrs:  BP Temp Temp src Pulse Resp SpO2  01/02/22 0731 121/87 98.5 F (36.9 C) Oral 91 18 100 %  01/01/22 2017 (!) 160/87 98.2 F (36.8 C) Oral 87 17 97 %  01/01/22 1751 (!) 178/96 98.5 F (36.9 C) Oral 85 18 96 %  01/01/22 1346 (!) 152/89 98.7 F (37.1 C) Oral 77 18 94 %     Recent laboratory studies:  Recent Labs    01/01/22 0359  WBC 13.0*  HGB 12.2  HCT 36.5  PLT 284  NA 133*  K 4.8  CL 99  CO2 25  BUN 14  CREATININE 0.97  GLUCOSE 160*  CALCIUM 9.2     Discharge Medications:   Allergies as of 01/02/2022       Reactions   Azithromycin Rash   Iodinated Contrast Media Itching, Swelling   Shellfish Allergy Itching, Swelling        Medication List     TAKE these medications    acetaminophen 500 MG tablet Commonly  known as: TYLENOL Take 500-1,000 mg by mouth every 6 (six) hours as needed for moderate pain.   aspirin 81 MG chewable tablet Chew 1 tablet (81 mg total) by mouth 2 (two) times daily.   cholecalciferol 25 MCG (1000 UNIT) tablet Commonly known as: VITAMIN D3 Take 1,000 Units by mouth daily.   diphenhydrAMINE 25 mg capsule Commonly known as: BENADRYL Take 25 mg by mouth at bedtime as needed for sleep.   EPINEPHrine 0.3 mg/0.3 mL Soaj injection Commonly known as: EPI-PEN Inject 0.3 mg into the muscle as needed for anaphylaxis.   fexofenadine 180 MG tablet Commonly known as: ALLEGRA Take 180 mg by mouth daily.   methocarbamol 500 MG tablet Commonly known as: ROBAXIN Take 1 tablet (500 mg total) by mouth every 6 (six) hours as needed for muscle spasms.   olmesartan-hydrochlorothiazide 20-12.5 MG  tablet Commonly known as: BENICAR HCT Take 1 tablet by mouth daily.   ONE TOUCH ULTRA 2 w/Device Kit   ONE TOUCH ULTRA TEST test strip Generic drug: glucose blood   oxyCODONE 5 MG immediate release tablet Commonly known as: Oxy IR/ROXICODONE Take 1 tablet (5 mg total) by mouth every 6 (six) hours as needed for moderate pain (pain score 4-6).   traZODone 50 MG tablet Commonly known as: DESYREL Take 50-100 mg by mouth at bedtime as needed for sleep.               Durable Medical Equipment  (From admission, onward)           Start     Ordered   12/31/21 1625  DME 3 n 1  Once        12/31/21 1624   12/31/21 1625  DME Walker rolling  Once       Question Answer Comment  Walker: With 5 Inch Wheels   Patient needs a walker to treat with the following condition Status post total knee replacement, left      12/31/21 1624            Diagnostic Studies: DG Knee Left Port  Result Date: 12/31/2021 CLINICAL DATA:  Post LEFT total knee arthroplasty EXAM: PORTABLE LEFT KNEE - 1-2 VIEW COMPARISON:  Portable exam 1415 hours compared to 10/23/2021 FINDINGS: Components of LEFT knee prosthesis identified in expected positions. No acute fracture, dislocation, or bone destruction. IMPRESSION: LEFT knee prosthesis without acute complication. Electronically Signed   By: Lavonia Dana M.D.   On: 12/31/2021 14:37    Disposition: Discharge disposition: 01-Home or [place]     Mcarthur Rossetti, MD Follow up in 2 week(s).   Specialty: Orthopedic Surgery Contact information: [place] [address] [place] Follow up.   Specialty: Home Health Services Why: Home health PT services will be provided by [facility] information: [address] [place] [address] [phone]                  Signed: Erskine Emery 01/02/2022, 9:51 AM

## 2022-01-02 NOTE — Plan of Care (Signed)

## 2022-01-03 ENCOUNTER — Telehealth: Payer: Self-pay | Admitting: *Deleted

## 2022-01-03 NOTE — Telephone Encounter (Signed)
D/C call to patient today. Son states she is doing extremely well. Pain is manageable, eating and sleeping good. HHPT out today already and that went well. Just an update. Thanks for your support on this one.

## 2022-01-07 ENCOUNTER — Other Ambulatory Visit: Payer: Self-pay | Admitting: *Deleted

## 2022-01-07 DIAGNOSIS — Z96652 Presence of left artificial knee joint: Secondary | ICD-10-CM

## 2022-01-08 ENCOUNTER — Other Ambulatory Visit: Payer: Self-pay | Admitting: Orthopaedic Surgery

## 2022-01-08 ENCOUNTER — Telehealth: Payer: Self-pay | Admitting: *Deleted

## 2022-01-08 MED ORDER — OXYCODONE HCL 5 MG PO TABS: 5.0000 mg | ORAL_TABLET | Freq: Four times a day (QID) | ORAL | 0 refills | Status: DC | PRN

## 2022-01-08 NOTE — Telephone Encounter (Signed)
Spoke with patient, who is doing extremely well. Would like refill of pain medication. Pharmacy in chart. Thanks.

## 2022-01-08 NOTE — Telephone Encounter (Signed)
Call to patient and updated. 

## 2022-01-15 ENCOUNTER — Telehealth: Payer: Self-pay | Admitting: *Deleted

## 2022-01-15 ENCOUNTER — Encounter: Payer: Self-pay | Admitting: Physician Assistant

## 2022-01-15 ENCOUNTER — Ambulatory Visit (INDEPENDENT_AMBULATORY_CARE_PROVIDER_SITE_OTHER): Payer: Medicare PPO | Admitting: Physician Assistant

## 2022-01-15 DIAGNOSIS — Z96652 Presence of left artificial knee joint: Secondary | ICD-10-CM

## 2022-01-15 NOTE — Telephone Encounter (Signed)
Ortho bundle 14 day in office meeting completed. No new needs today.

## 2022-01-15 NOTE — Progress Notes (Signed)
HPI: Diana Sullivan returns today status post left total knee arthroplasty 12/31/2021.  She is overall doing well.  She is on aspirin for DVT prophylaxis was on no aspirin prior to surgery.  He ranks her pain to be 8-9 out of 10 pain and is taking Tylenol and oxycodone as needed.  She denies any fevers, shortness of breath chest pain.  She does state that her lower extremities have been colder than they were before surgery and this bilaterally.  Physical exam: Left knee surgical incisions well approximated with staples no signs of infection.  Calf supple nontender she has full extension and flexion to 90 degrees of the left knee.  Dorsiflexion plantarflexion bilateral ankles intact.  Lower extremities well-perfused bilaterally.  Impression: Status post left total knee arthroplasty 12/31/2021  Plan: She is to transition to outpatient therapy to work on range of motion strengthening left knee.  Therapy has been set up already.  She will stay on 81 mg aspirin once daily for another week and discontinue as she is on no aspirin prior to surgery.  Staples removed Steri-Strips applied.  Scar tissue mobilization encouraged.  Will see her back in 4 weeks sooner if there is any questions concerns.  Questions were answered both patient and her son who is present throughout the exam today.

## 2022-01-16 ENCOUNTER — Ambulatory Visit: Payer: Medicare PPO | Attending: Orthopaedic Surgery | Admitting: Physical Therapy

## 2022-01-16 DIAGNOSIS — M6281 Muscle weakness (generalized): Secondary | ICD-10-CM | POA: Diagnosis not present

## 2022-01-16 DIAGNOSIS — M25662 Stiffness of left knee, not elsewhere classified: Secondary | ICD-10-CM | POA: Diagnosis not present

## 2022-01-16 DIAGNOSIS — M1712 Unilateral primary osteoarthritis, left knee: Secondary | ICD-10-CM | POA: Insufficient documentation

## 2022-01-16 DIAGNOSIS — Z96652 Presence of left artificial knee joint: Secondary | ICD-10-CM | POA: Diagnosis not present

## 2022-01-16 DIAGNOSIS — R269 Unspecified abnormalities of gait and mobility: Secondary | ICD-10-CM | POA: Diagnosis not present

## 2022-01-18 NOTE — Therapy (Signed)
OUTPATIENT PHYSICAL THERAPY LOWER EXTREMITY EVALUATION   Patient Name: Diana Sullivan MRN: [id number] DOB:March 08, 1936, 86 y.o., female Today's Date: 01/18/2022   PT End of Session - 01/18/22 1621     Visit Number 1    Number of Visits 17    Date for PT Re-Evaluation 03/13/22    PT Start Time 1548    PT Stop Time 1639    PT Time Calculation (min) 51 min             Past Medical History:  Diagnosis Date   Anginal pain ([place])    chest pain occasionally, checked out [facility] (no Issues)   Arthritis    hands   Bronchitis    Complication of anesthesia    patient stated"They had trouble waking me up"   Elevated lipids    Gout    Headache    once every 2 or 3 weeks   History of recent fall    shoulder and knee pain, left side   Hypertension    essential hypertension-controlled on meds   Mitral valve disorder    Muscular dystrophy ([place])    Patient stated someone told her 25 years ago "that she had the gene for it" No issues   MVP (mitral valve prolapse)    Neuropathy    Pre-diabetes    watching diet   Wears partial dentures    upper and lower   Past Surgical History:  Procedure Laterality Date   ABDOMINAL HYSTERECTOMY     adenectomy     BACK SURGERY     BREAST SURGERY Right    biopsy, lumpectomy   BUNIONECTOMY Bilateral    CARDIAC CATHETERIZATION     no issue   CARPAL TUNNEL RELEASE Right    dr Fredna Dow   CATARACT EXTRACTION Left 12/06/2012   CATARACT EXTRACTION W/PHACO Right 11/14/2019   Procedure: CATARACT EXTRACTION PHACO AND INTRAOCULAR LENS PLACEMENT (IOC) RIGHT 4.76  00:44.4;  Surgeon: Eulogio Bear, MD;  Location: [place];  Service: Ophthalmology;  Laterality: Right;   COLONOSCOPY N/A 04/21/2016   Procedure: COLONOSCOPY;  Surgeon: Manya Silvas, MD;  Location: [facility] ENDOSCOPY;  Service: Endoscopy;  Laterality: N/A;  Diabetic   COLONOSCOPY N/A 06/05/2021   Procedure: COLONOSCOPY;  Surgeon: Tol[place], Benay Pike, MD;   Location: [facility] ENDOSCOPY;  Service: Gastroenterology;  Laterality: N/A;   EYE SURGERY     metatarsal osteotomy Left 03/18/2013   TONSILLECTOMY     TOTAL KNEE ARTHROPLASTY Left 12/31/2021   Procedure: LEFT TOTAL KNEE ARTHROPLASTY;  Surgeon: Mcarthur Rossetti, MD;  Location: [place];  Service: Orthopedics;  Laterality: Left;   TYMPANOSTOMY TUBE PLACEMENT     Patient Active Problem List   Diagnosis Date Noted   Unilateral primary osteoarthritis, left knee 12/31/2021   OA (osteoarthritis) of knee 12/31/2021   Status post total left knee replacement 12/31/2021   Ganglion cyst 10/19/2019   Cervical radiculitis 09/30/2019   Neck pain 09/30/2019   Primary osteoarthritis of both hands 09/21/2019   Swelling of right ring finger 09/21/2019   Nondisplaced fracture of proximal phalanx of left lesser toe(s), initial encounter for open fracture 01/03/2019   Obstructive sleep apnea syndrome 12/14/2017   Generalized weakness 07/28/2017   Snoring 07/28/2017   Chronic right hip pain 10/01/2016   Generalized osteoarthritis of hand 10/01/2016   Trigger ring finger of left hand 10/01/2016   Localized, primary osteoarthritis of the ankle and foot, left 05/15/2014   Chest pain on exertion 01/30/2014  Primary localized osteoarthrosis of ankle and foot 11/16/2013   Statin intolerance 09/03/2013   Hyperlipidemia, unspecified 09/03/2013   Cataract 12/09/2010   Other abnormal glucose 12/09/2010   Mitral valve disorders(424.0) 12/09/2010   Hereditary progressive muscular dystrophy ([place]) 12/09/2010   Benign essential hypertension 12/09/2010   Mitral valve prolapse 12/09/2010   Nonrheumatic mitral valve regurgitation 12/09/2010   Essential hypertension 02/10/2008   S/P cardiac catheterization 06/18/2006    PCP: Jodi Marble, MD  REFERRING PROVIDER: Mcarthur Rossetti, MD  REFERRING DIAG: S/p L total knee replacement  THERAPY DIAG:  Unilateral primary osteoarthritis, left knee  Status  post total left knee replacement  Joint stiffness of knee, left  Muscle weakness (generalized)  Gait difficulty  Rationale for Evaluation and Treatment Rehabilitation  ONSET DATE: 12/31/21  (surgery date)  SUBJECTIVE:   SUBJECTIVE STATEMENT: Pt. S/p L TKA after chronic h/o knee OA/ pain.  Pt. Reports 5/10 L knee joint pain.  Pt. Returns to MD in 1 month for f/u.    PERTINENT HISTORY: Pt. Reports h/o L shoulder/R knee arthritis.  Pt. Lives alone in 1 story house.  Pt. Independent with driving prior to TKA.  Pt. Enjoys quilting and wants to return to gym.    PAIN:  Are you having pain? Yes: NPRS scale: 5/10 Pain location: L knee Pain description: sore/ aching Aggravating factors: bending Relieving factors: rest  PRECAUTIONS: None  WEIGHT BEARING RESTRICTIONS No  FALLS:  Has patient fallen in last 6 months? No  LIVING ENVIRONMENT: Lives with: lives with their family and lives alone Lives in: House/apartment Stairs: Yes: External: 5 steps; on left going up Has following equipment at home: Single point cane  OCCUPATION: retired  PLOF: Independent  PATIENT GOALS Increase L knee ROM/ strengthening and return to gym/ aquatic ex.    OBJECTIVE:   DIAGNOSTIC FINDINGS: PORTABLE LEFT KNEE - 1-2 VIEW   COMPARISON:  Portable exam 1415 hours compared to 10/23/2021   FINDINGS: Components of LEFT knee prosthesis identified in expected positions.   No acute fracture, dislocation, or bone destruction.   IMPRESSION: LEFT knee prosthesis without acute complication.     Electronically Signed   By: Lavonia Dana M.D.   On: 12/31/2021 14:37  PATIENT SURVEYS:  FOTO initial 40/ goal 52  COGNITION:  Overall cognitive status: Within functional limits for tasks assessed     SENSATION: WFL  EDEMA:  Circumferential: L/R joint line (45.5/ 39.5 cm.), distal quad (48.5/42.5 cm), mid-calf (38/ 34.5 cm)  POSTURE: rounded shoulders and forward head  PALPATION: Tenderness  around L knee joint/ patella  LOWER EXTREMITY ROM:  Active ROM Right eval Left eval  Hip flexion [facility]  Hip extension    Hip abduction    Hip adduction    Hip internal rotation    Hip external rotation    Knee flexion 137 deg. 99 deg.  Knee extension -4 deg. 0 deg.  Ankle dorsiflexion    Ankle plantarflexion    Ankle inversion    Ankle eversion     (Blank rows = not tested)  LOWER EXTREMITY MMT:  B LE muscle strength grossly 4+/5 MMT except L knee ext./ flexion 4/5 MMT.    GAIT: Distance walked: in clinic Assistive device utilized: Single point cane Level of assistance: Modified independence Comments: Pt. Has been using SPC for a week.  2-point gait pattern.    TODAY'S TREATMENT: Reviewed HEP/ Standing hip and knee ex. (No wt.).  Nustep L2 5 min. Seat #10.  PATIENT EDUCATION:  Education details: Reviewed HEP Person educated: Patient Education method: Customer service manager Education comprehension: verbalized understanding and returned demonstration  HOME EXERCISE PROGRAM: Will issue next tx. Session.    ASSESSMENT:  CLINICAL IMPRESSION: Patient is a pleasant 86 y.o.female who was seen today for physical therapy evaluation and treatment for s/p L TKA.  Pt. Reports 5/10 L knee pain and presents with limited AROM (-4 to 99 deg.).  Pt. Currently ambulating with SPC and 2-point gait pattern with limited L hip/knee flexion.  Pt. Will benefit from skilled PT services to increase L knee ROM/ strengthening to improve pain-free mobility/ return to gym.     OBJECTIVE IMPAIRMENTS Abnormal gait, decreased activity tolerance, decreased balance, decreased endurance, decreased mobility, difficulty walking, decreased ROM, decreased strength, hypomobility, increased edema, impaired flexibility, and pain.   ACTIVITY LIMITATIONS carrying, lifting, standing, squatting, stairs, transfers, toileting, dressing, and locomotion level  PARTICIPATION LIMITATIONS: meal prep,  cleaning, laundry, medication management, driving, shopping, and community activity  PERSONAL FACTORS Age, Fitness, and Past/current experiences are also affecting patient's functional outcome.   REHAB POTENTIAL: Good  CLINICAL DECISION MAKING: Stable/uncomplicated  EVALUATION COMPLEXITY: Low   GOALS: Goals reviewed with patient? Yes  SHORT TERM GOALS: Target date: 02/13/22 Pt. Independent with HEP to increase L knee AROM (0 to 115 deg.) to improve pain-free mobility.   Baseline:  -4 to 99 deg. Goal status: INITIAL   LONG TERM GOALS: Target date: 03/13/22  Pt. Will increase FOTO to 52 to improve pain-free mobility.   Baseline: initial FOTO 40 Goal status: INITIAL  2.  Pt. Able to ascend/ descend stairs with recip. Pattern and no handrail safely.   Baseline:  Difficulty descending stairs (step to pattern).   Goal status: INITIAL  3.  Pt. Will ambulates community distances with recip pattern and no assistive device safely.   Baseline:  limited distances/ SPC use Goal status: INITIAL   PLAN: PT FREQUENCY: 2x/week  PT DURATION: 8 weeks  PLANNED INTERVENTIONS: Therapeutic exercises, Therapeutic activity, Neuromuscular re-education, Balance training, Gait training, Patient/Family education, Self Care, Joint mobilization, Aquatic Therapy, Cryotherapy, scar mobilization, and Manual therapy  PLAN FOR NEXT SESSION: Progress L knee ROM  Pura Spice, PT, DPT # [id number] 01/18/2022, 4:26 PM

## 2022-01-21 ENCOUNTER — Encounter: Payer: Self-pay | Admitting: Physical Therapy

## 2022-01-21 ENCOUNTER — Ambulatory Visit: Payer: Medicare PPO | Admitting: Physical Therapy

## 2022-01-21 DIAGNOSIS — R269 Unspecified abnormalities of gait and mobility: Secondary | ICD-10-CM | POA: Diagnosis not present

## 2022-01-21 DIAGNOSIS — M6281 Muscle weakness (generalized): Secondary | ICD-10-CM | POA: Diagnosis not present

## 2022-01-21 DIAGNOSIS — M1712 Unilateral primary osteoarthritis, left knee: Secondary | ICD-10-CM

## 2022-01-21 DIAGNOSIS — M25662 Stiffness of left knee, not elsewhere classified: Secondary | ICD-10-CM

## 2022-01-21 DIAGNOSIS — Z96652 Presence of left artificial knee joint: Secondary | ICD-10-CM | POA: Diagnosis not present

## 2022-01-21 NOTE — Therapy (Addendum)
OUTPATIENT PHYSICAL THERAPY LOWER EXTREMITY TREATMENT   Patient Name: Diana Sullivan MRN: [id number] DOB:[birth year], 86 y.o., female Today's Date: 01/21/2022   PT End of Session - 01/21/22 0857     Visit Number 2    Number of Visits 17    Date for PT Re-Evaluation 03/13/22    PT Start Time 0854    PT Stop Time 0944    PT Time Calculation (min) 50 min             Past Medical History:  Diagnosis Date   Anginal pain ([place])    chest pain occasionally, checked out [facility] (no Issues)   Arthritis    hands   Bronchitis    Complication of anesthesia    patient stated"They had trouble waking me up"   Elevated lipids    Gout    Headache    once every 2 or 3 weeks   History of recent fall    shoulder and knee pain, left side   Hypertension    essential hypertension-controlled on meds   Mitral valve disorder    Muscular dystrophy ([place])    Patient stated someone told her 25 years ago "that she had the gene for it" No issues   MVP (mitral valve prolapse)    Neuropathy    Pre-diabetes    watching diet   Wears partial dentures    upper and lower   Past Surgical History:  Procedure Laterality Date   ABDOMINAL HYSTERECTOMY     adenectomy     BACK SURGERY     BREAST SURGERY Right    biopsy, lumpectomy   BUNIONECTOMY Bilateral    CARDIAC CATHETERIZATION     no issue   CARPAL TUNNEL RELEASE Right    dr Fredna Dow   CATARACT EXTRACTION Left 12/06/2012   CATARACT EXTRACTION W/PHACO Right 11/14/2019   Procedure: CATARACT EXTRACTION PHACO AND INTRAOCULAR LENS PLACEMENT ([place]) RIGHT 4.76  00:44.4;  Surgeon: Eulogio Bear, MD;  Location: [place];  Service: Ophthalmology;  Laterality: Right;   COLONOSCOPY N/A 04/21/2016   Procedure: COLONOSCOPY;  Surgeon: Manya Silvas, MD;  Location: [facility] ENDOSCOPY;  Service: Endoscopy;  Laterality: N/A;  Diabetic   COLONOSCOPY N/A 06/05/2021   Procedure: COLONOSCOPY;  Surgeon: Toledo, Benay Pike, MD;   Location: [facility] ENDOSCOPY;  Service: Gastroenterology;  Laterality: N/A;   EYE SURGERY     metatarsal osteotomy Left 03/18/2013   TONSILLECTOMY     TOTAL KNEE ARTHROPLASTY Left 12/31/2021   Procedure: LEFT TOTAL KNEE ARTHROPLASTY;  Surgeon: Mcarthur Rossetti, MD;  Location: [place];  Service: Orthopedics;  Laterality: Left;   TYMPANOSTOMY TUBE PLACEMENT     Patient Active Problem List   Diagnosis Date Noted   Unilateral primary osteoarthritis, left knee 12/31/2021   OA (osteoarthritis) of knee 12/31/2021   Status post total left knee replacement 12/31/2021   Ganglion cyst 10/19/2019   Cervical radiculitis 09/30/2019   Neck pain 09/30/2019   Primary osteoarthritis of both hands 09/21/2019   Swelling of right ring finger 09/21/2019   Nondisplaced fracture of proximal phalanx of left lesser toe(s), initial encounter for open fracture 01/03/2019   Obstructive sleep apnea syndrome 12/14/2017   Generalized weakness 07/28/2017   Snoring 07/28/2017   Chronic right hip pain 10/01/2016   Generalized osteoarthritis of hand 10/01/2016   Trigger ring finger of left hand 10/01/2016   Localized, primary osteoarthritis of the ankle and foot, left 05/15/2014   Chest pain on exertion 01/30/2014  Primary localized osteoarthrosis of ankle and foot 11/16/2013   Statin intolerance 09/03/2013   Hyperlipidemia, unspecified 09/03/2013   Cataract 12/09/2010   Other abnormal glucose 12/09/2010   Mitral valve disorders(424.0) 12/09/2010   Hereditary progressive muscular dystrophy ([place]) 12/09/2010   Benign essential hypertension 12/09/2010   Mitral valve prolapse 12/09/2010   Nonrheumatic mitral valve regurgitation 12/09/2010   Essential hypertension 02/10/2008   S/P cardiac catheterization 06/18/2006    PCP: Jodi Marble, MD  REFERRING PROVIDER: Mcarthur Rossetti, MD  REFERRING DIAG: S/p L total knee replacement  THERAPY DIAG:  Unilateral primary osteoarthritis, left knee  Status  post total left knee replacement  Joint stiffness of knee, left  Muscle weakness (generalized)  Gait difficulty  Rationale for Evaluation and Treatment Rehabilitation  ONSET DATE: 12/31/21  (surgery date)  SUBJECTIVE:   SUBJECTIVE STATEMENT: Pt. States she returned to church this weekend and did well.  Pt. Using SPC with gait.  Pts. Steristrips are still in place and incision is healing well.  Pt. Has not taken a pain pill over past 2 days.    PERTINENT HISTORY: Pt. Reports h/o L shoulder/R knee arthritis.  Pt. Lives alone in 1 story house.  Pt. Independent with driving prior to TKA.  Pt. Enjoys quilting and wants to return to gym.    PAIN:  Are you having pain? Yes: NPRS scale: 5/10 Pain location: L knee Pain description: sore/ aching Aggravating factors: bending Relieving factors: rest  PRECAUTIONS: None  WEIGHT BEARING RESTRICTIONS No  FALLS:  Has patient fallen in last 6 months? No  LIVING ENVIRONMENT: Lives with: lives with their family and lives alone Lives in: House/apartment Stairs: Yes: External: 5 steps; on left going up Has following equipment at home: Single point cane  OCCUPATION: retired  PLOF: Independent  PATIENT GOALS Increase L knee ROM/ strengthening and return to gym/ aquatic ex.    OBJECTIVE:   DIAGNOSTIC FINDINGS: PORTABLE LEFT KNEE - 1-2 VIEW   COMPARISON:  Portable exam 1415 hours compared to 10/23/2021   FINDINGS: Components of LEFT knee prosthesis identified in expected positions.   No acute fracture, dislocation, or bone destruction.   IMPRESSION: LEFT knee prosthesis without acute complication.     Electronically Signed   By: Lavonia Dana M.D.   On: 12/31/2021 14:37  PATIENT SURVEYS:  FOTO initial 40/ goal 52  COGNITION:  Overall cognitive status: Within functional limits for tasks assessed     SENSATION: WFL  EDEMA:  Circumferential: L/R joint line (45.5/ 39.5 cm.), distal quad (48.5/42.5 cm), mid-calf (38/ 34.5  cm)  POSTURE: rounded shoulders and forward head  PALPATION: Tenderness around L knee joint/ patella  LOWER EXTREMITY ROM:  Active ROM Right eval Left eval  Hip flexion [facility]  Hip extension    Hip abduction    Hip adduction    Hip internal rotation    Hip external rotation    Knee flexion 137 deg. 99 deg.  Knee extension 0 deg. -4 deg.  Ankle dorsiflexion    Ankle plantarflexion    Ankle inversion    Ankle eversion     (Blank rows = not tested)  LOWER EXTREMITY MMT:  B LE muscle strength grossly 4+/5 MMT except L knee ext./ flexion 4/5 MMT.    GAIT: Distance walked: in clinic Assistive device utilized: Single point cane Level of assistance: Modified independence Comments: Pt. Has been using SPC for a week.  2-point gait pattern.    TODAY'S TREATMENT:  01/21/22:  Venida Jarvis  L4 8 min. Seat #9 with B UE/LE.  Walking in //-bars:  added high marching/ lateral walking/ hamstring curls/ partial squats heel raises 20x each.    2nd step L knee flexion 5x with static holds.  108 deg. On L knee in standing lunge position.    Walking around PT clinic with and without SPC and consistent recip. Gait pattern (3 laps in hallway/ clinic).   Discussed feet/ shoe wear.    Supine heel slides/ quad sets (manual feedback)/ SLR/ hip abduction 10x each with holds.    Recip. Ascending/ descending stairs 3x with 1 handrail/ SPC on R.    Walking in clinic/ outside with good L hip/knee flexion and step pattern.  No LOB.    Discussed HEP  Pt. Instructed to ice L knee at home.        PATIENT EDUCATION:  Education details: Reviewed HEP Person educated: Patient Education method: Customer service manager Education comprehension: verbalized understanding and returned demonstration  HOME EXERCISE PROGRAM: Will issue next tx. Session.    ASSESSMENT:  CLINICAL IMPRESSION: Pt. Entered PT with consistent recip. Pattern/ heel strike and use of SPC on R for 2-point gait.  Pt. Reports  5/10 L knee pain and has increase L knee flexion 108 deg.  Good understanding of HEP and importance of daily walking/ icing to manage swelling/ pain.  Pt. Will benefit from skilled PT services to increase L knee ROM/ strengthening to improve pain-free mobility/ return to gym.     OBJECTIVE IMPAIRMENTS Abnormal gait, decreased activity tolerance, decreased balance, decreased endurance, decreased mobility, difficulty walking, decreased ROM, decreased strength, hypomobility, increased edema, impaired flexibility, and pain.   ACTIVITY LIMITATIONS carrying, lifting, standing, squatting, stairs, transfers, toileting, dressing, and locomotion level  PARTICIPATION LIMITATIONS: meal prep, cleaning, laundry, medication management, driving, shopping, and community activity  PERSONAL FACTORS Age, Fitness, and Past/current experiences are also affecting patient's functional outcome.   REHAB POTENTIAL: Good  CLINICAL DECISION MAKING: Stable/uncomplicated  EVALUATION COMPLEXITY: Low   GOALS: Goals reviewed with patient? Yes  SHORT TERM GOALS: Target date: 02/13/22 Pt. Independent with HEP to increase L knee AROM (0 to 115 deg.) to improve pain-free mobility.   Baseline:  -4 to 99 deg. Goal status: INITIAL   LONG TERM GOALS: Target date: 03/13/22  Pt. Will increase FOTO to 52 to improve pain-free mobility.   Baseline: initial FOTO 40 Goal status: INITIAL  2.  Pt. Able to ascend/ descend stairs with recip. Pattern and no handrail safely.   Baseline:  Difficulty descending stairs (step to pattern).   Goal status: INITIAL  3.  Pt. Will ambulates community distances with recip pattern and no assistive device safely.   Baseline:  limited distances/ SPC use Goal status: INITIAL   PLAN: PT FREQUENCY: 2x/week  PT DURATION: 8 weeks  PLANNED INTERVENTIONS: Therapeutic exercises, Therapeutic activity, Neuromuscular re-education, Balance training, Gait training, Patient/Family education, Self Care,  Joint mobilization, Aquatic Therapy, Cryotherapy, scar mobilization, and Manual therapy  PLAN FOR NEXT SESSION: Progress L knee ROM  Pura Spice, PT, DPT # [phone] 01/21/2022, 9:45 AM

## 2022-01-23 ENCOUNTER — Ambulatory Visit: Payer: Medicare PPO | Admitting: Physical Therapy

## 2022-01-23 DIAGNOSIS — R269 Unspecified abnormalities of gait and mobility: Secondary | ICD-10-CM

## 2022-01-23 DIAGNOSIS — M6281 Muscle weakness (generalized): Secondary | ICD-10-CM

## 2022-01-23 DIAGNOSIS — Z96652 Presence of left artificial knee joint: Secondary | ICD-10-CM

## 2022-01-23 DIAGNOSIS — M25662 Stiffness of left knee, not elsewhere classified: Secondary | ICD-10-CM | POA: Diagnosis not present

## 2022-01-23 DIAGNOSIS — M1712 Unilateral primary osteoarthritis, left knee: Secondary | ICD-10-CM | POA: Diagnosis not present

## 2022-01-24 DIAGNOSIS — E749 Disorder of carbohydrate metabolism, unspecified: Secondary | ICD-10-CM | POA: Diagnosis not present

## 2022-01-24 DIAGNOSIS — E559 Vitamin D deficiency, unspecified: Secondary | ICD-10-CM | POA: Diagnosis not present

## 2022-01-24 DIAGNOSIS — E785 Hyperlipidemia, unspecified: Secondary | ICD-10-CM | POA: Diagnosis not present

## 2022-01-26 NOTE — Therapy (Signed)
OUTPATIENT PHYSICAL THERAPY LOWER EXTREMITY TREATMENT   Patient Name: Diana Sullivan MRN: [id number] DOB:08-10-1935, 86 y.o., female Today's Date: 01/26/2022   PT End of Session - 01/26/22 0923     Visit Number 3    Number of Visits 17    Date for PT Re-Evaluation 03/13/22    PT Start Time 0347    PT Stop Time 1814    PT Time Calculation (min) 51 min             Past Medical History:  Diagnosis Date   Anginal pain ([place])    chest pain occasionally, checked out [facility] (no Issues)   Arthritis    hands   Bronchitis    Complication of anesthesia    patient stated"They had trouble waking me up"   Elevated lipids    Gout    Headache    once every 2 or 3 weeks   History of recent fall    shoulder and knee pain, left side   Hypertension    essential hypertension-controlled on meds   Mitral valve disorder    Muscular dystrophy ([place])    Patient stated someone told her 25 years ago "that she had the gene for it" No issues   MVP (mitral valve prolapse)    Neuropathy    Pre-diabetes    watching diet   Wears partial dentures    upper and lower   Past Surgical History:  Procedure Laterality Date   ABDOMINAL HYSTERECTOMY     adenectomy     BACK SURGERY     BREAST SURGERY Right    biopsy, lumpectomy   BUNIONECTOMY Bilateral    CARDIAC CATHETERIZATION     no issue   CARPAL TUNNEL RELEASE Right    dr Fredna Dow   CATARACT EXTRACTION Left 12/06/2012   CATARACT EXTRACTION W/PHACO Right 11/14/2019   Procedure: CATARACT EXTRACTION PHACO AND INTRAOCULAR LENS PLACEMENT ([place]) RIGHT 4.76  00:44.4;  Surgeon: Eulogio Bear, MD;  Location: [place];  Service: Ophthalmology;  Laterality: Right;   COLONOSCOPY N/A 04/21/2016   Procedure: COLONOSCOPY;  Surgeon: Manya Silvas, MD;  Location: [facility] ENDOSCOPY;  Service: Endoscopy;  Laterality: N/A;  Diabetic   COLONOSCOPY N/A 06/05/2021   Procedure: COLONOSCOPY;  Surgeon: Toledo, Benay Pike, MD;   Location: [facility] ENDOSCOPY;  Service: Gastroenterology;  Laterality: N/A;   EYE SURGERY     metatarsal osteotomy Left 03/18/2013   TONSILLECTOMY     TOTAL KNEE ARTHROPLASTY Left 12/31/2021   Procedure: LEFT TOTAL KNEE ARTHROPLASTY;  Surgeon: Mcarthur Rossetti, MD;  Location: [place];  Service: Orthopedics;  Laterality: Left;   TYMPANOSTOMY TUBE PLACEMENT     Patient Active Problem List   Diagnosis Date Noted   Unilateral primary osteoarthritis, left knee 12/31/2021   OA (osteoarthritis) of knee 12/31/2021   Status post total left knee replacement 12/31/2021   Ganglion cyst 10/19/2019   Cervical radiculitis 09/30/2019   Neck pain 09/30/2019   Primary osteoarthritis of both hands 09/21/2019   Swelling of right ring finger 09/21/2019   Nondisplaced fracture of proximal phalanx of left lesser toe(s), initial encounter for open fracture 01/03/2019   Obstructive sleep apnea syndrome 12/14/2017   Generalized weakness 07/28/2017   Snoring 07/28/2017   Chronic right hip pain 10/01/2016   Generalized osteoarthritis of hand 10/01/2016   Trigger ring finger of left hand 10/01/2016   Localized, primary osteoarthritis of the ankle and foot, left 05/15/2014   Chest pain on exertion 01/30/2014  Primary localized osteoarthrosis of ankle and foot 11/16/2013   Statin intolerance 09/03/2013   Hyperlipidemia, unspecified 09/03/2013   Cataract 12/09/2010   Other abnormal glucose 12/09/2010   Mitral valve disorders(424.0) 12/09/2010   Hereditary progressive muscular dystrophy ([place]) 12/09/2010   Benign essential hypertension 12/09/2010   Mitral valve prolapse 12/09/2010   Nonrheumatic mitral valve regurgitation 12/09/2010   Essential hypertension 02/10/2008   S/P cardiac catheterization 06/18/2006    PCP: Jodi Marble, MD  REFERRING PROVIDER: Mcarthur Rossetti, MD  REFERRING DIAG: S/p L total knee replacement  THERAPY DIAG:  Unilateral primary osteoarthritis, left knee  Status  post total left knee replacement  Joint stiffness of knee, left  Muscle weakness (generalized)  Gait difficulty  Rationale for Evaluation and Treatment Rehabilitation  ONSET DATE: 12/31/21  (surgery date)  SUBJECTIVE:   SUBJECTIVE STATEMENT:  01/23/22 Pt. Reports no new complaints.  Pt. Entered PT with use of SPC and consistent 2-point gait pattern.  Pts. Steristrips are still in place but loosen.  Good incision healing.    PERTINENT HISTORY: Pt. Reports h/o L shoulder/R knee arthritis.  Pt. Lives alone in 1 story house.  Pt. Independent with driving prior to TKA.  Pt. Enjoys quilting and wants to return to gym.    PAIN:  Are you having pain? Yes: NPRS scale: 5/10 Pain location: L knee Pain description: sore/ aching Aggravating factors: bending Relieving factors: rest  PRECAUTIONS: None  WEIGHT BEARING RESTRICTIONS No  FALLS:  Has patient fallen in last 6 months? No  LIVING ENVIRONMENT: Lives with: lives with their family and lives alone Lives in: House/apartment Stairs: Yes: External: 5 steps; on left going up Has following equipment at home: Single point cane  OCCUPATION: retired  PLOF: Independent  PATIENT GOALS Increase L knee ROM/ strengthening and return to gym/ aquatic ex.    OBJECTIVE:   DIAGNOSTIC FINDINGS: PORTABLE LEFT KNEE - 1-2 VIEW   COMPARISON:  Portable exam 1415 hours compared to 10/23/2021   FINDINGS: Components of LEFT knee prosthesis identified in expected positions.   No acute fracture, dislocation, or bone destruction.   IMPRESSION: LEFT knee prosthesis without acute complication.     Electronically Signed   By: Lavonia Dana M.D.   On: 12/31/2021 14:37  PATIENT SURVEYS:  FOTO initial 40/ goal 52  COGNITION:  Overall cognitive status: Within functional limits for tasks assessed     SENSATION: WFL  EDEMA:  Circumferential: L/R joint line (45.5/ 39.5 cm.), distal quad (48.5/42.5 cm), mid-calf (38/ 34.5 cm)  POSTURE: rounded  shoulders and forward head  PALPATION: Tenderness around L knee joint/ patella  LOWER EXTREMITY ROM:  Active ROM Right eval Left eval  Hip flexion [facility]  Hip extension    Hip abduction    Hip adduction    Hip internal rotation    Hip external rotation    Knee flexion 137 deg. 99 deg.  Knee extension 0 deg. -4 deg.  Ankle dorsiflexion    Ankle plantarflexion    Ankle inversion    Ankle eversion     (Blank rows = not tested)  LOWER EXTREMITY MMT:  B LE muscle strength grossly 4+/5 MMT except L knee ext./ flexion 4/5 MMT.    GAIT: Distance walked: in clinic Assistive device utilized: Single point cane Level of assistance: Modified independence Comments: Pt. Has been using SPC for a week.  2-point gait pattern.    TODAY'S TREATMENT:  01/23/22:  Nustep L4 10 min. Seat #8-7 with B UE/LE.  Discussed daily activity.   Walking in //-bars:  added high marching/ lateral walking/ hamstring curls/ partial squats heel raises 20x each.    Walking around PT clinic with and without SPC and consistent recip. Gait pattern (3 laps in hallway/ clinic).   No antalgic gait pattern.     Supine heel slides/ quad sets (manual feedback)/ SLR/ hip abduction 10x each with holds.    Recip. Ascending/ descending stairs 3x with 1 handrail/ SPC on R.    Supine L knee AROM:  0-116 deg.  Pt. Guarded with any AAROM or PROM.  Good patellar mobility.   3 steristrips removed.   Pt. Instructed to ice L knee at home.        PATIENT EDUCATION:  Education details: Reviewed HEP Person educated: Patient Education method: Customer service manager Education comprehension: verbalized understanding and returned demonstration  HOME EXERCISE PROGRAM: Standing hip ex./ walking.     ASSESSMENT:  CLINICAL IMPRESSION: Pt. Entered PT with consistent recip. Pattern/ heel strike and use of SPC on R for 2-point gait.  Pt. Reports 5/10 L knee pain and has increase L knee flexion 116 deg. In supine  position.  Good understanding of HEP and importance of daily walking/ icing to manage swelling/ pain.   Slight increase in L knee discomfort while descending stairs with reicp. Pattern/ eccentric muscle control.  Pt. Will benefit from skilled PT services to increase L knee ROM/ strengthening to improve pain-free mobility/ return to gym.     OBJECTIVE IMPAIRMENTS Abnormal gait, decreased activity tolerance, decreased balance, decreased endurance, decreased mobility, difficulty walking, decreased ROM, decreased strength, hypomobility, increased edema, impaired flexibility, and pain.   ACTIVITY LIMITATIONS carrying, lifting, standing, squatting, stairs, transfers, toileting, dressing, and locomotion level  PARTICIPATION LIMITATIONS: meal prep, cleaning, laundry, medication management, driving, shopping, and community activity  PERSONAL FACTORS Age, Fitness, and Past/current experiences are also affecting patient's functional outcome.   REHAB POTENTIAL: Good  CLINICAL DECISION MAKING: Stable/uncomplicated  EVALUATION COMPLEXITY: Low   GOALS: Goals reviewed with patient? Yes  SHORT TERM GOALS: Target date: 02/13/22 Pt. Independent with HEP to increase L knee AROM (0 to 115 deg.) to improve pain-free mobility.   Baseline:  -4 to 99 deg.  01/23/22: 0-116 deg.  Goal status: Met   LONG TERM GOALS: Target date: 03/13/22  Pt. Will increase FOTO to 52 to improve pain-free mobility.   Baseline: initial FOTO 40 Goal status: INITIAL  2.  Pt. Able to ascend/ descend stairs with recip. Pattern and no handrail safely.   Baseline:  Difficulty descending stairs (step to pattern).   Goal status: INITIAL  3.  Pt. Will ambulates community distances with recip pattern and no assistive device safely.   Baseline:  limited distances/ SPC use Goal status: INITIAL   PLAN: PT FREQUENCY: 2x/week  PT DURATION: 8 weeks  PLANNED INTERVENTIONS: Therapeutic exercises, Therapeutic activity, Neuromuscular  re-education, Balance training, Gait training, Patient/Family education, Self Care, Joint mobilization, Aquatic Therapy, Cryotherapy, scar mobilization, and Manual therapy  PLAN FOR NEXT SESSION: Progress L knee ROM  Pura Spice, PT, DPT # [phone] 01/26/2022, 9:25 AM

## 2022-01-27 ENCOUNTER — Encounter: Payer: Self-pay | Admitting: Physical Therapy

## 2022-01-27 ENCOUNTER — Ambulatory Visit: Payer: Medicare PPO | Admitting: Physical Therapy

## 2022-01-27 DIAGNOSIS — M25662 Stiffness of left knee, not elsewhere classified: Secondary | ICD-10-CM

## 2022-01-27 DIAGNOSIS — Z96652 Presence of left artificial knee joint: Secondary | ICD-10-CM

## 2022-01-27 DIAGNOSIS — M1712 Unilateral primary osteoarthritis, left knee: Secondary | ICD-10-CM | POA: Diagnosis not present

## 2022-01-27 DIAGNOSIS — M6281 Muscle weakness (generalized): Secondary | ICD-10-CM | POA: Diagnosis not present

## 2022-01-27 DIAGNOSIS — R269 Unspecified abnormalities of gait and mobility: Secondary | ICD-10-CM

## 2022-01-27 NOTE — Therapy (Signed)
OUTPATIENT PHYSICAL THERAPY LOWER EXTREMITY TREATMENT   Patient Name: Diana Sullivan MRN: [id number] DOB:29-Apr-1936, 86 y.o., female Today's Date: 01/27/2022   PT End of Session - 01/27/22 0952     Visit Number 4    Number of Visits 17    Date for PT Re-Evaluation 03/13/22    PT Start Time 0946    PT Stop Time 1028    PT Time Calculation (min) 42 min             Past Medical History:  Diagnosis Date   Anginal pain ([place])    chest pain occasionally, checked out [facility] (no Issues)   Arthritis    hands   Bronchitis    Complication of anesthesia    patient stated"They had trouble waking me up"   Elevated lipids    Gout    Headache    once every 2 or 3 weeks   History of recent fall    shoulder and knee pain, left side   Hypertension    essential hypertension-controlled on meds   Mitral valve disorder    Muscular dystrophy ([place])    Patient stated someone told her 25 years ago "that she had the gene for it" No issues   MVP (mitral valve prolapse)    Neuropathy    Pre-diabetes    watching diet   Wears partial dentures    upper and lower   Past Surgical History:  Procedure Laterality Date   ABDOMINAL HYSTERECTOMY     adenectomy     BACK SURGERY     BREAST SURGERY Right    biopsy, lumpectomy   BUNIONECTOMY Bilateral    CARDIAC CATHETERIZATION     no issue   CARPAL TUNNEL RELEASE Right    dr Fredna Dow   CATARACT EXTRACTION Left 12/06/2012   CATARACT EXTRACTION W/PHACO Right 11/14/2019   Procedure: CATARACT EXTRACTION PHACO AND INTRAOCULAR LENS PLACEMENT ([place]) RIGHT 4.76  00:44.4;  Surgeon: Eulogio Bear, MD;  Location: [place];  Service: Ophthalmology;  Laterality: Right;   COLONOSCOPY N/A 04/21/2016   Procedure: COLONOSCOPY;  Surgeon: Manya Silvas, MD;  Location: [facility] ENDOSCOPY;  Service: Endoscopy;  Laterality: N/A;  Diabetic   COLONOSCOPY N/A 06/05/2021   Procedure: COLONOSCOPY;  Surgeon: Toledo, Benay Pike, MD;   Location: [facility] ENDOSCOPY;  Service: Gastroenterology;  Laterality: N/A;   EYE SURGERY     metatarsal osteotomy Left 03/18/2013   TONSILLECTOMY     TOTAL KNEE ARTHROPLASTY Left 12/31/2021   Procedure: LEFT TOTAL KNEE ARTHROPLASTY;  Surgeon: Mcarthur Rossetti, MD;  Location: [place];  Service: Orthopedics;  Laterality: Left;   TYMPANOSTOMY TUBE PLACEMENT     Patient Active Problem List   Diagnosis Date Noted   Unilateral primary osteoarthritis, left knee 12/31/2021   OA (osteoarthritis) of knee 12/31/2021   Status post total left knee replacement 12/31/2021   Ganglion cyst 10/19/2019   Cervical radiculitis 09/30/2019   Neck pain 09/30/2019   Primary osteoarthritis of both hands 09/21/2019   Swelling of right ring finger 09/21/2019   Nondisplaced fracture of proximal phalanx of left lesser toe(s), initial encounter for open fracture 01/03/2019   Obstructive sleep apnea syndrome 12/14/2017   Generalized weakness 07/28/2017   Snoring 07/28/2017   Chronic right hip pain 10/01/2016   Generalized osteoarthritis of hand 10/01/2016   Trigger ring finger of left hand 10/01/2016   Localized, primary osteoarthritis of the ankle and foot, left 05/15/2014   Chest pain on exertion 01/30/2014  Primary localized osteoarthrosis of ankle and foot 11/16/2013   Statin intolerance 09/03/2013   Hyperlipidemia, unspecified 09/03/2013   Cataract 12/09/2010   Other abnormal glucose 12/09/2010   Mitral valve disorders(424.0) 12/09/2010   Hereditary progressive muscular dystrophy ([place]) 12/09/2010   Benign essential hypertension 12/09/2010   Mitral valve prolapse 12/09/2010   Nonrheumatic mitral valve regurgitation 12/09/2010   Essential hypertension 02/10/2008   S/P cardiac catheterization 06/18/2006    PCP: Jodi Marble, MD  REFERRING PROVIDER: Mcarthur Rossetti, MD  REFERRING DIAG: S/p L total knee replacement  THERAPY DIAG:  Unilateral primary osteoarthritis, left knee  Status  post total left knee replacement  Joint stiffness of knee, left  Muscle weakness (generalized)  Gait difficulty  Rationale for Evaluation and Treatment Rehabilitation  ONSET DATE: 12/31/21  (surgery date)  SUBJECTIVE:   SUBJECTIVE STATEMENT:  01/27/22 Pt. States she was hurting in L knee/ lower leg yesterday morning and this morning.  Pt. Reports no pain at this time.  Pt. Did not go to [facility] yesterday.  Pt. Will occasionally take Tylenol.  Pt. Returns to ortho hand MD on 9/6 and knee surgeon 9/13.    PERTINENT HISTORY: Pt. Reports h/o L shoulder/R knee arthritis.  Pt. Lives alone in 1 story house.  Pt. Independent with driving prior to TKA.  Pt. Enjoys quilting and wants to return to gym.    PAIN:  Are you having pain? Yes: NPRS scale: 5/10 Pain location: L knee Pain description: sore/ aching Aggravating factors: bending Relieving factors: rest  PRECAUTIONS: None  WEIGHT BEARING RESTRICTIONS No  FALLS:  Has patient fallen in last 6 months? No  LIVING ENVIRONMENT: Lives with: lives with their family and lives alone Lives in: House/apartment Stairs: Yes: External: 5 steps; on left going up Has following equipment at home: Single point cane  OCCUPATION: retired  PLOF: Independent  PATIENT GOALS Increase L knee ROM/ strengthening and return to gym/ aquatic ex.    OBJECTIVE:   DIAGNOSTIC FINDINGS: PORTABLE LEFT KNEE - 1-2 VIEW   COMPARISON:  Portable exam 1415 hours compared to 10/23/2021   FINDINGS: Components of LEFT knee prosthesis identified in expected positions.   No acute fracture, dislocation, or bone destruction.   IMPRESSION: LEFT knee prosthesis without acute complication.     Electronically Signed   By: Lavonia Dana M.D.   On: 12/31/2021 14:37  PATIENT SURVEYS:  FOTO initial 40/ goal 52  COGNITION:  Overall cognitive status: Within functional limits for tasks assessed     SENSATION: WFL  EDEMA:  Circumferential: L/R joint line (45.5/  39.5 cm.), distal quad (48.5/42.5 cm), mid-calf (38/ 34.5 cm)  POSTURE: rounded shoulders and forward head  PALPATION: Tenderness around L knee joint/ patella  LOWER EXTREMITY ROM:  Active ROM Right eval Left eval  Hip flexion [facility]  Hip extension    Hip abduction    Hip adduction    Hip internal rotation    Hip external rotation    Knee flexion 137 deg. 99 deg.  Knee extension 0 deg. -4 deg.  Ankle dorsiflexion    Ankle plantarflexion    Ankle inversion    Ankle eversion     (Blank rows = not tested)  LOWER EXTREMITY MMT:  B LE muscle strength grossly 4+/5 MMT except L knee ext./ flexion 4/5 MMT.    GAIT: Distance walked: in clinic Assistive device utilized: Single point cane Level of assistance: Modified independence Comments: Pt. Has been using SPC for a week.  2-point  gait pattern.    TODAY'S TREATMENT:  01/27/22:  Nustep L4 10 min.  Seat #9-8 with B UE/LE.  Discussed weekend activities.    3# ankle wts. With seated LAQ/ marching/ forwards/ backwards and lateral walking in //-bars 3 laps each.     Recip. Stairs with min. To no UE assist with focus on L quad eccentric muscle control 4x.  STS from gray chair 5x with no UE assist.    Supine quad sets with manual feedback 6x (full knee extension).  Supine L SLR/ heel slides 10x each.  (0- 118 deg. AROM on L).    Walking around PT clinic with consistent recip. Gait pattern/ BOS with no SPC and no increase c/o knee pain.    Discussed HEP    01/23/22:  Nustep L4 10 min. Seat #8-7 with B UE/LE.  Discussed daily activity.   Walking in //-bars:  added high marching/ lateral walking/ hamstring curls/ partial squats heel raises 20x each.    Walking around PT clinic with and without SPC and consistent recip. Gait pattern (3 laps in hallway/ clinic).   No antalgic gait pattern.     Supine heel slides/ quad sets (manual feedback)/ SLR/ hip abduction 10x each with holds.    Recip. Ascending/ descending stairs 3x  with 1 handrail/ SPC on R.    Supine L knee AROM:  0-116 deg.  Pt. Guarded with any AAROM or PROM.  Good patellar mobility.   3 steristrips removed.   Pt. Instructed to ice L knee at home.        PATIENT EDUCATION:  Education details: Reviewed HEP Person educated: Patient Education method: Customer service manager Education comprehension: verbalized understanding and returned demonstration  HOME EXERCISE PROGRAM: Standing hip ex./ walking.     ASSESSMENT:  CLINICAL IMPRESSION: Pt. Presents with consistent improvement in L knee AROM: 0 to 116 deg. In supine position.  Good understanding of HEP and importance of daily walking/ icing to manage swelling/ pain.   Good L knee/ quad muscle control while descending stairs without UE assist. No change to HEP.   Pt. Will benefit from skilled PT services to increase L knee ROM/ strengthening to improve pain-free mobility/ return to gym.     OBJECTIVE IMPAIRMENTS Abnormal gait, decreased activity tolerance, decreased balance, decreased endurance, decreased mobility, difficulty walking, decreased ROM, decreased strength, hypomobility, increased edema, impaired flexibility, and pain.   ACTIVITY LIMITATIONS carrying, lifting, standing, squatting, stairs, transfers, toileting, dressing, and locomotion level  PARTICIPATION LIMITATIONS: meal prep, cleaning, laundry, medication management, driving, shopping, and community activity  PERSONAL FACTORS Age, Fitness, and Past/current experiences are also affecting patient's functional outcome.   REHAB POTENTIAL: Good  CLINICAL DECISION MAKING: Stable/uncomplicated  EVALUATION COMPLEXITY: Low   GOALS: Goals reviewed with patient? Yes  SHORT TERM GOALS: Target date: 02/13/22 Pt. Independent with HEP to increase L knee AROM (0 to 115 deg.) to improve pain-free mobility.   Baseline:  -4 to 99 deg.  01/23/22: 0-116 deg.  Goal status: Met   LONG TERM GOALS: Target date: 03/13/22  Pt. Will  increase FOTO to 52 to improve pain-free mobility.   Baseline: initial FOTO 40 Goal status: INITIAL  2.  Pt. Able to ascend/ descend stairs with recip. Pattern and no handrail safely.   Baseline:  Difficulty descending stairs (step to pattern).   Goal status: INITIAL  3.  Pt. Will ambulates community distances with recip pattern and no assistive device safely.   Baseline:  limited distances/ SPC use Goal  status: INITIAL   PLAN: PT FREQUENCY: 2x/week  PT DURATION: 8 weeks  PLANNED INTERVENTIONS: Therapeutic exercises, Therapeutic activity, Neuromuscular re-education, Balance training, Gait training, Patient/Family education, Self Care, Joint mobilization, Aquatic Therapy, Cryotherapy, scar mobilization, and Manual therapy  PLAN FOR NEXT SESSION: Progress L knee ROM  Pura Spice, PT, DPT # [phone] 01/27/2022, 10:39 AM

## 2022-01-28 ENCOUNTER — Encounter: Payer: Medicare PPO | Admitting: Physical Therapy

## 2022-01-28 DIAGNOSIS — F411 Generalized anxiety disorder: Secondary | ICD-10-CM | POA: Diagnosis not present

## 2022-01-28 DIAGNOSIS — E785 Hyperlipidemia, unspecified: Secondary | ICD-10-CM | POA: Diagnosis not present

## 2022-01-28 DIAGNOSIS — M5430 Sciatica, unspecified side: Secondary | ICD-10-CM | POA: Diagnosis not present

## 2022-01-28 DIAGNOSIS — M13862 Other specified arthritis, left knee: Secondary | ICD-10-CM | POA: Diagnosis not present

## 2022-01-28 DIAGNOSIS — K219 Gastro-esophageal reflux disease without esophagitis: Secondary | ICD-10-CM | POA: Diagnosis not present

## 2022-01-28 DIAGNOSIS — M5412 Radiculopathy, cervical region: Secondary | ICD-10-CM | POA: Diagnosis not present

## 2022-01-28 DIAGNOSIS — I10 Essential (primary) hypertension: Secondary | ICD-10-CM | POA: Diagnosis not present

## 2022-01-28 DIAGNOSIS — J301 Allergic rhinitis due to pollen: Secondary | ICD-10-CM | POA: Diagnosis not present

## 2022-01-28 DIAGNOSIS — E559 Vitamin D deficiency, unspecified: Secondary | ICD-10-CM | POA: Diagnosis not present

## 2022-01-29 ENCOUNTER — Encounter: Payer: Self-pay | Admitting: Physical Therapy

## 2022-01-29 ENCOUNTER — Ambulatory Visit: Payer: Medicare PPO | Admitting: Physical Therapy

## 2022-01-29 DIAGNOSIS — R269 Unspecified abnormalities of gait and mobility: Secondary | ICD-10-CM

## 2022-01-29 DIAGNOSIS — M25662 Stiffness of left knee, not elsewhere classified: Secondary | ICD-10-CM | POA: Diagnosis not present

## 2022-01-29 DIAGNOSIS — M6281 Muscle weakness (generalized): Secondary | ICD-10-CM

## 2022-01-29 DIAGNOSIS — Z96652 Presence of left artificial knee joint: Secondary | ICD-10-CM

## 2022-01-29 DIAGNOSIS — M1712 Unilateral primary osteoarthritis, left knee: Secondary | ICD-10-CM | POA: Diagnosis not present

## 2022-01-29 NOTE — Therapy (Addendum)
OUTPATIENT PHYSICAL THERAPY LOWER EXTREMITY TREATMENT   Patient Name: Diana Sullivan MRN: [id number] DOB:Mar 18, 1936, 86 y.o., female Today's Date: 01/29/2022   PT End of Session - 01/29/22 0942     Visit Number 5    Number of Visits 17    Date for PT Re-Evaluation 03/13/22    PT Start Time 0942    PT Stop Time 1033    PT Time Calculation (min) 51 min             Past Medical History:  Diagnosis Date   Anginal pain ([place])    chest pain occasionally, checked out [facility] (no Issues)   Arthritis    hands   Bronchitis    Complication of anesthesia    patient stated"They had trouble waking me up"   Elevated lipids    Gout    Headache    once every 2 or 3 weeks   History of recent fall    shoulder and knee pain, left side   Hypertension    essential hypertension-controlled on meds   Mitral valve disorder    Muscular dystrophy ([place])    Patient stated someone told her 25 years ago "that she had the gene for it" No issues   MVP (mitral valve prolapse)    Neuropathy    Pre-diabetes    watching diet   Wears partial dentures    upper and lower   Past Surgical History:  Procedure Laterality Date   ABDOMINAL HYSTERECTOMY     adenectomy     BACK SURGERY     BREAST SURGERY Right    biopsy, lumpectomy   BUNIONECTOMY Bilateral    CARDIAC CATHETERIZATION     no issue   CARPAL TUNNEL RELEASE Right    dr Fredna Dow   CATARACT EXTRACTION Left 12/06/2012   CATARACT EXTRACTION W/PHACO Right 11/14/2019   Procedure: CATARACT EXTRACTION PHACO AND INTRAOCULAR LENS PLACEMENT ([place]) RIGHT 4.76  00:44.4;  Surgeon: Eulogio Bear, MD;  Location: [place];  Service: Ophthalmology;  Laterality: Right;   COLONOSCOPY N/A 04/21/2016   Procedure: COLONOSCOPY;  Surgeon: Manya Silvas, MD;  Location[facility] ENDOSCOPY;  Service: Endoscopy;  Laterality: N/A;  Diabetic   COLONOSCOPY N/A 06/05/2021   Procedure: COLONOSCOPY;  Surgeon: Toledo, Benay Pike, MD;   Location: [facility] ENDOSCOPY;  Service: Gastroenterology;  Laterality: N/A;   EYE SURGERY     metatarsal osteotomy Left 03/18/2013   TONSILLECTOMY     TOTAL KNEE ARTHROPLASTY Left 12/31/2021   Procedure: LEFT TOTAL KNEE ARTHROPLASTY;  Surgeon: Mcarthur Rossetti, MD;  Location: [place];  Service: Orthopedics;  Laterality: Left;   TYMPANOSTOMY TUBE PLACEMENT     Patient Active Problem List   Diagnosis Date Noted   Unilateral primary osteoarthritis, left knee 12/31/2021   OA (osteoarthritis) of knee 12/31/2021   Status post total left knee replacement 12/31/2021   Ganglion cyst 10/19/2019   Cervical radiculitis 09/30/2019   Neck pain 09/30/2019   Primary osteoarthritis of both hands 09/21/2019   Swelling of right ring finger 09/21/2019   Nondisplaced fracture of proximal phalanx of left lesser toe(s), initial encounter for open fracture 01/03/2019   Obstructive sleep apnea syndrome 12/14/2017   Generalized weakness 07/28/2017   Snoring 07/28/2017   Chronic right hip pain 10/01/2016   Generalized osteoarthritis of hand 10/01/2016   Trigger ring finger of left hand 10/01/2016   Localized, primary osteoarthritis of the ankle and foot, left 05/15/2014   Chest pain on exertion 01/30/2014  Primary localized osteoarthrosis of ankle and foot 11/16/2013   Statin intolerance 09/03/2013   Hyperlipidemia, unspecified 09/03/2013   Cataract 12/09/2010   Other abnormal glucose 12/09/2010   Mitral valve disorders(424.0) 12/09/2010   Hereditary progressive muscular dystrophy ([place]) 12/09/2010   Benign essential hypertension 12/09/2010   Mitral valve prolapse 12/09/2010   Nonrheumatic mitral valve regurgitation 12/09/2010   Essential hypertension 02/10/2008   S/P cardiac catheterization 06/18/2006    PCP: Jodi Marble, MD  REFERRING PROVIDER: Mcarthur Rossetti, MD  REFERRING DIAG: S/p L total knee replacement  THERAPY DIAG:  Unilateral primary osteoarthritis, left knee  Status  post total left knee replacement  Joint stiffness of knee, left  Muscle weakness (generalized)  Gait difficulty  Rationale for Evaluation and Treatment Rehabilitation  ONSET DATE: 12/31/21  (surgery date)  SUBJECTIVE:   SUBJECTIVE STATEMENT:  01/29/22 Pt. Reports no knee pain or issues prior to tx.  Pt. Returns to ortho hand MD on 9/6 and knee surgeon 9/13.    PERTINENT HISTORY: Pt. Reports h/o L shoulder/R knee arthritis.  Pt. Lives alone in 1 story house.  Pt. Independent with driving prior to TKA.  Pt. Enjoys quilting and wants to return to gym.    PAIN:  Are you having pain? Yes: NPRS scale: 0/10 Pain location: L knee Pain description: sore/ aching Aggravating factors: bending Relieving factors: rest  PRECAUTIONS: None  WEIGHT BEARING RESTRICTIONS No  FALLS:  Has patient fallen in last 6 months? No  LIVING ENVIRONMENT: Lives with: lives with their family and lives alone Lives in: House/apartment Stairs: Yes: External: 5 steps; on left going up Has following equipment at home: Single point cane  OCCUPATION: retired  PLOF: Independent  PATIENT GOALS Increase L knee ROM/ strengthening and return to gym/ aquatic ex.    OBJECTIVE:   DIAGNOSTIC FINDINGS: PORTABLE LEFT KNEE - 1-2 VIEW   COMPARISON:  Portable exam 1415 hours compared to 10/23/2021   FINDINGS: Components of LEFT knee prosthesis identified in expected positions.   No acute fracture, dislocation, or bone destruction.   IMPRESSION: LEFT knee prosthesis without acute complication.     Electronically Signed   By: Lavonia Dana M.D.   On: 12/31/2021 14:37  PATIENT SURVEYS:  FOTO initial 40/ goal 52  COGNITION:  Overall cognitive status: Within functional limits for tasks assessed     SENSATION: WFL  EDEMA:  Circumferential: L/R joint line (45.5/ 39.5 cm.), distal quad (48.5/42.5 cm), mid-calf (38/ 34.5 cm)  POSTURE: rounded shoulders and forward head  PALPATION: Tenderness around L  knee joint/ patella  LOWER EXTREMITY ROM:  Active ROM Right eval Left eval  Hip flexion [facility]  Hip extension    Hip abduction    Hip adduction    Hip internal rotation    Hip external rotation    Knee flexion 137 deg. 99 deg.  Knee extension 0 deg. -4 deg.  Ankle dorsiflexion    Ankle plantarflexion    Ankle inversion    Ankle eversion     (Blank rows = not tested)  LOWER EXTREMITY MMT:  B LE muscle strength grossly 4+/5 MMT except L knee ext./ flexion 4/5 MMT.    GAIT: Distance walked: in clinic Assistive device utilized: Single point cane Level of assistance: Modified independence Comments: Pt. Has been using SPC for a week.  2-point gait pattern.    TODAY'S TREATMENT:  01/29/22:  Nustep L5-4 10 min. Seat #9-8 with B UE/LE.  Fatigue in B LE reported at L5.  STS from gray chair 10x with mirror feedback (no UE assist).    4# ankle wts.: seated LAQ/ marching/ standing marching/ hip abduction/ hamstring curls/ heel raises 20x each.  Walking in //-bars with consistent step length/ upright posture with no UE assist.      Recip. Ascending/ descending stairs 5x with no handrail.  Walking around PT clinic without SPC and consistent recip. No antalgic gait pattern.  PT discussed discharging use of SPC at home and maybe use when walking into [place]/ outside.  Good balance noted.    Supine heel slides/ quad sets (manual feedback)/ SLR 10x each with holds.    Supine L knee AROM (full knee extension)- patellar mobs (all planes).  Supine/ seated L knee incision scar massage (excellent healing).    PT issued scar massage handout for pt. At home.  Discussed various scar massage creams.       01/27/22:  Nustep L4 10 min.  Seat #9-8 with B UE/LE.  Discussed weekend activities.    3# ankle wts. With seated LAQ/ marching/ forwards/ backwards and lateral walking in //-bars 3 laps each.     Recip. Stairs with min. To no UE assist with focus on L quad eccentric muscle control  4x.  STS from gray chair 5x with no UE assist.    Supine quad sets with manual feedback 6x (full knee extension).  Supine L SLR/ heel slides 10x each.  (0- 118 deg. AROM on L).    Walking around PT clinic with consistent recip. Gait pattern/ BOS with no SPC and no increase c/o knee pain.    Discussed HEP      PATIENT EDUCATION:  Education details: Reviewed HEP Person educated: Patient Education method: Customer service manager Education comprehension: verbalized understanding and returned demonstration  HOME EXERCISE PROGRAM: Standing hip ex./ walking.     ASSESSMENT:  CLINICAL IMPRESSION: Pt. Presents with consistent improvement in L knee AROM and excellent healing of incision.  Good understanding of HEP and importance of daily walking.  Pt. Will start to use ankle wts. At home with HEP.  Good L knee/ quad muscle control while descending stairs without UE assist. Pt. Ambulates with normalized gait patten with PT clinic with consistent step pattern/ heel strike.  Pt. Will benefit from skilled PT services to increase L knee ROM/ strengthening to improve pain-free mobility/ return to gym.     OBJECTIVE IMPAIRMENTS Abnormal gait, decreased activity tolerance, decreased balance, decreased endurance, decreased mobility, difficulty walking, decreased ROM, decreased strength, hypomobility, increased edema, impaired flexibility, and pain.   ACTIVITY LIMITATIONS carrying, lifting, standing, squatting, stairs, transfers, toileting, dressing, and locomotion level  PARTICIPATION LIMITATIONS: meal prep, cleaning, laundry, medication management, driving, shopping, and community activity  PERSONAL FACTORS Age, Fitness, and Past/current experiences are also affecting patient's functional outcome.   REHAB POTENTIAL: Good  CLINICAL DECISION MAKING: Stable/uncomplicated  EVALUATION COMPLEXITY: Low   GOALS: Goals reviewed with patient? Yes  SHORT TERM GOALS: Target date: 02/13/22 Pt.  Independent with HEP to increase L knee AROM (0 to 115 deg.) to improve pain-free mobility.   Baseline:  -4 to 99 deg.  01/23/22: 0-116 deg.  Goal status: Met   LONG TERM GOALS: Target date: 03/13/22  Pt. Will increase FOTO to 52 to improve pain-free mobility.   Baseline: initial FOTO 40 Goal status: INITIAL  2.  Pt. Able to ascend/ descend stairs with recip. Pattern and no handrail safely.   Baseline:  Difficulty descending stairs (step to pattern).   Goal status:  INITIAL  3.  Pt. Will ambulates community distances with recip pattern and no assistive device safely.   Baseline:  limited distances/ SPC use Goal status: INITIAL   PLAN: PT FREQUENCY: 2x/week  PT DURATION: 8 weeks  PLANNED INTERVENTIONS: Therapeutic exercises, Therapeutic activity, Neuromuscular re-education, Balance training, Gait training, Patient/Family education, Self Care, Joint mobilization, Aquatic Therapy, Cryotherapy, scar mobilization, and Manual therapy  PLAN FOR NEXT SESSION: Progress L knee ROM.  Discuss return to gym.    Pura Spice, PT, DPT # [phone] 01/29/2022, 10:58 AM

## 2022-01-30 ENCOUNTER — Encounter: Payer: Medicare PPO | Admitting: Physical Therapy

## 2022-02-03 ENCOUNTER — Ambulatory Visit: Payer: Medicare PPO | Admitting: Physical Therapy

## 2022-02-03 ENCOUNTER — Encounter: Payer: Self-pay | Admitting: Physical Therapy

## 2022-02-03 DIAGNOSIS — M1712 Unilateral primary osteoarthritis, left knee: Secondary | ICD-10-CM | POA: Diagnosis not present

## 2022-02-03 DIAGNOSIS — M25662 Stiffness of left knee, not elsewhere classified: Secondary | ICD-10-CM

## 2022-02-03 DIAGNOSIS — M6281 Muscle weakness (generalized): Secondary | ICD-10-CM

## 2022-02-03 DIAGNOSIS — R269 Unspecified abnormalities of gait and mobility: Secondary | ICD-10-CM

## 2022-02-03 DIAGNOSIS — Z96652 Presence of left artificial knee joint: Secondary | ICD-10-CM | POA: Diagnosis not present

## 2022-02-03 NOTE — Therapy (Signed)
OUTPATIENT PHYSICAL THERAPY LOWER EXTREMITY TREATMENT   Patient Name: Diana Sullivan MRN: [id number] DOB:06-10-1935, 86 y.o., female Today's Date: 02/03/2022   PT End of Session - 02/03/22 0934     Visit Number 6    Number of Visits 17    Date for PT Re-Evaluation 03/13/22    PT Start Time 0934    PT Stop Time 79    PT Time Calculation (min) 45 min             Past Medical History:  Diagnosis Date   Anginal pain ([place])    chest pain occasionally, checked out [facility] (no Issues)   Arthritis    hands   Bronchitis    Complication of anesthesia    patient stated"They had trouble waking me up"   Elevated lipids    Gout    Headache    once every 2 or 3 weeks   History of recent fall    shoulder and knee pain, left side   Hypertension    essential hypertension-controlled on meds   Mitral valve disorder    Muscular dystrophy ([place])    Patient stated someone told her 25 years ago "that she had the gene for it" No issues   MVP (mitral valve prolapse)    Neuropathy    Pre-diabetes    watching diet   Wears partial dentures    upper and lower   Past Surgical History:  Procedure Laterality Date   ABDOMINAL HYSTERECTOMY     adenectomy     BACK SURGERY     BREAST SURGERY Right    biopsy, lumpectomy   BUNIONECTOMY Bilateral    CARDIAC CATHETERIZATION     no issue   CARPAL TUNNEL RELEASE Right    dr Fredna Dow   CATARACT EXTRACTION Left 12/06/2012   CATARACT EXTRACTION W/PHACO Right 11/14/2019   Procedure: CATARACT EXTRACTION PHACO AND INTRAOCULAR LENS PLACEMENT ([place]) RIGHT 4.76  00:44.4;  Surgeon: Eulogio Bear, MD;  Location: [place];  Service: Ophthalmology;  Laterality: Right;   COLONOSCOPY N/A 04/21/2016   Procedure: COLONOSCOPY;  Surgeon: Manya Silvas, MD;  Location: [facility] ENDOSCOPY;  Service: Endoscopy;  Laterality: N/A;  Diabetic   COLONOSCOPY N/A 06/05/2021   Procedure: COLONOSCOPY;  Surgeon: Toledo, Benay Pike, MD;   Location: [facility] ENDOSCOPY;  Service: Gastroenterology;  Laterality: N/A;   EYE SURGERY     metatarsal osteotomy Left 03/18/2013   TONSILLECTOMY     TOTAL KNEE ARTHROPLASTY Left 12/31/2021   Procedure: LEFT TOTAL KNEE ARTHROPLASTY;  Surgeon: Mcarthur Rossetti, MD;  Location: [place];  Service: Orthopedics;  Laterality: Left;   TYMPANOSTOMY TUBE PLACEMENT     Patient Active Problem List   Diagnosis Date Noted   Unilateral primary osteoarthritis, left knee 12/31/2021   OA (osteoarthritis) of knee 12/31/2021   Status post total left knee replacement 12/31/2021   Ganglion cyst 10/19/2019   Cervical radiculitis 09/30/2019   Neck pain 09/30/2019   Primary osteoarthritis of both hands 09/21/2019   Swelling of right ring finger 09/21/2019   Nondisplaced fracture of proximal phalanx of left lesser toe(s), initial encounter for open fracture 01/03/2019   Obstructive sleep apnea syndrome 12/14/2017   Generalized weakness 07/28/2017   Snoring 07/28/2017   Chronic right hip pain 10/01/2016   Generalized osteoarthritis of hand 10/01/2016   Trigger ring finger of left hand 10/01/2016   Localized, primary osteoarthritis of the ankle and foot, left 05/15/2014   Chest pain on exertion 01/30/2014  Primary localized osteoarthrosis of ankle and foot 11/16/2013   Statin intolerance 09/03/2013   Hyperlipidemia, unspecified 09/03/2013   Cataract 12/09/2010   Other abnormal glucose 12/09/2010   Mitral valve disorders(424.0) 12/09/2010   Hereditary progressive muscular dystrophy ([place]) 12/09/2010   Benign essential hypertension 12/09/2010   Mitral valve prolapse 12/09/2010   Nonrheumatic mitral valve regurgitation 12/09/2010   Essential hypertension 02/10/2008   S/P cardiac catheterization 06/18/2006    PCP: Jodi Marble, MD  REFERRING PROVIDER: Mcarthur Rossetti, MD  REFERRING DIAG: S/p L total knee replacement  THERAPY DIAG:  Unilateral primary osteoarthritis, left knee  Status  post total left knee replacement  Joint stiffness of knee, left  Muscle weakness (generalized)  Gait difficulty  Rationale for Evaluation and Treatment Rehabilitation  ONSET DATE: 12/31/21  (surgery date)  SUBJECTIVE:   SUBJECTIVE STATEMENT:  02/03/22 Pt. Reports minimal (1/10) L medial knee pain prior to tx.  Pt. Returns to ortho hand MD on 9/6 and knee surgeon 9/13.  Excellent incision healing noted and pt. Using scar cream at home.    PERTINENT HISTORY: Pt. Reports h/o L shoulder/R knee arthritis.  Pt. Lives alone in 1 story house.  Pt. Independent with driving prior to TKA.  Pt. Enjoys quilting and wants to return to gym.    PAIN:  Are you having pain? Yes: NPRS scale: 0/10 Pain location: L knee Pain description: sore/ aching Aggravating factors: bending Relieving factors: rest  PRECAUTIONS: None  WEIGHT BEARING RESTRICTIONS No  FALLS:  Has patient fallen in last 6 months? No  LIVING ENVIRONMENT: Lives with: lives with their family and lives alone Lives in: House/apartment Stairs: Yes: External: 5 steps; on left going up Has following equipment at home: Single point cane  OCCUPATION: retired  PLOF: Independent  PATIENT GOALS Increase L knee ROM/ strengthening and return to gym/ aquatic ex.    OBJECTIVE:   DIAGNOSTIC FINDINGS: PORTABLE LEFT KNEE - 1-2 VIEW   COMPARISON:  Portable exam 1415 hours compared to 10/23/2021   FINDINGS: Components of LEFT knee prosthesis identified in expected positions.   No acute fracture, dislocation, or bone destruction.   IMPRESSION: LEFT knee prosthesis without acute complication.     Electronically Signed   By: Lavonia Dana M.D.   On: 12/31/2021 14:37  PATIENT SURVEYS:  FOTO initial 40/ goal 52  COGNITION:  Overall cognitive status: Within functional limits for tasks assessed     SENSATION: WFL  EDEMA:  Circumferential: L/R joint line (45.5/ 39.5 cm.), distal quad (48.5/42.5 cm), mid-calf (38/ 34.5  cm)  POSTURE: rounded shoulders and forward head  PALPATION: Tenderness around L knee joint/ patella  LOWER EXTREMITY ROM:  Active ROM Right eval Left eval  Hip flexion [facility]  Hip extension    Hip abduction    Hip adduction    Hip internal rotation    Hip external rotation    Knee flexion 137 deg. 99 deg.  Knee extension 0 deg. -4 deg.  Ankle dorsiflexion    Ankle plantarflexion    Ankle inversion    Ankle eversion     (Blank rows = not tested)  LOWER EXTREMITY MMT:  B LE muscle strength grossly 4+/5 MMT except L knee ext./ flexion 4/5 MMT.    GAIT: Distance walked: in clinic Assistive device utilized: Single point cane Level of assistance: Modified independence Comments: Pt. Has been using SPC for a week.  2-point gait pattern.    TODAY'S TREATMENT:     02/03/22:  Nustep L5 10 min. Seat #9-8 with B UE/LE.  No fatigue or pain reported today.          Walking in hallway/ ascending and descending stairs with recip. Gait pattern and no UE assist.      5# ankle wts.: seated LAQ/ marching/ standing marching/ hip abduction/ hamstring curls/ heel raises 20x each.  Walking in gym with consistent step length/ upright posture with no UE assist.  Pt. Will start using ankle wts. With HEP.      Supine knee to chest/ SLR/ bridging with while ball on blue mat table 10x each.      Supine L knee flexion AROM: 124 deg. Discussed scar massage to proximal aspect of incision.      01/29/22:  Nustep L5-4 10 min. Seat #9-8 with B UE/LE.  Fatigue in B LE reported at L5.     STS from gray chair 10x with mirror feedback (no UE assist).    4# ankle wts.: seated LAQ/ marching/ standing marching/ hip abduction/ hamstring curls/ heel raises 20x each.  Walking in //-bars with consistent step length/ upright posture with no UE assist.      Recip. Ascending/ descending stairs 5x with no handrail.  Walking around PT clinic without SPC and consistent recip. No antalgic gait pattern.  PT  discussed discharging use of SPC at home and maybe use when walking into [place]/ outside.  Good balance noted.    Supine heel slides/ quad sets (manual feedback)/ SLR 10x each with holds.    Supine L knee AROM (full knee extension)- patellar mobs (all planes).  Supine/ seated L knee incision scar massage (excellent healing).    PT issued scar massage handout for pt. At home.  Discussed various scar massage creams.       01/27/22:  Nustep L4 10 min.  Seat #9-8 with B UE/LE.  Discussed weekend activities.    3# ankle wts. With seated LAQ/ marching/ forwards/ backwards and lateral walking in //-bars 3 laps each.     Recip. Stairs with min. To no UE assist with focus on L quad eccentric muscle control 4x.  STS from gray chair 5x with no UE assist.    Supine quad sets with manual feedback 6x (full knee extension).  Supine L SLR/ heel slides 10x each.  (0- 118 deg. AROM on L).    Walking around PT clinic with consistent recip. Gait pattern/ BOS with no SPC and no increase c/o knee pain.    Discussed HEP      PATIENT EDUCATION:  Education details: Reviewed HEP Person educated: Patient Education method: Customer service manager Education comprehension: verbalized understanding and returned demonstration  HOME EXERCISE PROGRAM: Standing hip ex./ walking.     ASSESSMENT:  CLINICAL IMPRESSION: Pt. Presents with consistent improvement in L knee AROM (124 deg. Flexion with no pain in supine position) and excellent healing of incision.  Good understanding of HEP and importance of daily walking.  Pt. Will start to use ankle wts. At home with HEP.  Good L knee/ quad muscle control while descending stairs without UE assist. Pt. Ambulates with normalized gait patten with PT clinic with consistent step pattern/ heel strike.  Pt. Will benefit from skilled PT services to increase L knee ROM/ strengthening to improve pain-free mobility/ return to gym.     OBJECTIVE IMPAIRMENTS Abnormal gait,  decreased activity tolerance, decreased balance, decreased endurance, decreased mobility, difficulty walking, decreased ROM, decreased strength, hypomobility, increased edema, impaired flexibility, and pain.  ACTIVITY LIMITATIONS carrying, lifting, standing, squatting, stairs, transfers, toileting, dressing, and locomotion level  PARTICIPATION LIMITATIONS: meal prep, cleaning, laundry, medication management, driving, shopping, and community activity  PERSONAL FACTORS Age, Fitness, and Past/current experiences are also affecting patient's functional outcome.   REHAB POTENTIAL: Good  CLINICAL DECISION MAKING: Stable/uncomplicated  EVALUATION COMPLEXITY: Low   GOALS: Goals reviewed with patient? Yes  SHORT TERM GOALS: Target date: 02/13/22 Pt. Independent with HEP to increase L knee AROM (0 to 115 deg.) to improve pain-free mobility.   Baseline:  -4 to 99 deg.  01/23/22: 0-116 deg.  Goal status: Met   LONG TERM GOALS: Target date: 03/13/22  Pt. Will increase FOTO to 52 to improve pain-free mobility.   Baseline: initial FOTO 40 Goal status: INITIAL  2.  Pt. Able to ascend/ descend stairs with recip. Pattern and no handrail safely.   Baseline:  Difficulty descending stairs (step to pattern).   Goal status: INITIAL  3.  Pt. Will ambulates community distances with recip pattern and no assistive device safely.   Baseline:  limited distances/ SPC use Goal status: INITIAL   PLAN: PT FREQUENCY: 2x/week  PT DURATION: 8 weeks  PLANNED INTERVENTIONS: Therapeutic exercises, Therapeutic activity, Neuromuscular re-education, Balance training, Gait training, Patient/Family education, Self Care, Joint mobilization, Aquatic Therapy, Cryotherapy, scar mobilization, and Manual therapy  PLAN FOR NEXT SESSION: Progress L knee ROM.  Discuss return to gym.    Pura Spice, PT, DPT # [phone] 02/03/2022, 6:33 PM

## 2022-02-04 ENCOUNTER — Encounter: Payer: Medicare PPO | Admitting: Physical Therapy

## 2022-02-05 ENCOUNTER — Ambulatory Visit: Payer: Medicare PPO | Admitting: Physical Therapy

## 2022-02-05 ENCOUNTER — Encounter: Payer: Self-pay | Admitting: Physical Therapy

## 2022-02-05 DIAGNOSIS — M6281 Muscle weakness (generalized): Secondary | ICD-10-CM | POA: Diagnosis not present

## 2022-02-05 DIAGNOSIS — R269 Unspecified abnormalities of gait and mobility: Secondary | ICD-10-CM

## 2022-02-05 DIAGNOSIS — Z96652 Presence of left artificial knee joint: Secondary | ICD-10-CM | POA: Diagnosis not present

## 2022-02-05 DIAGNOSIS — M25662 Stiffness of left knee, not elsewhere classified: Secondary | ICD-10-CM

## 2022-02-05 DIAGNOSIS — M1712 Unilateral primary osteoarthritis, left knee: Secondary | ICD-10-CM | POA: Diagnosis not present

## 2022-02-05 NOTE — Therapy (Signed)
OUTPATIENT PHYSICAL THERAPY LOWER EXTREMITY TREATMENT   Patient Name: Diana Sullivan MRN: [id number] DOB:[birth year], 86 y.o., female Today's Date: 02/05/2022   PT End of Session - 02/05/22 0927     Visit Number 7    Number of Visits 17    Date for PT Re-Evaluation 03/13/22    PT Start Time 0939    PT Stop Time 1028    PT Time Calculation (min) 49 min             Past Medical History:  Diagnosis Date   Anginal pain ([place])    chest pain occasionally, checked out [facility] (no Issues)   Arthritis    hands   Bronchitis    Complication of anesthesia    patient stated"They had trouble waking me up"   Elevated lipids    Gout    Headache    once every 2 or 3 weeks   History of recent fall    shoulder and knee pain, left side   Hypertension    essential hypertension-controlled on meds   Mitral valve disorder    Muscular dystrophy ([place])    Patient stated someone told her 25 years ago "that she had the gene for it" No issues   MVP (mitral valve prolapse)    Neuropathy    Pre-diabetes    watching diet   Wears partial dentures    upper and lower   Past Surgical History:  Procedure Laterality Date   ABDOMINAL HYSTERECTOMY     adenectomy     BACK SURGERY     BREAST SURGERY Right    biopsy, lumpectomy   BUNIONECTOMY Bilateral    CARDIAC CATHETERIZATION     no issue   CARPAL TUNNEL RELEASE Right    dr Fredna Dow   CATARACT EXTRACTION Left 12/06/2012   CATARACT EXTRACTION W/PHACO Right 11/14/2019   Procedure: CATARACT EXTRACTION PHACO AND INTRAOCULAR LENS PLACEMENT ([place]) RIGHT 4.76  00:44.4;  Surgeon: Eulogio Bear, MD;  Location: [place];  Service: Ophthalmology;  Laterality: Right;   COLONOSCOPY N/A 04/21/2016   Procedure: COLONOSCOPY;  Surgeon: Manya Silvas, MD;  Location: [facility] ENDOSCOPY;  Service: Endoscopy;  Laterality: N/A;  Diabetic   COLONOSCOPY N/A 06/05/2021   Procedure: COLONOSCOPY;  Surgeon: Toledo, Benay Pike, MD;   Location: [facility] ENDOSCOPY;  Service: Gastroenterology;  Laterality: N/A;   EYE SURGERY     metatarsal osteotomy Left 03/18/2013   TONSILLECTOMY     TOTAL KNEE ARTHROPLASTY Left 12/31/2021   Procedure: LEFT TOTAL KNEE ARTHROPLASTY;  Surgeon: Mcarthur Rossetti, MD;  Location: [place];  Service: Orthopedics;  Laterality: Left;   TYMPANOSTOMY TUBE PLACEMENT     Patient Active Problem List   Diagnosis Date Noted   Unilateral primary osteoarthritis, left knee 12/31/2021   OA (osteoarthritis) of knee 12/31/2021   Status post total left knee replacement 12/31/2021   Ganglion cyst 10/19/2019   Cervical radiculitis 09/30/2019   Neck pain 09/30/2019   Primary osteoarthritis of both hands 09/21/2019   Swelling of right ring finger 09/21/2019   Nondisplaced fracture of proximal phalanx of left lesser toe(s), initial encounter for open fracture 01/03/2019   Obstructive sleep apnea syndrome 12/14/2017   Generalized weakness 07/28/2017   Snoring 07/28/2017   Chronic right hip pain 10/01/2016   Generalized osteoarthritis of hand 10/01/2016   Trigger ring finger of left hand 10/01/2016   Localized, primary osteoarthritis of the ankle and foot, left 05/15/2014   Chest pain on exertion 01/30/2014  Primary localized osteoarthrosis of ankle and foot 11/16/2013   Statin intolerance 09/03/2013   Hyperlipidemia, unspecified 09/03/2013   Cataract 12/09/2010   Other abnormal glucose 12/09/2010   Mitral valve disorders(424.0) 12/09/2010   Hereditary progressive muscular dystrophy ([place]) 12/09/2010   Benign essential hypertension 12/09/2010   Mitral valve prolapse 12/09/2010   Nonrheumatic mitral valve regurgitation 12/09/2010   Essential hypertension 02/10/2008   S/P cardiac catheterization 06/18/2006    PCP: Jodi Marble, MD  REFERRING PROVIDER: Mcarthur Rossetti, MD  REFERRING DIAG: S/p L total knee replacement  THERAPY DIAG:  Unilateral primary osteoarthritis, left knee  Status  post total left knee replacement  Joint stiffness of knee, left  Muscle weakness (generalized)  Gait difficulty  Rationale for Evaluation and Treatment Rehabilitation  ONSET DATE: 12/31/21  (surgery date)  SUBJECTIVE:   SUBJECTIVE STATEMENT:  02/05/22 Pt. Reports minimal (1/10) L medial/ joint line knee pain prior to tx.  Pt. Returns to ortho hand MD on 9/6 and knee surgeon 9/13.    PERTINENT HISTORY: Pt. Reports h/o L shoulder/R knee arthritis.  Pt. Lives alone in 1 story house.  Pt. Independent with driving prior to TKA.  Pt. Enjoys quilting and wants to return to gym.    PAIN:  Are you having pain? Yes: NPRS scale: 0-1/10 Pain location: L knee Pain description: sore/ aching Aggravating factors: bending Relieving factors: rest  PRECAUTIONS: None  WEIGHT BEARING RESTRICTIONS No  FALLS:  Has patient fallen in last 6 months? No  LIVING ENVIRONMENT: Lives with: lives with their family and lives alone Lives in: House/apartment Stairs: Yes: External: 5 steps; on left going up Has following equipment at home: Single point cane  OCCUPATION: retired  PLOF: Independent  PATIENT GOALS Increase L knee ROM/ strengthening and return to gym/ aquatic ex.    OBJECTIVE:   DIAGNOSTIC FINDINGS: PORTABLE LEFT KNEE - 1-2 VIEW   COMPARISON:  Portable exam 1415 hours compared to 10/23/2021   FINDINGS: Components of LEFT knee prosthesis identified in expected positions.   No acute fracture, dislocation, or bone destruction.   IMPRESSION: LEFT knee prosthesis without acute complication.     Electronically Signed   By: Lavonia Dana M.D.   On: 12/31/2021 14:37  PATIENT SURVEYS:  FOTO initial 40/ goal 52  COGNITION:  Overall cognitive status: Within functional limits for tasks assessed     SENSATION: WFL  EDEMA:  Circumferential: L/R joint line (45.5/ 39.5 cm.), distal quad (48.5/42.5 cm), mid-calf (38/ 34.5 cm)  POSTURE: rounded shoulders and forward  head  PALPATION: Tenderness around L knee joint/ patella  LOWER EXTREMITY ROM:  Active ROM Right eval Left eval  Hip flexion [facility]  Hip extension    Hip abduction    Hip adduction    Hip internal rotation    Hip external rotation    Knee flexion 137 deg. 99 deg.  Knee extension 0 deg. -4 deg.  Ankle dorsiflexion    Ankle plantarflexion    Ankle inversion    Ankle eversion     (Blank rows = not tested)  LOWER EXTREMITY MMT:  B LE muscle strength grossly 4+/5 MMT except L knee ext./ flexion 4/5 MMT.    GAIT: Distance walked: in clinic Assistive device utilized: Single point cane Level of assistance: Modified independence Comments: Pt. Has been using SPC for a week.  2-point gait pattern.    TODAY'S TREATMENT:     02/05/22:     Nustep L5 10 min. Seat #9 with B  UE/LE.  Discussed upcoming weekend.      Recip. Stair climbing with focus on eccentric quad control during descending.  5 reps      Walking in hallway with consistent recip. Gait pattern/ arm swing (3 laps in hallway)     Tandem Airex (lateral/ forward gait)- min. To no UE assist.  Airex EC/ heel raises 10x.       STS 10x with mirror feedback.  Good L LE control/ no compensation     Resisted gait 2BTB in //-bars (SBA/CGA for safety) 5x all 4-planes (light to no UE assist).            High marching in //-bars (5 laps).       Supine SLR/ hip abduction 10x2 each.  Supine bridging 10x each.      No measurements today.          PATIENT EDUCATION:  Education details: Reviewed HEP Person educated: Patient Education method: Customer service manager Education comprehension: verbalized understanding and returned demonstration  HOME EXERCISE PROGRAM: Standing hip ex./ walking.     ASSESSMENT:  CLINICAL IMPRESSION: Pt. Progressing to more independence with stair climbing/ STS with improved L quad control.   Pt. Will start to use ankle wts. At home with HEP.  Good L knee/ quad muscle control while  descending stairs without UE assist. Pt. Ambulates with normalized gait patten with PT clinic with consistent step pattern/ heel strike.  Pt. Challenged with Airex/ resisted gait ex. With no LOB but occasional UE assist for safety.  Pt. Will benefit from skilled PT services to increase L knee ROM/ strengthening to improve pain-free mobility/ return to gym.     OBJECTIVE IMPAIRMENTS Abnormal gait, decreased activity tolerance, decreased balance, decreased endurance, decreased mobility, difficulty walking, decreased ROM, decreased strength, hypomobility, increased edema, impaired flexibility, and pain.   ACTIVITY LIMITATIONS carrying, lifting, standing, squatting, stairs, transfers, toileting, dressing, and locomotion level  PARTICIPATION LIMITATIONS: meal prep, cleaning, laundry, medication management, driving, shopping, and community activity  PERSONAL FACTORS Age, Fitness, and Past/current experiences are also affecting patient's functional outcome.   REHAB POTENTIAL: Good  CLINICAL DECISION MAKING: Stable/uncomplicated  EVALUATION COMPLEXITY: Low   GOALS: Goals reviewed with patient? Yes  SHORT TERM GOALS: Target date: 02/13/22 Pt. Independent with HEP to increase L knee AROM (0 to 115 deg.) to improve pain-free mobility.   Baseline:  -4 to 99 deg.  01/23/22: 0-116 deg.  Goal status: Met   LONG TERM GOALS: Target date: 03/13/22  Pt. Will increase FOTO to 52 to improve pain-free mobility.   Baseline: initial FOTO 40 Goal status: INITIAL  2.  Pt. Able to ascend/ descend stairs with recip. Pattern and no handrail safely.   Baseline:  Difficulty descending stairs (step to pattern).   Goal status: INITIAL  3.  Pt. Will ambulates community distances with recip pattern and no assistive device safely.   Baseline:  limited distances/ SPC use Goal status: INITIAL   PLAN: PT FREQUENCY: 2x/week  PT DURATION: 8 weeks  PLANNED INTERVENTIONS: Therapeutic exercises, Therapeutic activity,  Neuromuscular re-education, Balance training, Gait training, Patient/Family education, Sullivan Care, Joint mobilization, Aquatic Therapy, Cryotherapy, scar mobilization, and Manual therapy  PLAN FOR NEXT SESSION: Progress L knee ROM.  Discuss return to gym.    Pura Spice, PT, DPT # [phone] 02/05/2022, 12:17 PM

## 2022-02-06 ENCOUNTER — Encounter: Payer: Medicare PPO | Admitting: Physical Therapy

## 2022-02-11 ENCOUNTER — Ambulatory Visit: Payer: Medicare PPO | Attending: Orthopaedic Surgery | Admitting: Physical Therapy

## 2022-02-11 ENCOUNTER — Encounter: Payer: Medicare PPO | Admitting: Physical Therapy

## 2022-02-11 DIAGNOSIS — M25662 Stiffness of left knee, not elsewhere classified: Secondary | ICD-10-CM | POA: Insufficient documentation

## 2022-02-11 DIAGNOSIS — R269 Unspecified abnormalities of gait and mobility: Secondary | ICD-10-CM | POA: Diagnosis not present

## 2022-02-11 DIAGNOSIS — Z96652 Presence of left artificial knee joint: Secondary | ICD-10-CM | POA: Diagnosis not present

## 2022-02-11 DIAGNOSIS — M1712 Unilateral primary osteoarthritis, left knee: Secondary | ICD-10-CM | POA: Diagnosis not present

## 2022-02-11 DIAGNOSIS — M6281 Muscle weakness (generalized): Secondary | ICD-10-CM | POA: Insufficient documentation

## 2022-02-11 NOTE — Therapy (Signed)
OUTPATIENT PHYSICAL THERAPY LOWER EXTREMITY TREATMENT   Patient Name: Diana Sullivan MRN: [id number] DOB:December 13, 1935, 86 y.o., female Today's Date: 02/12/2022   PT End of Session - 02/11/22 0809     Visit Number 8    Number of Visits 17    Date for PT Re-Evaluation 03/13/22    PT Start Time 0810    PT Stop Time 0901    PT Time Calculation (min) 51 min    Activity Tolerance Patient tolerated treatment well             Past Medical History:  Diagnosis Date   Anginal pain ([place])    chest pain occasionally, checked out [facility] (no Issues)   Arthritis    hands   Bronchitis    Complication of anesthesia    patient stated"They had trouble waking me up"   Elevated lipids    Gout    Headache    once every 2 or 3 weeks   History of recent fall    shoulder and knee pain, left side   Hypertension    essential hypertension-controlled on meds   Mitral valve disorder    Muscular dystrophy ([place])    Patient stated someone told her 25 years ago "that she had the gene for it" No issues   MVP (mitral valve prolapse)    Neuropathy    Pre-diabetes    watching diet   Wears partial dentures    upper and lower   Past Surgical History:  Procedure Laterality Date   ABDOMINAL HYSTERECTOMY     adenectomy     BACK SURGERY     BREAST SURGERY Right    biopsy, lumpectomy   BUNIONECTOMY Bilateral    CARDIAC CATHETERIZATION     no issue   CARPAL TUNNEL RELEASE Right    dr Fredna Dow   CATARACT EXTRACTION Left 12/06/2012   CATARACT EXTRACTION W/PHACO Right 11/14/2019   Procedure: CATARACT EXTRACTION PHACO AND INTRAOCULAR LENS PLACEMENT ([place]) RIGHT 4.76  00:44.4;  Surgeon: Eulogio Bear, MD;  Location: [place];  Service: Ophthalmology;  Laterality: Right;   COLONOSCOPY N/A 04/21/2016   Procedure: COLONOSCOPY;  Surgeon: Manya Silvas, MD;  Location: [facility] ENDOSCOPY;  Service: Endoscopy;  Laterality: N/A;  Diabetic   COLONOSCOPY N/A 06/05/2021   Procedure:  COLONOSCOPY;  Surgeon: Toledo, Benay Pike, MD;  Location: [facility] ENDOSCOPY;  Service: Gastroenterology;  Laterality: N/A;   EYE SURGERY     metatarsal osteotomy Left 03/18/2013   TONSILLECTOMY     TOTAL KNEE ARTHROPLASTY Left 12/31/2021   Procedure: LEFT TOTAL KNEE ARTHROPLASTY;  Surgeon: Mcarthur Rossetti, MD;  Location: [place];  Service: Orthopedics;  Laterality: Left;   TYMPANOSTOMY TUBE PLACEMENT     Patient Active Problem List   Diagnosis Date Noted   Unilateral primary osteoarthritis, left knee 12/31/2021   OA (osteoarthritis) of knee 12/31/2021   Status post total left knee replacement 12/31/2021   Ganglion cyst 10/19/2019   Cervical radiculitis 09/30/2019   Neck pain 09/30/2019   Primary osteoarthritis of both hands 09/21/2019   Swelling of right ring finger 09/21/2019   Nondisplaced fracture of proximal phalanx of left lesser toe(s), initial encounter for open fracture 01/03/2019   Obstructive sleep apnea syndrome 12/14/2017   Generalized weakness 07/28/2017   Snoring 07/28/2017   Chronic right hip pain 10/01/2016   Generalized osteoarthritis of hand 10/01/2016   Trigger ring finger of left hand 10/01/2016   Localized, primary osteoarthritis of the ankle and foot,  left 05/15/2014   Chest pain on exertion 01/30/2014   Primary localized osteoarthrosis of ankle and foot 11/16/2013   Statin intolerance 09/03/2013   Hyperlipidemia, unspecified 09/03/2013   Cataract 12/09/2010   Other abnormal glucose 12/09/2010   Mitral valve disorders(424.0) 12/09/2010   Hereditary progressive muscular dystrophy ([place]) 12/09/2010   Benign essential hypertension 12/09/2010   Mitral valve prolapse 12/09/2010   Nonrheumatic mitral valve regurgitation 12/09/2010   Essential hypertension 02/10/2008   S/P cardiac catheterization 06/18/2006    PCP: Jodi Marble, MD  REFERRING PROVIDER: Mcarthur Rossetti, MD  REFERRING DIAG: S/p L total knee replacement  THERAPY DIAG:   Unilateral primary osteoarthritis, left knee  Status post total left knee replacement  Joint stiffness of knee, left  Muscle weakness (generalized)  Gait difficulty  Rationale for Evaluation and Treatment Rehabilitation  ONSET DATE: 12/31/21  (surgery date)  SUBJECTIVE:   SUBJECTIVE STATEMENT:  02/11/22 Pt. Has returned to driving to [place] and appointments with no limitations.  Pt. Reports minimal (1/10) L knee joint line knee pain prior to tx.  Pt. Returns to ortho hand MD tomorrow and knee surgeon 9/13.    PERTINENT HISTORY: Pt. Reports h/o L shoulder/R knee arthritis.  Pt. Lives alone in 1 story house.  Pt. Independent with driving prior to TKA.  Pt. Enjoys quilting and wants to return to gym.    PAIN:  Are you having pain? Yes: NPRS scale: 0-1/10 Pain location: L knee Pain description: sore/ aching Aggravating factors: bending Relieving factors: rest  PRECAUTIONS: None  WEIGHT BEARING RESTRICTIONS No  FALLS:  Has patient fallen in last 6 months? No  LIVING ENVIRONMENT: Lives with: lives with their family and lives alone Lives in: House/apartment Stairs: Yes: External: 5 steps; on left going up Has following equipment at home: Single point cane  OCCUPATION: retired  PLOF: Independent  PATIENT GOALS Increase L knee ROM/ strengthening and return to gym/ aquatic ex.    OBJECTIVE:   DIAGNOSTIC FINDINGS: PORTABLE LEFT KNEE - 1-2 VIEW   COMPARISON:  Portable exam 1415 hours compared to 10/23/2021   FINDINGS: Components of LEFT knee prosthesis identified in expected positions.   No acute fracture, dislocation, or bone destruction.   IMPRESSION: LEFT knee prosthesis without acute complication.     Electronically Signed   By: Lavonia Dana M.D.   On: 12/31/2021 14:37  PATIENT SURVEYS:  FOTO initial 40/ goal 52  COGNITION:  Overall cognitive status: Within functional limits for tasks assessed     SENSATION: WFL  EDEMA:  Circumferential: L/R joint  line (45.5/ 39.5 cm.), distal quad (48.5/42.5 cm), mid-calf (38/ 34.5 cm)  POSTURE: rounded shoulders and forward head  PALPATION: Tenderness around L knee joint/ patella  LOWER EXTREMITY ROM:  Active ROM Right eval Left eval  Hip flexion [facility]  Hip extension    Hip abduction    Hip adduction    Hip internal rotation    Hip external rotation    Knee flexion 137 deg. 99 deg.  Knee extension 0 deg. -4 deg.  Ankle dorsiflexion    Ankle plantarflexion    Ankle inversion    Ankle eversion     (Blank rows = not tested)  LOWER EXTREMITY MMT:  B LE muscle strength grossly 4+/5 MMT except L knee ext./ flexion 4/5 MMT.    GAIT: Distance walked: in clinic Assistive device utilized: Single point cane Level of assistance: Modified independence Comments: Pt. Has been using SPC for a week.  2-point gait  pattern.    TODAY'S TREATMENT:     02/11/22:     Nustep L5 10 min. Seat #10-9 with B UE/LE.  Discussed Labor Day weekend.  Discussed pt. Ability to wash outside windows and use a ladder (not advised by PT).        Recip. Stair climbing with focus on eccentric quad control during descending.  5 reps     Walking alt. UE/LE touches and braiding in //-bars 3 laps each.  Minimal UE assist required.         Resisted gait 2BTB in //-bars (SBA/CGA for safety) 5x all 4-planes (light to no UE assist).     Standing squats at //-bars 10x with excellent technique/ standing heel raises/ marching 20x.       STS 10x with mirror feedback.  Good L LE control/ no compensation     Walking in hallway with consistent recip. Gait pattern/ arm swing (3 laps in hallway).      Walking outside to stairs (descending) with recip. Pattern and ascending the hill with consistent recip. Pattern and on LOB.  Pt. Walked around PT building to car with no issues.       PATIENT EDUCATION:  Education details: Reviewed HEP Person educated: Patient Education method: Customer service manager Education  comprehension: verbalized understanding and returned demonstration  HOME EXERCISE PROGRAM: Standing hip ex./ walking.     ASSESSMENT:  CLINICAL IMPRESSION: Pt works hard with LE strengthening/ functional mobility training.  No LOB and goo L knee/ quad muscle control while descending stairs without UE assist outside.  Pt. Ambulates with normalized gait patten with PT clinic with consistent step pattern/ heel strike.  Pt. Has returned to completing more household tasks, driving and returning to errands without son's assist.  Pt. Will benefit from skilled PT services to increase L knee ROM/ strengthening to improve pain-free mobility/ return to gym.     OBJECTIVE IMPAIRMENTS Abnormal gait, decreased activity tolerance, decreased balance, decreased endurance, decreased mobility, difficulty walking, decreased ROM, decreased strength, hypomobility, increased edema, impaired flexibility, and pain.   ACTIVITY LIMITATIONS carrying, lifting, standing, squatting, stairs, transfers, toileting, dressing, and locomotion level  PARTICIPATION LIMITATIONS: meal prep, cleaning, laundry, medication management, driving, shopping, and community activity  PERSONAL FACTORS Age, Fitness, and Past/current experiences are also affecting patient's functional outcome.   REHAB POTENTIAL: Good  CLINICAL DECISION MAKING: Stable/uncomplicated  EVALUATION COMPLEXITY: Low   GOALS: Goals reviewed with patient? Yes  SHORT TERM GOALS: Target date: 02/13/22 Pt. Independent with HEP to increase L knee AROM (0 to 115 deg.) to improve pain-free mobility.   Baseline:  -4 to 99 deg.  01/23/22: 0-116 deg.  Goal status: Met   LONG TERM GOALS: Target date: 03/13/22  Pt. Will increase FOTO to 52 to improve pain-free mobility.   Baseline: initial FOTO 40 Goal status: INITIAL  2.  Pt. Able to ascend/ descend stairs with recip. Pattern and no handrail safely.   Baseline:  Difficulty descending stairs (step to pattern).   Goal  status: INITIAL  3.  Pt. Will ambulates community distances with recip pattern and no assistive device safely.   Baseline:  limited distances/ SPC use Goal status: INITIAL   PLAN: PT FREQUENCY: 2x/week  PT DURATION: 8 weeks  PLANNED INTERVENTIONS: Therapeutic exercises, Therapeutic activity, Neuromuscular re-education, Balance training, Gait training, Patient/Family education, Self Care, Joint mobilization, Aquatic Therapy, Cryotherapy, scar mobilization, and Manual therapy  PLAN FOR NEXT SESSION: Progress L knee ROM.  Discuss return to gym.  Pura Spice, PT, DPT # [phone] 02/12/2022, 11:45 AM

## 2022-02-12 ENCOUNTER — Encounter: Payer: Self-pay | Admitting: Physical Therapy

## 2022-02-12 DIAGNOSIS — R7303 Prediabetes: Secondary | ICD-10-CM | POA: Diagnosis not present

## 2022-02-12 DIAGNOSIS — M2041 Other hammer toe(s) (acquired), right foot: Secondary | ICD-10-CM | POA: Diagnosis not present

## 2022-02-12 DIAGNOSIS — B351 Tinea unguium: Secondary | ICD-10-CM | POA: Diagnosis not present

## 2022-02-12 DIAGNOSIS — L851 Acquired keratosis [keratoderma] palmaris et plantaris: Secondary | ICD-10-CM | POA: Diagnosis not present

## 2022-02-12 DIAGNOSIS — M2042 Other hammer toe(s) (acquired), left foot: Secondary | ICD-10-CM | POA: Diagnosis not present

## 2022-02-12 DIAGNOSIS — M2032 Hallux varus (acquired), left foot: Secondary | ICD-10-CM | POA: Diagnosis not present

## 2022-02-13 ENCOUNTER — Encounter: Payer: Medicare PPO | Admitting: Physical Therapy

## 2022-02-17 ENCOUNTER — Encounter: Payer: Self-pay | Admitting: Physical Therapy

## 2022-02-17 ENCOUNTER — Ambulatory Visit: Payer: Medicare PPO | Admitting: Physical Therapy

## 2022-02-17 DIAGNOSIS — M25662 Stiffness of left knee, not elsewhere classified: Secondary | ICD-10-CM

## 2022-02-17 DIAGNOSIS — M6281 Muscle weakness (generalized): Secondary | ICD-10-CM

## 2022-02-17 DIAGNOSIS — R269 Unspecified abnormalities of gait and mobility: Secondary | ICD-10-CM

## 2022-02-17 DIAGNOSIS — Z96652 Presence of left artificial knee joint: Secondary | ICD-10-CM | POA: Diagnosis not present

## 2022-02-17 DIAGNOSIS — M1712 Unilateral primary osteoarthritis, left knee: Secondary | ICD-10-CM

## 2022-02-17 NOTE — Therapy (Signed)
OUTPATIENT PHYSICAL THERAPY LOWER EXTREMITY TREATMENT   Patient Name: Diana Sullivan MRN: [id number] DOB:July 23, 1935, 86 y.o., female Today's Date: 02/17/2022   PT End of Session - 02/17/22 1320     Visit Number 9    Number of Visits 17    Date for PT Re-Evaluation 03/13/22    PT Start Time 1307    PT Stop Time 1353    PT Time Calculation (min) 46 min    Activity Tolerance Patient tolerated treatment well             Past Medical History:  Diagnosis Date   Anginal pain ([place])    chest pain occasionally, checked out [facility] (no Issues)   Arthritis    hands   Bronchitis    Complication of anesthesia    patient stated"They had trouble waking me up"   Elevated lipids    Gout    Headache    once every 2 or 3 weeks   History of recent fall    shoulder and knee pain, left side   Hypertension    essential hypertension-controlled on meds   Mitral valve disorder    Muscular dystrophy ([place])    Patient stated someone told her 25 years ago "that she had the gene for it" No issues   MVP (mitral valve prolapse)    Neuropathy    Pre-diabetes    watching diet   Wears partial dentures    upper and lower   Past Surgical History:  Procedure Laterality Date   ABDOMINAL HYSTERECTOMY     adenectomy     BACK SURGERY     BREAST SURGERY Right    biopsy, lumpectomy   BUNIONECTOMY Bilateral    CARDIAC CATHETERIZATION     no issue   CARPAL TUNNEL RELEASE Right    dr Fredna Dow   CATARACT EXTRACTION Left 12/06/2012   CATARACT EXTRACTION W/PHACO Right 11/14/2019   Procedure: CATARACT EXTRACTION PHACO AND INTRAOCULAR LENS PLACEMENT ([facility]) RIGHT 4.76  00:44.4;  Surgeon: Eulogio Bear, MD;  Location: [place];  Service: Ophthalmology;  Laterality: Right;   COLONOSCOPY N/A 04/21/2016   Procedure: COLONOSCOPY;  Surgeon: Manya Silvas, MD;  Location: [facility] ENDOSCOPY;  Service: Endoscopy;  Laterality: N/A;  Diabetic   COLONOSCOPY N/A 06/05/2021   Procedure:  COLONOSCOPY;  Surgeon: Toledo, Benay Pike, MD;  Location: [facility] ENDOSCOPY;  Service: Gastroenterology;  Laterality: N/A;   EYE SURGERY     metatarsal osteotomy Left 03/18/2013   TONSILLECTOMY     TOTAL KNEE ARTHROPLASTY Left 12/31/2021   Procedure: LEFT TOTAL KNEE ARTHROPLASTY;  Surgeon: Mcarthur Rossetti, MD;  Location: [place];  Service: Orthopedics;  Laterality: Left;   TYMPANOSTOMY TUBE PLACEMENT     Patient Active Problem List   Diagnosis Date Noted   Unilateral primary osteoarthritis, left knee 12/31/2021   OA (osteoarthritis) of knee 12/31/2021   Status post total left knee replacement 12/31/2021   Ganglion cyst 10/19/2019   Cervical radiculitis 09/30/2019   Neck pain 09/30/2019   Primary osteoarthritis of both hands 09/21/2019   Swelling of right ring finger 09/21/2019   Nondisplaced fracture of proximal phalanx of left lesser toe(s), initial encounter for open fracture 01/03/2019   Obstructive sleep apnea syndrome 12/14/2017   Generalized weakness 07/28/2017   Snoring 07/28/2017   Chronic right hip pain 10/01/2016   Generalized osteoarthritis of hand 10/01/2016   Trigger ring finger of left hand 10/01/2016   Localized, primary osteoarthritis of the ankle and foot,  left 05/15/2014   Chest pain on exertion 01/30/2014   Primary localized osteoarthrosis of ankle and foot 11/16/2013   Statin intolerance 09/03/2013   Hyperlipidemia, unspecified 09/03/2013   Cataract 12/09/2010   Other abnormal glucose 12/09/2010   Mitral valve disorders(424.0) 12/09/2010   Hereditary progressive muscular dystrophy ([place]) 12/09/2010   Benign essential hypertension 12/09/2010   Mitral valve prolapse 12/09/2010   Nonrheumatic mitral valve regurgitation 12/09/2010   Essential hypertension 02/10/2008   S/P cardiac catheterization 06/18/2006    PCP: Jodi Marble, MD  REFERRING PROVIDER: Mcarthur Rossetti, MD  REFERRING DIAG: S/p L total knee replacement  THERAPY DIAG:   Unilateral primary osteoarthritis, left knee  Status post total left knee replacement  Joint stiffness of knee, left  Muscle weakness (generalized)  Gait difficulty  Rationale for Evaluation and Treatment Rehabilitation  ONSET DATE: 12/31/21  (surgery date)  SUBJECTIVE:   SUBJECTIVE STATEMENT:  02/17/22 Pt. States she is feeling better since feeling tired last week.  Pt. States she was busy with MD appts. And her car has no AC.  Pt. Reports minimal (1/10) L knee joint line knee pain prior to tx.  Pt. Returns to Dr. Ninfa Linden on 9/13.    PERTINENT HISTORY: Pt. Reports h/o L shoulder/R knee arthritis.  Pt. Lives alone in 1 story house.  Pt. Independent with driving prior to TKA.  Pt. Enjoys quilting and wants to return to gym.    PAIN:  Are you having pain? Yes: NPRS scale: 0-1/10 Pain location: L knee Pain description: sore/ aching Aggravating factors: bending Relieving factors: rest  PRECAUTIONS: None  WEIGHT BEARING RESTRICTIONS No  FALLS:  Has patient fallen in last 6 months? No  LIVING ENVIRONMENT: Lives with: lives with their family and lives alone Lives in: House/apartment Stairs: Yes: External: 5 steps; on left going up Has following equipment at home: Single point cane  OCCUPATION: retired  PLOF: Independent  PATIENT GOALS Increase L knee ROM/ strengthening and return to gym/ aquatic ex.    OBJECTIVE:   DIAGNOSTIC FINDINGS: PORTABLE LEFT KNEE - 1-2 VIEW   COMPARISON:  Portable exam 1415 hours compared to 10/23/2021   FINDINGS: Components of LEFT knee prosthesis identified in expected positions.   No acute fracture, dislocation, or bone destruction.   IMPRESSION: LEFT knee prosthesis without acute complication.     Electronically Signed   By: Lavonia Dana M.D.   On: 12/31/2021 14:37  PATIENT SURVEYS:  FOTO initial 40/ goal 52  COGNITION:  Overall cognitive status: Within functional limits for tasks assessed     SENSATION: WFL  EDEMA:   Circumferential: L/R joint line (45.5/ 39.5 cm.), distal quad (48.5/42.5 cm), mid-calf (38/ 34.5 cm)  POSTURE: rounded shoulders and forward head  PALPATION: Tenderness around L knee joint/ patella  LOWER EXTREMITY ROM:  Active ROM Right eval Left eval  Hip flexion [facility]  Hip extension    Hip abduction    Hip adduction    Hip internal rotation    Hip external rotation    Knee flexion 137 deg. 99 deg.  Knee extension 0 deg. -4 deg.  Ankle dorsiflexion    Ankle plantarflexion    Ankle inversion    Ankle eversion     (Blank rows = not tested)  LOWER EXTREMITY MMT:  B LE muscle strength grossly 4+/5 MMT except L knee ext./ flexion 4/5 MMT.    GAIT: Distance walked: in clinic Assistive device utilized: Single point cane Level of assistance: Modified independence Comments: Pt.  Has been using SPC for a week.  2-point gait pattern.    TODAY'S TREATMENT:     02/17/22:     Nustep L5 10 min. Seat #10-9 with B UE/LE.  Discussed returning to gym and pts. Fear of being exposed to Covid.      Walking in hallway with increase cadence.  4# ankle wts. In //-bars: forward/ backwards/ lateral 4 laps each with upright posture.  Seated LAQ 20x.  Standing heel raises 20x.      4# ankle wts.: step ups/ eccentric step downs on 6" step in //-bars.       Resisted gait 2BTB in //-bars (SBA/CGA for safety) 5x all 4-planes (light to no UE assist).        Walking in hallway with consistent recip. Gait pattern/ arm swing (3 laps in hallway).      Supine L knee AROM reassessment: 0-129 deg. (No pain reported)- good patella tracking.      PATIENT EDUCATION:  Education details: Reviewed HEP Person educated: Patient Education method: Customer service manager Education comprehension: verbalized understanding and returned demonstration  HOME EXERCISE PROGRAM: Standing hip ex./ walking.     ASSESSMENT:  CLINICAL IMPRESSION: Pt works hard with LE strengthening/ functional mobility  training.  Pt. Ambulates with normalized gait patten with PT clinic with consistent step pattern/ heel strike.  PT discussed pt. Return to gym based ex. But pt. Is concerned with increase rates of Covid in community.  Pt. Wearing a mask during tx. The past 2 weeks due to fear of Covid.  Excellent L knee AROM (0-129 deg.) in supine position.  Pt. Has L LE muscle fatigue/ weakness with increase activity.  Pt. Will benefit from skilled PT services to increase L knee ROM/ strengthening to improve pain-free mobility/ return to gym.     OBJECTIVE IMPAIRMENTS Abnormal gait, decreased activity tolerance, decreased balance, decreased endurance, decreased mobility, difficulty walking, decreased ROM, decreased strength, hypomobility, increased edema, impaired flexibility, and pain.   ACTIVITY LIMITATIONS carrying, lifting, standing, squatting, stairs, transfers, toileting, dressing, and locomotion level  PARTICIPATION LIMITATIONS: meal prep, cleaning, laundry, medication management, driving, shopping, and community activity  PERSONAL FACTORS Age, Fitness, and Past/current experiences are also affecting patient's functional outcome.   REHAB POTENTIAL: Good  CLINICAL DECISION MAKING: Stable/uncomplicated  EVALUATION COMPLEXITY: Low   GOALS: Goals reviewed with patient? Yes  SHORT TERM GOALS: Target date: 02/13/22 Pt. Independent with HEP to increase L knee AROM (0 to 115 deg.) to improve pain-free mobility.   Baseline:  -4 to 99 deg.  01/23/22: 0-116 deg. 9/11: 0-129 deg. Goal status: Met   LONG TERM GOALS: Target date: 03/13/22  Pt. Will increase FOTO to 52 to improve pain-free mobility.   Baseline: initial FOTO 40 Goal status: INITIAL  2.  Pt. Able to ascend/ descend stairs with recip. Pattern and no handrail safely.   Baseline:  Difficulty descending stairs (step to pattern).   Goal status: INITIAL  3.  Pt. Will ambulates community distances with recip pattern and no assistive device safely.    Baseline:  limited distances/ SPC use Goal status: INITIAL   PLAN: PT FREQUENCY: 2x/week  PT DURATION: 8 weeks  PLANNED INTERVENTIONS: Therapeutic exercises, Therapeutic activity, Neuromuscular re-education, Balance training, Gait training, Patient/Family education, Self Care, Joint mobilization, Aquatic Therapy, Cryotherapy, scar mobilization, and Manual therapy  PLAN FOR NEXT SESSION: Progress L knee ROM.  CHECK GOALS/ 10th visit progress note prior to MD appt. On 9/13 at [place]  Clementeen Hoof, PT, DPT # [id number] 02/17/2022, 2:01 PM

## 2022-02-18 ENCOUNTER — Encounter: Payer: Medicare PPO | Admitting: Physical Therapy

## 2022-02-18 DIAGNOSIS — J209 Acute bronchitis, unspecified: Secondary | ICD-10-CM | POA: Diagnosis not present

## 2022-02-18 DIAGNOSIS — Z20822 Contact with and (suspected) exposure to covid-19: Secondary | ICD-10-CM | POA: Diagnosis not present

## 2022-02-19 ENCOUNTER — Ambulatory Visit (INDEPENDENT_AMBULATORY_CARE_PROVIDER_SITE_OTHER): Payer: Medicare PPO | Admitting: Orthopaedic Surgery

## 2022-02-19 ENCOUNTER — Ambulatory Visit: Payer: Medicare PPO | Admitting: Physical Therapy

## 2022-02-19 ENCOUNTER — Encounter: Payer: Medicare PPO | Admitting: Physical Therapy

## 2022-02-19 ENCOUNTER — Encounter: Payer: Self-pay | Admitting: Orthopaedic Surgery

## 2022-02-19 DIAGNOSIS — M25662 Stiffness of left knee, not elsewhere classified: Secondary | ICD-10-CM

## 2022-02-19 DIAGNOSIS — M1712 Unilateral primary osteoarthritis, left knee: Secondary | ICD-10-CM | POA: Diagnosis not present

## 2022-02-19 DIAGNOSIS — R269 Unspecified abnormalities of gait and mobility: Secondary | ICD-10-CM

## 2022-02-19 DIAGNOSIS — Z96652 Presence of left artificial knee joint: Secondary | ICD-10-CM | POA: Diagnosis not present

## 2022-02-19 DIAGNOSIS — M6281 Muscle weakness (generalized): Secondary | ICD-10-CM | POA: Diagnosis not present

## 2022-02-19 NOTE — Therapy (Signed)
OUTPATIENT PHYSICAL THERAPY LOWER EXTREMITY TREATMENT Physical Therapy Progress Note  Dates of reporting period  01/16/22  to   02/19/22   Patient Name: Diana Sullivan MRN: [id number] DOB:13-Nov-1935, 86 y.o., female Today's Date: 02/19/2022   PT End of Session - 02/19/22 1127     Visit Number 10    Number of Visits 17    Date for PT Re-Evaluation 03/13/22    PT Start Time 1118    PT Stop Time 1157    PT Time Calculation (min) 39 min    Activity Tolerance Patient tolerated treatment well             Past Medical History:  Diagnosis Date   Anginal pain ([place])    chest pain occasionally, checked out [facility] (no Issues)   Arthritis    hands   Bronchitis    Complication of anesthesia    patient stated"They had trouble waking me up"   Elevated lipids    Gout    Headache    once every 2 or 3 weeks   History of recent fall    shoulder and knee pain, left side   Hypertension    essential hypertension-controlled on meds   Mitral valve disorder    Muscular dystrophy ([place])    Patient stated someone told her 25 years ago "that she had the gene for it" No issues   MVP (mitral valve prolapse)    Neuropathy    Pre-diabetes    watching diet   Wears partial dentures    upper and lower   Past Surgical History:  Procedure Laterality Date   ABDOMINAL HYSTERECTOMY     adenectomy     BACK SURGERY     BREAST SURGERY Right    biopsy, lumpectomy   BUNIONECTOMY Bilateral    CARDIAC CATHETERIZATION     no issue   CARPAL TUNNEL RELEASE Right    dr Fredna Dow   CATARACT EXTRACTION Left 12/06/2012   CATARACT EXTRACTION W/PHACO Right 11/14/2019   Procedure: CATARACT EXTRACTION PHACO AND INTRAOCULAR LENS PLACEMENT (IOC) RIGHT 4.76  00:44.4;  Surgeon: Eulogio Bear, MD;  Location: [place];  Service: Ophthalmology;  Laterality: Right;   COLONOSCOPY N/A 04/21/2016   Procedure: COLONOSCOPY;  Surgeon: Manya Silvas, MD;  Location: [facility] ENDOSCOPY;   Service: Endoscopy;  Laterality: N/A;  Diabetic   COLONOSCOPY N/A 06/05/2021   Procedure: COLONOSCOPY;  Surgeon: Toledo, Benay Pike, MD;  Location: [facility] ENDOSCOPY;  Service: Gastroenterology;  Laterality: N/A;   EYE SURGERY     metatarsal osteotomy Left 03/18/2013   TONSILLECTOMY     TOTAL KNEE ARTHROPLASTY Left 12/31/2021   Procedure: LEFT TOTAL KNEE ARTHROPLASTY;  Surgeon: Mcarthur Rossetti, MD;  Location: [place];  Service: Orthopedics;  Laterality: Left;   TYMPANOSTOMY TUBE PLACEMENT     Patient Active Problem List   Diagnosis Date Noted   Unilateral primary osteoarthritis, left knee 12/31/2021   OA (osteoarthritis) of knee 12/31/2021   Status post total left knee replacement 12/31/2021   Ganglion cyst 10/19/2019   Cervical radiculitis 09/30/2019   Neck pain 09/30/2019   Primary osteoarthritis of both hands 09/21/2019   Swelling of right ring finger 09/21/2019   Nondisplaced fracture of proximal phalanx of left lesser toe(s), initial encounter for open fracture 01/03/2019   Obstructive sleep apnea syndrome 12/14/2017   Generalized weakness 07/28/2017   Snoring 07/28/2017   Chronic right hip pain 10/01/2016   Generalized osteoarthritis of hand 10/01/2016   Trigger  ring finger of left hand 10/01/2016   Localized, primary osteoarthritis of the ankle and foot, left 05/15/2014   Chest pain on exertion 01/30/2014   Primary localized osteoarthrosis of ankle and foot 11/16/2013   Statin intolerance 09/03/2013   Hyperlipidemia, unspecified 09/03/2013   Cataract 12/09/2010   Other abnormal glucose 12/09/2010   Mitral valve disorders(424.0) 12/09/2010   Hereditary progressive muscular dystrophy ([place]) 12/09/2010   Benign essential hypertension 12/09/2010   Mitral valve prolapse 12/09/2010   Nonrheumatic mitral valve regurgitation 12/09/2010   Essential hypertension 02/10/2008   S/P cardiac catheterization 06/18/2006    PCP: Jodi Marble, MD  REFERRING PROVIDER: Mcarthur Rossetti, MD  REFERRING DIAG: S/p L total knee replacement  THERAPY DIAG:  Unilateral primary osteoarthritis, left knee  Status post total left knee replacement  Joint stiffness of knee, left  Muscle weakness (generalized)  Gait difficulty  Rationale for Evaluation and Treatment Rehabilitation  ONSET DATE: 12/31/21  (surgery date)  SUBJECTIVE:   SUBJECTIVE STATEMENT:  02/19/22 Pt. Reports minimal (1/10) L knee joint line knee pain (lateral aspect) prior to tx.  Pt. Returns to Dr. Ninf[facility] Linden at [facility] today.     PERTINENT HISTORY: Pt. Reports h/o L shoulder/R knee arthritis.  Pt. Lives alone in 1 story house.  Pt. Independent with driving prior to TKA.  Pt. Enjoys quilting and wants to return to gym.    PAIN:  Are you having pain? Yes: NPRS scale: 0-1/10 Pain location: L knee Pain description: sore/ aching Aggravating factors: bending Relieving factors: rest  PRECAUTIONS: None  WEIGHT BEARING RESTRICTIONS No  FALLS:  Has patient fallen in last 6 months? No  LIVING ENVIRONMENT: Lives with: lives with their family and lives alone Lives in: House/apartment Stairs: Yes: External: 5 steps; on left going up Has following equipment at home: Single point cane  OCCUPATION: retired  PLOF: Independent  PATIENT GOALS Increase L knee ROM/ strengthening and return to gym/ aquatic ex.    OBJECTIVE:   DIAGNOSTIC FINDINGS: PORTABLE LEFT KNEE - 1-2 VIEW   COMPARISON:  Portable exam 1415 hours compared to 10/23/2021   FINDINGS: Components of LEFT knee prosthesis identified in expected positions.   No acute fracture, dislocation, or bone destruction.   IMPRESSION: LEFT knee prosthesis without acute complication.     Electronically Signed   By: Lavonia Dana M.D.   On: 12/31/2021 14:37  PATIENT SURVEYS:  FOTO initial 40/ goal 52   9/13: 67 (goal met)  COGNITION:  Overall cognitive status: Within functional limits for tasks assessed     SENSATION: WFL  EDEMA:   Circumferential: L/R joint line (45.5/ 39.5 cm.), distal quad (48.5/42.5 cm), mid-calf (38/ 34.5 cm)  POSTURE: rounded shoulders and forward head  PALPATION: Tenderness around L knee joint/ patella  LOWER EXTREMITY ROM:  Active ROM Right eval Left eval  Hip flexion [facility]  Hip extension    Hip abduction    Hip adduction    Hip internal rotation    Hip external rotation    Knee flexion 137 deg. 99 deg.  Knee extension 0 deg. -4 deg.  Ankle dorsiflexion    Ankle plantarflexion    Ankle inversion    Ankle eversion     (Blank rows = not tested)  LOWER EXTREMITY MMT:  B LE muscle strength grossly 4+/5 MMT except L knee ext./ flexion 4/5 MMT.    GAIT: Distance walked: in clinic Assistive device utilized: Single point cane Level of assistance: Modified independence Comments: Pt. Has  been using SPC for a week.  2-point gait pattern.    TODAY'S TREATMENT:     02/19/22:     Nustep L5 10 min. Seat #10-9 with B UE/LE.       Walking in clinic with consistent recip. Gait pattern and good upright posture (no issues reported)     Recip. Stairs (ascending/ descending) 4 steps x 5.  Minimal to no UE assist on handrails.     Nautilus: resisted gait 70# forward/ backwards and 50# L/R lateral walking 5x each.  SBA for safety/ cuing.  No LOB.       STS from blue mat with no UE assist (good L knee control)- discussed HEP.       L knee AROM reassessment: 0-129 deg. (No pain reported)- good patella tracking.  See updated goals.      PATIENT EDUCATION:  Education details: Reviewed HEP Person educated: Patient Education method: Customer service manager Education comprehension: verbalized understanding and returned demonstration  HOME EXERCISE PROGRAM: Standing hip ex./ walking.     ASSESSMENT:  CLINICAL IMPRESSION: Pt works hard with LE strengthening/ functional mobility training.  Pt. Ambulates with normalized gait patten with PT clinic with consistent step pattern/ heel  strike.  Excellent L knee AROM (0-129 deg.) in supine/seated position.  Pt. Has L LE muscle fatigue/ weakness with increase activity.  Pt. Has progressed well towards all goals.  Pt. Returns to MD for f/u today.  Pt. Will contact PT if any changes to current POC.     OBJECTIVE IMPAIRMENTS Abnormal gait, decreased activity tolerance, decreased balance, decreased endurance, decreased mobility, difficulty walking, decreased ROM, decreased strength, hypomobility, increased edema, impaired flexibility, and pain.   ACTIVITY LIMITATIONS carrying, lifting, standing, squatting, stairs, transfers, toileting, dressing, and locomotion level  PARTICIPATION LIMITATIONS: meal prep, cleaning, laundry, medication management, driving, shopping, and community activity  PERSONAL FACTORS Age, Fitness, and Past/current experiences are also affecting patient's functional outcome.   REHAB POTENTIAL: Good  CLINICAL DECISION MAKING: Stable/uncomplicated  EVALUATION COMPLEXITY: Low   GOALS: Goals reviewed with patient? Yes  SHORT TERM GOALS: Target date: 02/13/22 Pt. Independent with HEP to increase L knee AROM (0 to 115 deg.) to improve pain-free mobility.   Baseline:  -4 to 99 deg.  01/23/22: 0-116 deg. 9/11: 0-129 deg. Goal status: Met   LONG TERM GOALS: Target date: 03/13/22  Pt. Will increase FOTO to 52 to improve pain-free mobility.   Baseline: initial FOTO 40.  9/13: 67 Goal status: Goal met  2.  Pt. Able to ascend/ descend stairs with recip. Pattern and no handrail safely.   Baseline:  Difficulty descending stairs (step to pattern).  9/13: recip. Stairs with no issues Goal status: Goal met  3.  Pt. Will ambulates community distances with recip pattern and no assistive device safely.   Baseline:  limited distances/ SPC use Goal status: Goal met   PLAN: PT FREQUENCY: 2x/week  PT DURATION: 8 weeks  PLANNED INTERVENTIONS: Therapeutic exercises, Therapeutic activity, Neuromuscular re-education,  Balance training, Gait training, Patient/Family education, Self Care, Joint mobilization, Aquatic Therapy, Cryotherapy, scar mobilization, and Manual therapy  PLAN FOR NEXT SESSION:  Pt. Will contact PT after MD visit to discuss any changes to POC.  Probable discharge from PT  Pura Spice, PT, DPT # [phone] 02/19/2022, 12:19 PM

## 2022-02-19 NOTE — Progress Notes (Signed)
The patient is an active 86 year old female who is now 7 weeks status post a left total knee arthroplasty.  She reports good range of motion and strength.  She has a little bit of soreness and is been still going to physical therapy.  On exam her range of motion is entirely full of her left operative knee.  There is only some mild swelling.  It does feel ligamentously stable.  She is doing so well she can transition out of outpatient physical therapy if she would like to.  She can get her knee wet under water if needed.  The next time I need to see her back is not for 6 months unless there are issues.  We will have an AP and lateral of her left knee at that visit.  All questions and concerns were answered and addressed.

## 2022-02-20 ENCOUNTER — Encounter: Payer: Medicare PPO | Admitting: Physical Therapy

## 2022-02-24 ENCOUNTER — Ambulatory Visit: Payer: Medicare PPO | Admitting: Physical Therapy

## 2022-02-24 DIAGNOSIS — R269 Unspecified abnormalities of gait and mobility: Secondary | ICD-10-CM

## 2022-02-24 DIAGNOSIS — M1712 Unilateral primary osteoarthritis, left knee: Secondary | ICD-10-CM

## 2022-02-24 DIAGNOSIS — M6281 Muscle weakness (generalized): Secondary | ICD-10-CM | POA: Diagnosis not present

## 2022-02-24 DIAGNOSIS — Z96652 Presence of left artificial knee joint: Secondary | ICD-10-CM

## 2022-02-24 DIAGNOSIS — M25662 Stiffness of left knee, not elsewhere classified: Secondary | ICD-10-CM | POA: Diagnosis not present

## 2022-02-24 NOTE — Therapy (Signed)
OUTPATIENT PHYSICAL THERAPY LOWER EXTREMITY TREATMENT/DISCHARGE   Patient Name: Diana Sullivan MRN: [id number] DOB:[birth year], 86 y.o., female Today's Date: 02/24/2022   PT End of Session - 02/24/22 0927     Visit Number 11    Number of Visits 17    Date for PT Re-Evaluation 03/13/22    PT Start Time 0952    PT Stop Time 1034    PT Time Calculation (min) 42 min    Activity Tolerance Patient tolerated treatment well             Past Medical History:  Diagnosis Date   Anginal pain ([place])    chest pain occasionally, checked out [facility] (no Issues)   Arthritis    hands   Bronchitis    Complication of anesthesia    patient stated"They had trouble waking me up"   Elevated lipids    Gout    Headache    once every 2 or 3 weeks   History of recent fall    shoulder and knee pain, left side   Hypertension    essential hypertension-controlled on meds   Mitral valve disorder    Muscular dystrophy ([place])    Patient stated someone told her 25 years ago "that she had the gene for it" No issues   MVP (mitral valve prolapse)    Neuropathy    Pre-diabetes    watching diet   Wears partial dentures    upper and lower   Past Surgical History:  Procedure Laterality Date   ABDOMINAL HYSTERECTOMY     adenectomy     BACK SURGERY     BREAST SURGERY Right    biopsy, lumpectomy   BUNIONECTOMY Bilateral    CARDIAC CATHETERIZATION     no issue   CARPAL TUNNEL RELEASE Right    dr Fredna Dow   CATARACT EXTRACTION Left 12/06/2012   CATARACT EXTRACTION W/PHACO Right 11/14/2019   Procedure: CATARACT EXTRACTION PHACO AND INTRAOCULAR LENS PLACEMENT (IOC) RIGHT 4.76  00:44.4;  Surgeon: Eulogio Bear, MD;  Location: [place];  Service: Ophthalmology;  Laterality: Right;   COLONOSCOPY N/A 04/21/2016   Procedure: COLONOSCOPY;  Surgeon: Manya Silvas, MD;  Location: [facility] ENDOSCOPY;  Service: Endoscopy;  Laterality: N/A;  Diabetic   COLONOSCOPY N/A 06/05/2021    Procedure: COLONOSCOPY;  Surgeon: Toledo, Benay Pike, MD;  Location: [facility] ENDOSCOPY;  Service: Gastroenterology;  Laterality: N/A;   EYE SURGERY     metatarsal osteotomy Left 03/18/2013   TONSILLECTOMY     TOTAL KNEE ARTHROPLASTY Left 12/31/2021   Procedure: LEFT TOTAL KNEE ARTHROPLASTY;  Surgeon: Mcarthur Rossetti, MD;  Location: [place];  Service: Orthopedics;  Laterality: Left;   TYMPANOSTOMY TUBE PLACEMENT     Patient Active Problem List   Diagnosis Date Noted   Unilateral primary osteoarthritis, left knee 12/31/2021   OA (osteoarthritis) of knee 12/31/2021   Status post total left knee replacement 12/31/2021   Ganglion cyst 10/19/2019   Cervical radiculitis 09/30/2019   Neck pain 09/30/2019   Primary osteoarthritis of both hands 09/21/2019   Swelling of right ring finger 09/21/2019   Nondisplaced fracture of proximal phalanx of left lesser toe(s), initial encounter for open fracture 01/03/2019   Obstructive sleep apnea syndrome 12/14/2017   Generalized weakness 07/28/2017   Snoring 07/28/2017   Chronic right hip pain 10/01/2016   Generalized osteoarthritis of hand 10/01/2016   Trigger ring finger of left hand 10/01/2016   Localized, primary osteoarthritis of the ankle and foot,  left 05/15/2014   Chest pain on exertion 01/30/2014   Primary localized osteoarthrosis of ankle and foot 11/16/2013   Statin intolerance 09/03/2013   Hyperlipidemia, unspecified 09/03/2013   Cataract 12/09/2010   Other abnormal glucose 12/09/2010   Mitral valve disorders(424.0) 12/09/2010   Hereditary progressive muscular dystrophy ([place]) 12/09/2010   Benign essential hypertension 12/09/2010   Mitral valve prolapse 12/09/2010   Nonrheumatic mitral valve regurgitation 12/09/2010   Essential hypertension 02/10/2008   S/P cardiac catheterization 06/18/2006    PCP: Jodi Marble, MD  REFERRING PROVIDER: Mcarthur Rossetti, MD  REFERRING DIAG: S/p L total knee replacement  THERAPY  DIAG:  Unilateral primary osteoarthritis, left knee  Status post total left knee replacement  Joint stiffness of knee, left  Muscle weakness (generalized)  Gait difficulty  Rationale for Evaluation and Treatment Rehabilitation  ONSET DATE: 12/31/21  (surgery date)  SUBJECTIVE:   SUBJECTIVE STATEMENT:  02/24/22 Pt. Reports minimal (1/10) L knee joint soreness, not pain.  Pt. Reports Dr. Ninfa Linden is happy with pts. Progress.  Pt. Interested in going to [employer] for ex. Class.  Pt. Reports [facility] is having issues with pool temp. So pool is currently closed.   PERTINENT HISTORY: Pt. Reports h/o L shoulder/R knee arthritis.  Pt. Lives alone in 1 story house.  Pt. Independent with driving prior to TKA.  Pt. Enjoys quilting and wants to return to gym.    PAIN:  Are you having pain? Yes: NPRS scale: 0-1/10 Pain location: L knee Pain description: sore/ aching Aggravating factors: bending Relieving factors: rest  PRECAUTIONS: None  WEIGHT BEARING RESTRICTIONS No  FALLS:  Has patient fallen in last 6 months? No  LIVING ENVIRONMENT: Lives with: lives with their family and lives alone Lives in: House/apartment Stairs: Yes: External: 5 steps; on left going up Has following equipment at home: Single point cane  OCCUPATION: retired  PLOF: Independent  PATIENT GOALS Increase L knee ROM/ strengthening and return to gym/ aquatic ex.    OBJECTIVE:   DIAGNOSTIC FINDINGS: PORTABLE LEFT KNEE - 1-2 VIEW   COMPARISON:  Portable exam 1415 hours compared to 10/23/2021   FINDINGS: Components of LEFT knee prosthesis identified in expected positions.   No acute fracture, dislocation, or bone destruction.   IMPRESSION: LEFT knee prosthesis without acute complication.     Electronically Signed   By: Lavonia Dana M.D.   On: 12/31/2021 14:37  PATIENT SURVEYS:  FOTO initial 40/ goal 52   9/13: 67 (goal met)  COGNITION:  Overall cognitive status: Within functional  limits for tasks assessed     SENSATION: WFL  EDEMA:  Circumferential: L/R joint line (45.5/ 39.5 cm.), distal quad (48.5/42.5 cm), mid-calf (38/ 34.5 cm)  POSTURE: rounded shoulders and forward head  PALPATION: Tenderness around L knee joint/ patella  LOWER EXTREMITY ROM:  Active ROM Right eval Left eval  Hip flexion [facility] [address] [facility]  Hip extension    Hip abduction    Hip adduction    Hip internal rotation    Hip external rotation    Knee flexion 137 deg. 99 deg.  Knee extension 0 deg. -4 deg.  Ankle dorsiflexion    Ankle plantarflexion    Ankle inversion    Ankle eversion     (Blank rows = not tested)  LOWER EXTREMITY MMT:  B LE muscle strength grossly 4+/5 MMT except L knee ext./ flexion 4/5 MMT.    GAIT: Distance walked: in clinic Assistive device utilized: Single point cane Level of assistance:  Modified independence Comments: Pt. Has been using SPC for a week.  2-point gait pattern.    TODAY'S TREATMENT:     02/24/22:     Nustep L5 10 min. Seat #10-9 with B UE/LE.       Walking in clinic/hallway with consistent recip. Gait pattern and good upright posture (no issues reported).  Added recip. [place]one taps/ 6" hurdles step overs with SBA.       Recip. Stairs (ascending/ descending) 6 steps x 5.  Minimal to no UE assist on handrails.     Nautilus: resisted gait 70# forward/ backwards and 50# L/R lateral walking 5x each.  SBA for safety/ cuing.  No LOB.       STS from blue mat with no UE assist (good L knee control)- discussed HEP.       No supine ex./ discussed returning to pool ex.    PATIENT EDUCATION:  Education details: Reviewed HEP Person educated: Patient Education method: Customer service manager Education comprehension: verbalized understanding and returned demonstration  HOME EXERCISE PROGRAM: Standing hip ex./ walking.     ASSESSMENT:  CLINICAL IMPRESSION: Pt. Has shown excellent progress towards all PT goals.  L knee AROM (0-129 deg.) in  supine/seated position.  Pt. Has L LE muscle fatigue/ weakness with increase activity. Pt. Will continue with an independent ex. Program and join [employer] to maintain strength/ functional gains.  Discharge from PT at this time and pt. Instructed to contact PT if any questions or issues.    OBJECTIVE IMPAIRMENTS Abnormal gait, decreased activity tolerance, decreased balance, decreased endurance, decreased mobility, difficulty walking, decreased ROM, decreased strength, hypomobility, increased edema, impaired flexibility, and pain.   ACTIVITY LIMITATIONS carrying, lifting, standing, squatting, stairs, transfers, toileting, dressing, and locomotion level  PARTICIPATION LIMITATIONS: meal prep, cleaning, laundry, medication management, driving, shopping, and community activity  PERSONAL FACTORS Age, Fitness, and Past/current experiences are also affecting patient's functional outcome.   REHAB POTENTIAL: Good  CLINICAL DECISION MAKING: Stable/uncomplicated  EVALUATION COMPLEXITY: Low   GOALS: Goals reviewed with patient? Yes  SHORT TERM GOALS: Target date: 02/13/22 Pt. Independent with HEP to increase L knee AROM (0 to 115 deg.) to improve pain-free mobility.   Baseline:  -4 to 99 deg.  01/23/22: 0-116 deg. 9/11: 0-129 deg. Goal status: Met   LONG TERM GOALS: Target date: 03/13/22  Pt. Will increase FOTO to 52 to improve pain-free mobility.   Baseline: initial FOTO 40.  9/13: 67 Goal status: Goal met  2.  Pt. Able to ascend/ descend stairs with recip. Pattern and no handrail safely.   Baseline:  Difficulty descending stairs (step to pattern).  9/13: recip. Stairs with no issues Goal status: Goal met  3.  Pt. Will ambulates community distances with recip pattern and no assistive device safely.   Baseline:  limited distances/ SPC use Goal status: Goal met   PLAN: PT FREQUENCY: 2x/week  PT DURATION: 8 weeks  PLANNED INTERVENTIONS: Therapeutic exercises, Therapeutic  activity, Neuromuscular re-education, Balance training, Gait training, Patient/Family education, Self Care, Joint mobilization, Aquatic Therapy, Cryotherapy, scar mobilization, and Manual therapy  PLAN FOR NEXT SESSION:  Discharge   Pura Spice, PT, DPT # [phone] 02/24/2022, 7:35 PM

## 2022-02-25 ENCOUNTER — Encounter: Payer: Medicare PPO | Admitting: Physical Therapy

## 2022-02-26 ENCOUNTER — Encounter: Payer: Medicare PPO | Admitting: Physical Therapy

## 2022-02-27 ENCOUNTER — Encounter: Payer: Medicare PPO | Admitting: Physical Therapy

## 2022-02-28 DIAGNOSIS — K219 Gastro-esophageal reflux disease without esophagitis: Secondary | ICD-10-CM | POA: Diagnosis not present

## 2022-02-28 DIAGNOSIS — M5430 Sciatica, unspecified side: Secondary | ICD-10-CM | POA: Diagnosis not present

## 2022-02-28 DIAGNOSIS — M5412 Radiculopathy, cervical region: Secondary | ICD-10-CM | POA: Diagnosis not present

## 2022-02-28 DIAGNOSIS — G4709 Other insomnia: Secondary | ICD-10-CM | POA: Diagnosis not present

## 2022-02-28 DIAGNOSIS — E559 Vitamin D deficiency, unspecified: Secondary | ICD-10-CM | POA: Diagnosis not present

## 2022-02-28 DIAGNOSIS — E785 Hyperlipidemia, unspecified: Secondary | ICD-10-CM | POA: Diagnosis not present

## 2022-02-28 DIAGNOSIS — F411 Generalized anxiety disorder: Secondary | ICD-10-CM | POA: Diagnosis not present

## 2022-02-28 DIAGNOSIS — J301 Allergic rhinitis due to pollen: Secondary | ICD-10-CM | POA: Diagnosis not present

## 2022-02-28 DIAGNOSIS — I10 Essential (primary) hypertension: Secondary | ICD-10-CM | POA: Diagnosis not present

## 2022-03-03 ENCOUNTER — Encounter: Payer: Medicare PPO | Admitting: Physical Therapy

## 2022-03-04 ENCOUNTER — Encounter: Payer: Medicare PPO | Admitting: Physical Therapy

## 2022-03-04 ENCOUNTER — Telehealth: Payer: Self-pay | Admitting: *Deleted

## 2022-03-04 NOTE — Telephone Encounter (Signed)
Call to patient to check status and she states she is doing very well (S/P Left TKR on 12/31/21). She has completed OPPT and has noticed a knot that is in the soft tissue at the back of the knee. It doesn't hurt to touch, but is about the size of a small plum or tomato she states. I told her I'd pass this along to you, but unsure what to tell her. Thanks.

## 2022-03-06 ENCOUNTER — Encounter: Payer: Medicare PPO | Admitting: Physical Therapy

## 2022-03-11 ENCOUNTER — Encounter: Payer: Medicare PPO | Admitting: Physical Therapy

## 2022-03-13 ENCOUNTER — Encounter: Payer: Medicare PPO | Admitting: Physical Therapy

## 2022-03-13 ENCOUNTER — Ambulatory Visit
Admission: RE | Admit: 2022-03-13 | Discharge: 2022-03-13 | Disposition: A | Discharge status: Home / Self Care | Payer: Medicare PPO | Source: Ambulatory Visit | Attending: Internal Medicine | Admitting: Internal Medicine

## 2022-03-13 DIAGNOSIS — Z1231 Encounter for screening mammogram for malignant neoplasm of breast: Secondary | ICD-10-CM | POA: Diagnosis not present

## 2022-03-20 ENCOUNTER — Encounter: Payer: Self-pay | Admitting: Physician Assistant

## 2022-03-20 ENCOUNTER — Ambulatory Visit (INDEPENDENT_AMBULATORY_CARE_PROVIDER_SITE_OTHER): Payer: Medicare PPO | Admitting: Physician Assistant

## 2022-03-20 ENCOUNTER — Ambulatory Visit (INDEPENDENT_AMBULATORY_CARE_PROVIDER_SITE_OTHER): Payer: Medicare PPO

## 2022-03-20 DIAGNOSIS — M7062 Trochanteric bursitis, left hip: Secondary | ICD-10-CM | POA: Diagnosis not present

## 2022-03-20 DIAGNOSIS — Z96652 Presence of left artificial knee joint: Secondary | ICD-10-CM | POA: Diagnosis not present

## 2022-03-20 MED ORDER — METHYLPREDNISOLONE ACETATE 40 MG/ML IJ SUSP
40.0000 mg | INTRAMUSCULAR | Status: AC | PRN
  Administered 2022-03-20: 40 mg via INTRA_ARTICULAR

## 2022-03-20 MED ORDER — LIDOCAINE HCL 1 % IJ SOLN
3.0000 mL | INTRAMUSCULAR | Status: AC | PRN
  Administered 2022-03-20: 3 mL

## 2022-03-20 NOTE — Progress Notes (Signed)
Office Visit Note   Patient: Diana Sullivan           Date of Birth: 1935/10/31           MRN: [id number] Visit Date: 03/20/2022              Requested by: Diana Marble, MD [place],  [place] [id number] PCP: Diana Marble, MD   Assessment & Plan: Visit Diagnoses:  1. Status post total left knee replacement   2. Trochanteric bursitis, left hip     Plan: We will see her back in 2 weeks to see what type of response she had to the truck injection.  She will also continue work on quad strengthening left knee.  She shown IT band stretching exercises.  Questions were encouraged and answered at length.  Follow-Up Instructions: Return in about 2 weeks (around 04/03/2022).   Orders:  Orders Placed This Encounter  Procedures   XR Knee 1-2 Views Left   No orders of the defined types were placed in this encounter.     Procedures: Large Joint Inj: L greater trochanter on 03/20/2022 5:39 PM Indications: pain Details: 22 G 1.5 in needle, lateral approach  Arthrogram: No  Medications: 3 mL lidocaine 1 %; 40 mg methylPREDNISolone acetate 40 MG/ML Outcome: tolerated well, no immediate complications Procedure, treatment alternatives, risks and benefits explained, specific risks discussed. Consent was given by the patient. Immediately prior to procedure a time out was called to verify the correct patient, procedure, equipment, support staff and site/side marked as required. Patient was prepped and draped in the usual sterile fashion.       Clinical Data: No additional findings.   Subjective: Chief Complaint  Patient presents with   Left Knee - Pain    HPI Diana Sullivan comes in today status post left total knee arthroplasty 12/31/2021.  She states she has recently started having symptoms in station of like something slipping or sliding in the left knee.  She is having pain involving the left leg.  She feels as if the hip wants to give way.  She has had no known  injury.  Denies any fevers chills.  Notes swelling in the left leg about the knee.  But no abnormal warmth. Review of Systems See HPI  Objective: Vital Signs: There were no vitals taken for this visit.  Physical Exam General: Well-developed well-nourished female no acute distress mood and affect appropriate Vascular: Left calf supple nontender.  Ortho Exam Left knee good range of motion of the left knee without pain.  No instability valgus varus stressing.  Slight effusion no abnormal warmth erythema.  Surgical incisions well-healed. Left hip: Fluid range of motion pain with external rotation and tenderness over the left trochanteric region. Specialty Comments:  No specialty comments available.  Imaging: XR Knee 1-2 Views Left  Result Date: 03/20/2022 Left knee: Knee is well located.  Status post left total knee arthroplasty with well-seated components.  There is what appears to be cement posterior to the polyliner.  No other acute findings are bony abnormalities.    PMFS History: Patient Active Problem List   Diagnosis Date Noted   Unilateral primary osteoarthritis, left knee 12/31/2021   OA (osteoarthritis) of knee 12/31/2021   Status post total left knee replacement 12/31/2021   Ganglion cyst 10/19/2019   Cervical radiculitis 09/30/2019   Neck pain 09/30/2019   Primary osteoarthritis of both hands 09/21/2019   Swelling of right ring finger 09/21/2019  Nondisplaced fracture of proximal phalanx of left lesser toe(s), initial encounter for open fracture 01/03/2019   Obstructive sleep apnea syndrome 12/14/2017   Generalized weakness 07/28/2017   Snoring 07/28/2017   Chronic right hip pain 10/01/2016   Generalized osteoarthritis of hand 10/01/2016   Trigger ring finger of left hand 10/01/2016   Localized, primary osteoarthritis of the ankle and foot, left 05/15/2014   Chest pain on exertion 01/30/2014   Primary localized osteoarthrosis of ankle and foot 11/16/2013   Statin  intolerance 09/03/2013   Hyperlipidemia, unspecified 09/03/2013   Cataract 12/09/2010   Other abnormal glucose 12/09/2010   Mitral valve disorders(424.0) 12/09/2010   Hereditary progressive muscular dystrophy ([place]) 12/09/2010   Benign essential hypertension 12/09/2010   Mitral valve prolapse 12/09/2010   Nonrheumatic mitral valve regurgitation 12/09/2010   Essential hypertension 02/10/2008   S/P cardiac catheterization 06/18/2006   Past Medical History:  Diagnosis Date   Anginal pain ([place])    chest pain occasionally, checked out [facility] (no Issues)   Arthritis    hands   Bronchitis    Complication of anesthesia    patient stated"They had trouble waking me up"   Elevated lipids    Gout    Headache    once every 2 or 3 weeks   History of recent fall    shoulder and knee pain, left side   Hypertension    essential hypertension-controlled on meds   Mitral valve disorder    Muscular dystrophy ([place])    Patient stated someone told her 25 years ago "that she had the gene for it" No issues   MVP (mitral valve prolapse)    Neuropathy    Pre-diabetes    watching diet   Wears partial dentures    upper and lower    Family History  Problem Relation Age of Onset   Breast cancer Neg Hx    Neuropathy Neg Hx     Past Surgical History:  Procedure Laterality Date   ABDOMINAL HYSTERECTOMY     adenectomy     BACK SURGERY     BREAST SURGERY Right    biopsy, lumpectomy   BUNIONECTOMY Bilateral    CARDIAC CATHETERIZATION     no issue   CARPAL TUNNEL RELEASE Right    dr Fredna Dow   CATARACT EXTRACTION Left 12/06/2012   CATARACT EXTRACTION W/PHACO Right 11/14/2019   Procedure: CATARACT EXTRACTION PHACO AND INTRAOCULAR LENS PLACEMENT (IOC) RIGHT 4.76  00:44.4;  Surgeon: Eulogio Bear, MD;  Location: [facility];  Service: Ophthalmology;  Laterality: Right;   COLONOSCOPY N/A 04/21/2016   Procedure: COLONOSCOPY;  Surgeon: Manya Silvas, MD;  Location: [facility]  ENDOSCOPY;  Service: Endoscopy;  Laterality: N/A;  Diabetic   COLONOSCOPY N/A 06/05/2021   Procedure: COLONOSCOPY;  Surgeon: Toledo, Benay Pike, MD;  Location: [facility] ENDOSCOPY;  Service: Gastroenterology;  Laterality: N/A;   EYE SURGERY     metatarsal osteotomy Left 03/18/2013   TONSILLECTOMY     TOTAL KNEE ARTHROPLASTY Left 12/31/2021   Procedure: LEFT TOTAL KNEE ARTHROPLASTY;  Surgeon: Mcarthur Rossetti, MD;  Location: [facility];  Service: Orthopedics;  Laterality: Left;   TYMPANOSTOMY TUBE PLACEMENT     Social History   Occupational History   Not on file  Tobacco Use   Smoking status: Former    Packs/day: 0.25    Years: 27.00    Total pack years: 6.75    Types: Cigarettes    Quit date: 04/19/1994    Years since  quitting: 27.9   Smokeless tobacco: Never   Tobacco comments:    QUIT 25 YEARS AGO  Vaping Use   Vaping Use: Never used  Substance and Sexual Activity   Alcohol use: Yes    Comment: SOCIAL   Drug use: Never   Sexual activity: Not on file

## 2022-04-03 ENCOUNTER — Ambulatory Visit: Payer: Medicare PPO | Admitting: Orthopaedic Surgery

## 2022-04-23 DIAGNOSIS — I10 Essential (primary) hypertension: Secondary | ICD-10-CM | POA: Diagnosis not present

## 2022-04-23 DIAGNOSIS — R0683 Snoring: Secondary | ICD-10-CM | POA: Diagnosis not present

## 2022-04-23 DIAGNOSIS — Z789 Other specified health status: Secondary | ICD-10-CM | POA: Diagnosis not present

## 2022-04-23 DIAGNOSIS — I340 Nonrheumatic mitral (valve) insufficiency: Secondary | ICD-10-CM | POA: Diagnosis not present

## 2022-04-23 DIAGNOSIS — Z9889 Other specified postprocedural states: Secondary | ICD-10-CM | POA: Diagnosis not present

## 2022-04-23 DIAGNOSIS — Z23 Encounter for immunization: Secondary | ICD-10-CM | POA: Diagnosis not present

## 2022-04-23 DIAGNOSIS — E785 Hyperlipidemia, unspecified: Secondary | ICD-10-CM | POA: Diagnosis not present

## 2022-04-23 DIAGNOSIS — I341 Nonrheumatic mitral (valve) prolapse: Secondary | ICD-10-CM | POA: Diagnosis not present

## 2022-05-06 DIAGNOSIS — E749 Disorder of carbohydrate metabolism, unspecified: Secondary | ICD-10-CM | POA: Diagnosis not present

## 2022-05-06 DIAGNOSIS — E559 Vitamin D deficiency, unspecified: Secondary | ICD-10-CM | POA: Diagnosis not present

## 2022-05-06 DIAGNOSIS — I10 Essential (primary) hypertension: Secondary | ICD-10-CM | POA: Diagnosis not present

## 2022-05-06 DIAGNOSIS — E785 Hyperlipidemia, unspecified: Secondary | ICD-10-CM | POA: Diagnosis not present

## 2022-05-09 DIAGNOSIS — K219 Gastro-esophageal reflux disease without esophagitis: Secondary | ICD-10-CM | POA: Diagnosis not present

## 2022-05-09 DIAGNOSIS — E559 Vitamin D deficiency, unspecified: Secondary | ICD-10-CM | POA: Diagnosis not present

## 2022-05-09 DIAGNOSIS — I10 Essential (primary) hypertension: Secondary | ICD-10-CM | POA: Diagnosis not present

## 2022-05-09 DIAGNOSIS — J301 Allergic rhinitis due to pollen: Secondary | ICD-10-CM | POA: Diagnosis not present

## 2022-05-09 DIAGNOSIS — Z0001 Encounter for general adult medical examination with abnormal findings: Secondary | ICD-10-CM | POA: Diagnosis not present

## 2022-05-09 DIAGNOSIS — F411 Generalized anxiety disorder: Secondary | ICD-10-CM | POA: Diagnosis not present

## 2022-05-09 DIAGNOSIS — E785 Hyperlipidemia, unspecified: Secondary | ICD-10-CM | POA: Diagnosis not present

## 2022-05-09 DIAGNOSIS — M5430 Sciatica, unspecified side: Secondary | ICD-10-CM | POA: Diagnosis not present

## 2022-05-09 DIAGNOSIS — Z1331 Encounter for screening for depression: Secondary | ICD-10-CM | POA: Diagnosis not present

## 2022-05-19 DIAGNOSIS — G4709 Other insomnia: Secondary | ICD-10-CM | POA: Diagnosis not present

## 2022-05-19 DIAGNOSIS — M5412 Radiculopathy, cervical region: Secondary | ICD-10-CM | POA: Diagnosis not present

## 2022-05-19 DIAGNOSIS — M5430 Sciatica, unspecified side: Secondary | ICD-10-CM | POA: Diagnosis not present

## 2022-05-19 DIAGNOSIS — J301 Allergic rhinitis due to pollen: Secondary | ICD-10-CM | POA: Diagnosis not present

## 2022-05-19 DIAGNOSIS — K219 Gastro-esophageal reflux disease without esophagitis: Secondary | ICD-10-CM | POA: Diagnosis not present

## 2022-05-19 DIAGNOSIS — E785 Hyperlipidemia, unspecified: Secondary | ICD-10-CM | POA: Diagnosis not present

## 2022-05-19 DIAGNOSIS — I10 Essential (primary) hypertension: Secondary | ICD-10-CM | POA: Diagnosis not present

## 2022-05-19 DIAGNOSIS — E559 Vitamin D deficiency, unspecified: Secondary | ICD-10-CM | POA: Diagnosis not present

## 2022-05-19 DIAGNOSIS — F411 Generalized anxiety disorder: Secondary | ICD-10-CM | POA: Diagnosis not present

## 2022-06-06 ENCOUNTER — Telehealth: Payer: Self-pay | Admitting: *Deleted

## 2022-06-06 NOTE — Telephone Encounter (Signed)
Ortho bundle CM needs completed.

## 2022-07-21 DIAGNOSIS — G7109 Other specified muscular dystrophies: Secondary | ICD-10-CM | POA: Diagnosis not present

## 2022-07-21 DIAGNOSIS — F411 Generalized anxiety disorder: Secondary | ICD-10-CM | POA: Diagnosis not present

## 2022-07-21 DIAGNOSIS — E538 Deficiency of other specified B group vitamins: Secondary | ICD-10-CM | POA: Diagnosis not present

## 2022-07-21 DIAGNOSIS — G3184 Mild cognitive impairment, so stated: Secondary | ICD-10-CM | POA: Diagnosis not present

## 2022-07-21 DIAGNOSIS — M5481 Occipital neuralgia: Secondary | ICD-10-CM | POA: Diagnosis not present

## 2022-07-25 ENCOUNTER — Other Ambulatory Visit: Payer: Self-pay

## 2022-07-25 DIAGNOSIS — G3184 Mild cognitive impairment, so stated: Secondary | ICD-10-CM

## 2022-07-26 LAB — AMB RESULTS CONSOLE CBG: Glucose: 128

## 2022-07-26 NOTE — Progress Notes (Signed)
Pt is not fasting

## 2022-07-30 ENCOUNTER — Ambulatory Visit
Admission: RE | Admit: 2022-07-30 | Discharge: 2022-07-30 | Disposition: A | Discharge status: Home / Self Care | Payer: Medicare PPO | Source: Ambulatory Visit | Attending: Neurology | Admitting: Neurology

## 2022-07-30 DIAGNOSIS — G3184 Mild cognitive impairment, so stated: Secondary | ICD-10-CM | POA: Diagnosis not present

## 2022-07-30 MED ORDER — GADOBUTROL 1 MMOL/ML IV SOLN
7.5000 mL | Freq: Once | INTRAVENOUS | Status: AC | PRN
  Administered 2022-07-30: 7.5 mL via INTRAVENOUS

## 2022-08-04 ENCOUNTER — Ambulatory Visit: Payer: Medicare PPO | Admitting: Speech Pathology

## 2022-08-04 ENCOUNTER — Encounter: Payer: Self-pay | Admitting: *Deleted

## 2022-08-04 NOTE — Progress Notes (Signed)
Pt has had numerous specialist appt over the past rolling 12 months and has an upcoming PCP appt on 08/11/22. Pt's b/p at 07/26/22 screening event was 140/88 and cardiology has been monitoring her b/p frequently, as has neurologist office. Current specialist providers have also documented SDOH status. No additional health equity team support indicated at this time.

## 2022-08-06 ENCOUNTER — Ambulatory Visit: Payer: Medicare PPO | Admitting: Speech Pathology

## 2022-08-07 ENCOUNTER — Other Ambulatory Visit: Payer: Self-pay | Admitting: Internal Medicine

## 2022-08-07 ENCOUNTER — Other Ambulatory Visit: Payer: Medicare PPO

## 2022-08-07 DIAGNOSIS — E559 Vitamin D deficiency, unspecified: Secondary | ICD-10-CM | POA: Diagnosis not present

## 2022-08-07 DIAGNOSIS — E749 Disorder of carbohydrate metabolism, unspecified: Secondary | ICD-10-CM | POA: Diagnosis not present

## 2022-08-07 DIAGNOSIS — E785 Hyperlipidemia, unspecified: Secondary | ICD-10-CM | POA: Diagnosis not present

## 2022-08-08 LAB — LIPID PANEL W/O CHOL/HDL RATIO
Cholesterol, Total: 188 mg/dL (ref 100–199)
HDL: 69 mg/dL (ref >39)
LDL Chol Calc (NIH): 107 mg/dL — ABNORMAL HIGH (ref 0–99)
Triglycerides: 66 mg/dL (ref 0–149)
VLDL Cholesterol Cal: 12 mg/dL (ref 5–40)

## 2022-08-08 LAB — COMPREHENSIVE METABOLIC PANEL
ALT: 10 IU/L (ref 0–32)
AST: 19 IU/L (ref 0–40)
Albumin/Globulin Ratio: 1.7 (ref 1.2–2.2)
Albumin: 4.3 g/dL (ref 3.7–4.7)
Alkaline Phosphatase: 103 IU/L (ref 44–121)
BUN/Creatinine Ratio: 20 (ref 12–28)
BUN: 19 mg/dL (ref 8–27)
Bilirubin Total: 0.3 mg/dL (ref 0.0–1.2)
CO2: 21 mmol/L (ref 20–29)
Calcium: 9.5 mg/dL (ref 8.7–10.3)
Chloride: 104 mmol/L (ref 96–106)
Creatinine, Ser: 0.97 mg/dL (ref 0.57–1.00)
Globulin, Total: 2.5 g/dL (ref 1.5–4.5)
Glucose: 97 mg/dL (ref 70–99)
Potassium: 4.5 mmol/L (ref 3.5–5.2)
Sodium: 139 mmol/L (ref 134–144)
Total Protein: 6.8 g/dL (ref 6.0–8.5)
eGFR: 58 mL/min/{1.73_m2} — ABNORMAL LOW (ref >59)

## 2022-08-08 LAB — VITAMIN D 25 HYDROXY (VIT D DEFICIENCY, FRACTURES): Vit D, 25-Hydroxy: 48.5 ng/mL (ref 30.0–100.0)

## 2022-08-08 LAB — HGB A1C W/O EAG: Hgb A1c MFr Bld: 6.1 % — ABNORMAL HIGH (ref 4.8–5.6)

## 2022-08-11 ENCOUNTER — Encounter: Payer: Self-pay | Admitting: Internal Medicine

## 2022-08-11 ENCOUNTER — Ambulatory Visit: Payer: Medicare PPO | Admitting: Internal Medicine

## 2022-08-11 VITALS — BP 130/80 | HR 79 | Ht 67.75 in | Wt 164.6 lb

## 2022-08-11 DIAGNOSIS — R7303 Prediabetes: Secondary | ICD-10-CM | POA: Diagnosis not present

## 2022-08-11 DIAGNOSIS — G3184 Mild cognitive impairment, so stated: Secondary | ICD-10-CM | POA: Diagnosis not present

## 2022-08-11 DIAGNOSIS — E119 Type 2 diabetes mellitus without complications: Secondary | ICD-10-CM

## 2022-08-11 DIAGNOSIS — E559 Vitamin D deficiency, unspecified: Secondary | ICD-10-CM | POA: Diagnosis not present

## 2022-08-11 DIAGNOSIS — N1831 Chronic kidney disease, stage 3a: Secondary | ICD-10-CM

## 2022-08-11 DIAGNOSIS — E7800 Pure hypercholesterolemia, unspecified: Secondary | ICD-10-CM

## 2022-08-11 DIAGNOSIS — F419 Anxiety disorder, unspecified: Secondary | ICD-10-CM | POA: Diagnosis not present

## 2022-08-11 DIAGNOSIS — Z789 Other specified health status: Secondary | ICD-10-CM | POA: Diagnosis not present

## 2022-08-11 DIAGNOSIS — I10 Essential (primary) hypertension: Secondary | ICD-10-CM

## 2022-08-11 LAB — GLUCOSE, POCT (MANUAL RESULT ENTRY): POC Glucose: 133 mg/dl — AB (ref 70–99)

## 2022-08-11 MED ORDER — FEXOFENADINE HCL 180 MG PO TABS: 180.0000 mg | ORAL_TABLET | Freq: Every day | ORAL | 0 refills | Status: DC

## 2022-08-11 MED ORDER — VITAMIN D 25 MCG (1000 UNIT) PO TABS: 1000.0000 [IU] | ORAL_TABLET | Freq: Every day | ORAL | 0 refills | Status: AC

## 2022-08-11 NOTE — Progress Notes (Signed)
Established Patient Office Visit  Subjective:  Patient ID: Diana Sullivan, female    DOB: 20-Jan-1936  Age: 87 y.o. MRN: [id number]  Chief Complaint  Patient presents with   Follow-up    3 month lab results    No new complaints, here for lab review and medication refills. Saw a neurologist for an episode of temporary confusion while driving without LOC. She was prescribed Escitalopram but only took it once. MRI brain was unremarkable and she was discharged from the clinic. Labs reviewed and notable for A1c in the prediabetic range, LDL elevated at 107 with lower gfr.     Past Medical History:  Diagnosis Date   Anginal pain ([place])    chest pain occasionally, checked out [facility] (no Issues)   Arthritis    hands   Bronchitis    Complication of anesthesia    patient stated"They had trouble waking me up"   Elevated lipids    Gout    Headache    once every 2 or 3 weeks   History of recent fall    shoulder and knee pain, left side   Hypertension    essential hypertension-controlled on meds   Mitral valve disorder    Muscular dystrophy ([place])    Patient stated someone told her 25 years ago "that she had the gene for it" No issues   MVP (mitral valve prolapse)    Neuropathy    Pre-diabetes    watching diet   Wears partial dentures    upper and lower    Social History   Socioeconomic History   Marital status: Divorced    Spouse name: Not on file   Number of children: Not on file   Years of education: Not on file   Highest education level: Not on file  Occupational History   Not on file  Tobacco Use   Smoking status: Former    Packs/day: 0.25    Years: 27.00    Total pack years: 6.75    Types: Cigarettes    Quit date: 04/19/1994    Years since quitting: 28.3   Smokeless tobacco: Never   Tobacco comments:    QUIT 25 YEARS AGO  Vaping Use   Vaping Use: Never used  Substance and Sexual Activity   Alcohol use: Yes    Comment: SOCIAL   Drug  use: Never   Sexual activity: Not on file  Other Topics Concern   Not on file  Social History Narrative   Not on file   Social Determinants of Health   Financial Resource Strain: Not on file  Food Insecurity: Not on file  Transportation Needs: Not on file  Physical Activity: Not on file  Stress: Not on file  Social Connections: Not on file  Intimate Partner Violence: Not on file    Family History  Problem Relation Age of Onset   Breast cancer Neg Hx    Neuropathy Neg Hx     Allergies  Allergen Reactions   Azithromycin Rash   Iodinated Contrast Media Itching and Swelling   Shellfish Allergy Itching and Swelling    Review of Systems  All other systems reviewed and are negative.      Objective:   BP 130/80   Pulse 79   Ht 5' 7.75" (1.721 m)   Wt 164 lb 9.6 oz (74.7 kg)   SpO2 97%   BMI 25.21 kg/m   Vitals:   08/11/22 1058  BP: 130/80  Pulse: 79  Height: 5' 7.75" (1.721 m)  Weight: 164 lb 9.6 oz (74.7 kg)  SpO2: 97%  BMI (Calculated): 25.21    Physical Exam Vitals reviewed.  Constitutional:      General: She is not in acute distress. HENT:     Head: Normocephalic.     Nose: Nose normal.     Mouth/Throat:     Mouth: Mucous membranes are moist.  Eyes:     Extraocular Movements: Extraocular movements intact.     Pupils: Pupils are equal, round, and reactive to light.  Cardiovascular:     Rate and Rhythm: Normal rate and regular rhythm.     Heart sounds: No murmur heard. Pulmonary:     Effort: Pulmonary effort is normal.     Breath sounds: No rhonchi or rales.  Abdominal:     General: Abdomen is flat.     Palpations: There is no hepatomegaly, splenomegaly or mass.  Musculoskeletal:        General: Normal range of motion.     Cervical back: Normal range of motion. No tenderness.  Skin:    General: Skin is warm and dry.  Neurological:     General: No focal deficit present.     Mental Status: She is alert and oriented to person, place, and  time.     Cranial Nerves: No cranial nerve deficit.     Motor: No weakness.  Psychiatric:        Mood and Affect: Mood normal.        Behavior: Behavior normal.      Results for orders placed or performed in visit on 08/11/22  POCT Glucose (CBG)  Result Value Ref Range   POC Glucose 133 (A) 70 - 99 mg/dl    Recent Results (from the past 2160 hour(Julis Haubner))  CBG     Status: None   Collection Time: 07/26/22 12:00 AM  Result Value Ref Range   Glucose 128   Comprehensive metabolic panel     Status: Abnormal   Collection Time: 08/07/22 10:42 AM  Result Value Ref Range   Glucose 97 70 - 99 mg/dL   BUN 19 8 - 27 mg/dL   Creatinine, Ser 0.97 0.57 - 1.00 mg/dL   eGFR 58 (L) >59 mL/min/1.73   BUN/Creatinine Ratio 20 12 - 28   Sodium 139 134 - 144 mmol/L   Potassium 4.5 3.5 - 5.2 mmol/L   Chloride 104 96 - 106 mmol/L   CO2 21 20 - 29 mmol/L   Calcium 9.5 8.7 - 10.3 mg/dL   Total Protein 6.8 6.0 - 8.5 g/dL   Albumin 4.3 3.7 - 4.7 g/dL   Globulin, Total 2.5 1.5 - 4.5 g/dL   Albumin/Globulin Ratio 1.7 1.2 - 2.2   Bilirubin Total 0.3 0.0 - 1.2 mg/dL   Alkaline Phosphatase 103 44 - 121 IU/L   AST 19 0 - 40 IU/L   ALT 10 0 - 32 IU/L  Lipid Panel w/o Chol/HDL Ratio     Status: Abnormal   Collection Time: 08/07/22 10:42 AM  Result Value Ref Range   Cholesterol, Total 188 100 - 199 mg/dL   Triglycerides 66 0 - 149 mg/dL   HDL 69 >39 mg/dL   VLDL Cholesterol Cal 12 5 - 40 mg/dL   LDL Chol Calc (NIH) 107 (H) 0 - 99 mg/dL  Hgb A1c w/o eAG     Status: Abnormal   Collection Time: 08/07/22 10:42 AM  Result Value Ref Range   Hgb A1c MFr  Bld 6.1 (H) 4.8 - 5.6 %    Comment:          Prediabetes: 5.7 - 6.4          Diabetes: >6.4          Glycemic control for adults with diabetes: <7.0   VITAMIN D 25 Hydroxy (Vit-D Deficiency, Fractures)     Status: None   Collection Time: 08/07/22 10:42 AM  Result Value Ref Range   Vit D, 25-Hydroxy 48.5 30.0 - 100.0 ng/mL    Comment: Vitamin D deficiency  has been defined by the [place] and an Endocrine Society practice guideline as a level of serum 25-OH vitamin D less than 20 ng/mL (1,2). The Endocrine Society went on to further define vitamin D insufficiency as a level between 21 and 29 ng/mL (2). 1. IOM (Institute of Medicine). 2010. Dietary reference    intakes for calcium and D. [place]: The    [employer]. 2. Holick MF, Binkley [place], Bischoff-Ferrari HA, et al.    Evaluation, treatment, and prevention of vitamin D    deficiency: an Endocrine Society clinical practice    guideline. JCEM. 2011 Jul; 96(7):1911-30.   POCT Glucose (CBG)     Status: Abnormal   Collection Time: 08/11/22 11:07 AM  Result Value Ref Range   POC Glucose 133 (A) 70 - 99 mg/dl      Assessment & Plan:   Problem List Items Addressed This Visit   None Visit Diagnoses     Type 2 diabetes mellitus without complication, without long-term current use of insulin (HCC)    -  Primary   Relevant Orders   POCT Glucose (CBG) (Completed)      1. Type 2 diabetes mellitus without complication, without long-term current use of insulin (HCC) - POCT Glucose (CBG)  2. Benign essential hypertension  3. Statin intolerance  4. Pure hypercholesterolemia  5. Mild cognitive impairment  6. Stage 3a chronic kidney disease ([place])  7. Vitamin D deficiency - cholecalciferol (VITAMIN D3) 25 MCG (1000 UNIT) tablet; Take 1 tablet (1,000 Units total) by mouth daily.  Dispense: 90 tablet; Refill: 0  8. Anxiety    No follow-ups on file.   Total time spent: 30 minutes  Volanda Napoleon, MD  08/11/2022

## 2022-08-14 ENCOUNTER — Encounter: Payer: Self-pay | Admitting: Radiology

## 2022-08-14 ENCOUNTER — Encounter: Payer: Medicare PPO | Admitting: Speech Pathology

## 2022-08-18 ENCOUNTER — Encounter: Payer: Medicare PPO | Admitting: Speech Pathology

## 2022-08-18 ENCOUNTER — Ambulatory Visit: Payer: Medicare PPO | Admitting: Physician Assistant

## 2022-08-18 ENCOUNTER — Other Ambulatory Visit: Payer: Self-pay | Admitting: Internal Medicine

## 2022-08-19 ENCOUNTER — Other Ambulatory Visit: Payer: Self-pay

## 2022-08-19 MED ORDER — ONETOUCH DELICA PLUS LANCING MISC: 100.0000 | Freq: Two times a day (BID) | 3 refills | Status: DC

## 2022-08-21 ENCOUNTER — Encounter: Payer: Medicare PPO | Admitting: Speech Pathology

## 2022-08-25 ENCOUNTER — Encounter: Payer: Medicare PPO | Admitting: Speech Pathology

## 2022-08-28 ENCOUNTER — Encounter: Payer: Medicare PPO | Admitting: Speech Pathology

## 2022-09-01 ENCOUNTER — Encounter: Payer: Medicare PPO | Admitting: Speech Pathology

## 2022-09-04 ENCOUNTER — Encounter: Payer: Medicare PPO | Admitting: Speech Pathology

## 2022-09-08 ENCOUNTER — Encounter: Payer: Medicare PPO | Admitting: Speech Pathology

## 2022-09-11 ENCOUNTER — Encounter: Payer: Medicare PPO | Admitting: Speech Pathology

## 2022-09-11 ENCOUNTER — Encounter: Payer: Self-pay | Admitting: Physician Assistant

## 2022-09-11 ENCOUNTER — Ambulatory Visit (INDEPENDENT_AMBULATORY_CARE_PROVIDER_SITE_OTHER): Payer: Medicare PPO | Admitting: Physician Assistant

## 2022-09-11 ENCOUNTER — Other Ambulatory Visit (INDEPENDENT_AMBULATORY_CARE_PROVIDER_SITE_OTHER): Payer: Medicare PPO

## 2022-09-11 DIAGNOSIS — Z96652 Presence of left artificial knee joint: Secondary | ICD-10-CM

## 2022-09-11 NOTE — Progress Notes (Addendum)
HPI: Diana Sullivan status post left total knee arthroplasty 12/31/2021.  She states the knee overall is doing well she is having some tenderness medial side.  She notes some swelling left leg still.  Feels that it is bigger than the right.  She states everything was going well with her knee until about 6 weeks ago.  No known injury.  She has had no fevers chills.  Review of systems: See HPI otherwise negative  Physical exam: General well-developed well-nourished female who ambulates without any assistive devices nonantalgic gait. Bilateral knees good range of motion of both knees without pain.  No instability valgus varus stressing of either knee.  Left knee surgical incisions well-healed.  Slight edema of the left knee compared to the right but no other edema appreciated throughout the left lower leg.  Left knee tenderness over the pes anserinus region.   Radiographs: Left knee 2 views: Well-seated left total knee components.  No acute fractures.  Knee is well located.  No bony abnormalities otherwise.  Impression: Status post left total knee arthroplasty Pes anserinus bursitis left  Plan: Reassurance was given to the patient that the x-ray showed no acute fractures or loosening of the components.  The swelling that she has about her knee is normal. No radiographs at 1 year postop.  See her back in about 6 months to see how she is doing overall.  Questions encouraged and answered at length.

## 2022-09-15 ENCOUNTER — Encounter: Payer: Medicare PPO | Admitting: Speech Pathology

## 2022-09-18 ENCOUNTER — Encounter: Payer: Medicare PPO | Admitting: Speech Pathology

## 2022-09-23 ENCOUNTER — Encounter: Payer: Self-pay | Admitting: Internal Medicine

## 2022-09-23 ENCOUNTER — Encounter: Payer: Medicare PPO | Admitting: Speech Pathology

## 2022-09-23 ENCOUNTER — Ambulatory Visit: Payer: Medicare PPO | Admitting: Internal Medicine

## 2022-09-23 VITALS — BP 128/62 | HR 69 | Ht 67.0 in | Wt 161.0 lb

## 2022-09-23 DIAGNOSIS — F419 Anxiety disorder, unspecified: Secondary | ICD-10-CM | POA: Diagnosis not present

## 2022-09-23 DIAGNOSIS — E119 Type 2 diabetes mellitus without complications: Secondary | ICD-10-CM | POA: Diagnosis not present

## 2022-09-23 DIAGNOSIS — M109 Gout, unspecified: Secondary | ICD-10-CM | POA: Insufficient documentation

## 2022-09-23 DIAGNOSIS — I10 Essential (primary) hypertension: Secondary | ICD-10-CM

## 2022-09-23 MED ORDER — INDOMETHACIN 50 MG PO CAPS: 50.0000 mg | ORAL_CAPSULE | Freq: Three times a day (TID) | ORAL | 1 refills | Status: DC | PRN

## 2022-09-23 MED ORDER — COLCHICINE 0.6 MG PO TABS: 0.6000 mg | ORAL_TABLET | Freq: Two times a day (BID) | ORAL | 2 refills | Status: DC

## 2022-09-23 NOTE — Progress Notes (Signed)
Established Patient Office Visit  Subjective:  Patient ID: Diana Sullivan, female    DOB: 1935/06/24  Age: 87 y.o. MRN: [id number]  Chief Complaint  Patient presents with   Follow-up    Bilateral feet swelling/pain    Patient comes in with a few days history of painful swelling in her hand joints as well as both her ankles.  Patient reports that she has history of gout and the last flareup was in 2020.  She had some leftover prescriptions of colchicine and indomethacin which she started using and found some relief ,however the prescriptions are old. All the patient's swelling in the joints have reduced but she still has some pain and tenderness in her hands.  Will send in new prescriptions for colchicine and Indocin.  She will get her uric acid level checked with her next blood work.    No other concerns at this time.   Past Medical History:  Diagnosis Date   Anginal pain    chest pain occasionally, checked out [facility] (no Issues)   Arthritis    hands   Bronchitis    Complication of anesthesia    patient stated"They had trouble waking me up"   Elevated lipids    Gout    Headache    once every 2 or 3 weeks   History of recent fall    shoulder and knee pain, left side   Hypertension    essential hypertension-controlled on meds   Mitral valve disorder    Muscular dystrophy    Patient stated someone told her 25 years ago "that she had the gene for it" No issues   MVP (mitral valve prolapse)    Neuropathy    Pre-diabetes    watching diet   Wears partial dentures    upper and lower    Past Surgical History:  Procedure Laterality Date   ABDOMINAL HYSTERECTOMY     adenectomy     BACK SURGERY     BREAST SURGERY Right    biopsy, lumpectomy   BUNIONECTOMY Bilateral    CARDIAC CATHETERIZATION     no issue   CARPAL TUNNEL RELEASE Right    dr Merlyn Lot   CATARACT EXTRACTION Left 12/06/2012   CATARACT EXTRACTION W/PHACO Right 11/14/2019   Procedure:  CATARACT EXTRACTION PHACO AND INTRAOCULAR LENS PLACEMENT (IOC) RIGHT 4.76  00:44.4;  Surgeon: Nevada Crane, MD;  Location: [facility];  Service: Ophthalmology;  Laterality: Right;   COLONOSCOPY N/A 04/21/2016   Procedure: COLONOSCOPY;  Surgeon: Scot Jun, MD;  Location: [facility] END[facility];  Service: Endoscopy;  Laterality: N/A;  Diabetic   COLONOSCOPY N/A 06/05/2021   Procedure: COLONOSCOPY;  Surgeon: Toledo, Boykin Nearing, MD;  Location: [facility] ENDOSCOPY;  Service: Gastroenterology;  Laterality: N/A;   EYE SURGERY     metatarsal osteotomy Left 03/18/2013   TONSILLECTOMY     TOTAL KNEE ARTHROPLASTY Left 12/31/2021   Procedure: LEFT TOTAL KNEE ARTHROPLASTY;  Surgeon: Kathryne Hitch, MD;  Location: [facility] OR;  Service: Orthopedics;  Laterality: Left;   TYMPANOSTOMY TUBE PLACEMENT      Social History   Socioeconomic History   Marital status: Divorced    Spouse name: Not on file   Number of children: Not on file   Years of education: Not on file   Highest education level: Not on file  Occupational History   Not on file  Tobacco Use   Smoking status: Former    Packs/day: 0.25    Years:  27.00    Additional pack years: 0.00    Total pack years: 6.75    Types: Cigarettes    Quit date: 04/19/1994    Years since quitting: 28.4   Smokeless tobacco: Never   Tobacco comments:    QUIT 25 YEARS AGO  Vaping Use   Vaping Use: Never used  Substance and Sexual Activity   Alcohol use: Yes    Comment: SOCIAL   Drug use: Never   Sexual activity: Not on file  Other Topics Concern   Not on file  Social History Narrative   Not on file   Social Determinants of Health   Financial Resource Strain: Not on file  Food Insecurity: Not on file  Transportation Needs: Not on file  Physical Activity: Not on file  Stress: Not on file  Social Connections: Not on file  Intimate Partner Violence: Not on file    Family History  Problem Relation Age of Onset   Breast cancer Neg Hx     Neuropathy Neg Hx     Allergies  Allergen Reactions   Azithromycin Rash   Iodinated Contrast Media Itching and Swelling   Shellfish Allergy Itching and Swelling    Review of Systems  Constitutional:  Negative for chills, diaphoresis, fever and weight loss.  HENT: Negative.    Eyes: Negative.   Respiratory: Negative.    Cardiovascular: Negative.   Gastrointestinal: Negative.   Genitourinary: Negative.   Musculoskeletal:  Positive for joint pain. Negative for back pain, falls, myalgias and neck pain.  Neurological: Negative.   Psychiatric/Behavioral: Negative.         Objective:   BP 128/62   Pulse 69   Ht  (1.702 m)   Wt 161 lb (73 kg)   SpO2 98%   BMI 25.22 kg/m   Vitals:   09/23/22 1005  BP: 128/62  Pulse: 69  Height:  (1.702 m)  Weight: 161 lb (73 kg)  SpO2: 98%  BMI (Calculated): 25.21    Physical Exam Vitals and nursing note reviewed.  HENT:     Head: Normocephalic.  Cardiovascular:     Rate and Rhythm: Normal rate and regular rhythm.  Musculoskeletal:        General: Swelling and tenderness present.     Cervical back: Normal range of motion and neck supple.     Right lower leg: No edema.     Left lower leg: No edema.  Skin:    General: Skin is warm.  Neurological:     General: No focal deficit present.     Mental Status: She is alert and oriented to person, place, and time.  Psychiatric:        Mood and Affect: Mood normal.        Behavior: Behavior normal.      No results found for any visits on 09/23/22.  Recent Results (from the past 2160 hour(s))  CBG     Status: None   Collection Time: 07/26/22 12:00 AM  Result Value Ref Range   Glucose 128   Comprehensive metabolic panel     Status: Abnormal   Collection Time: 08/07/22 10:42 AM  Result Value Ref Range   Glucose 97 70 - 99 mg/dL   BUN 19 8 - 27 mg/dL   Creatinine, Ser 1.61 0.57 - 1.00 mg/dL   eGFR 58 (L) >09 UE/AVW/0.98   BUN/Creatinine Ratio 20 12 - 28   Sodium  139 134 - 144 mmol/L   Potassium 4.5  3.5 - 5.2 mmol/L   Chloride 104 96 - 106 mmol/L   CO2 21 20 - 29 mmol/L   Calcium 9.5 8.7 - 10.3 mg/dL   Total Protein 6.8 6.0 - 8.5 g/dL   Albumin 4.3 3.7 - 4.7 g/dL   Globulin, Total 2.5 1.5 - 4.5 g/dL   Albumin/Globulin Ratio 1.7 1.2 - 2.2   Bilirubin Total 0.3 0.0 - 1.2 mg/dL   Alkaline Phosphatase 103 44 - 121 IU/L   AST 19 0 - 40 IU/L   ALT 10 0 - 32 IU/L  Lipid Panel w/o Chol/HDL Ratio     Status: Abnormal   Collection Time: 08/07/22 10:42 AM  Result Value Ref Range   Cholesterol, Total 188 100 - 199 mg/dL   Triglycerides 66 0 - 149 mg/dL   HDL 69 >69 mg/dL   VLDL Cholesterol Cal 12 5 - 40 mg/dL   LDL Chol Calc (NIH) 629 (H) 0 - 99 mg/dL  Hgb B2W w/o eAG     Status: Abnormal   Collection Time: 08/07/22 10:42 AM  Result Value Ref Range   Hgb A1c MFr Bld 6.1 (H) 4.8 - 5.6 %    Comment:          Prediabetes: 5.7 - 6.4          Diabetes: >6.4          Glycemic control for adults with diabetes: <7.0   VITAMIN D 25 Hydroxy (Vit-D Deficiency, Fractures)     Status: None   Collection Time: 08/07/22 10:42 AM  Result Value Ref Range   Vit D, 25-Hydroxy 48.5 30.0 - 100.0 ng/mL    Comment: Vitamin D deficiency has been defined by the Institute of Medicine and an Endocrine Society practice guideline as a level of serum 25-OH vitamin D less than 20 ng/mL (1,2). The Endocrine Society went on to further define vitamin D insufficiency as a level between 21 and 29 ng/mL (2). 1. IOM (Institute of Medicine). 2010. Dietary reference    intakes for calcium and D. [place]: The    [employer]. 2. Holick MF, Binkley Burgess, Bischoff-Ferrari HA, et al.    Evaluation, treatment, and prevention of vitamin D    deficiency: an Endocrine Society clinical practice    guideline. JCEM. 2011 Jul; 96(7):1911-30.   POCT Glucose (CBG)     Status: Abnormal   Collection Time: 08/11/22 11:07 AM  Result Value Ref Range   POC Glucose 133 (A) 70 - 99  mg/dl      Assessment & Plan:  New prescription sent for colchicine and indomethacin.  Patient advised to take them regularly for the next 1 week and then taper off once symptoms start to resolve.  She will get her uric acid levels checked with the next blood work. Problem List Items Addressed This Visit     Benign essential hypertension   Gouty arthritis - Primary   Relevant Medications   colchicine 0.6 MG tablet   indomethacin (INDOCIN) 50 MG capsule   Type 2 diabetes mellitus without complication, without long-term current use of insulin   Anxiety    Follow up as scheduled.   Total time spent: 20 minutes  Margaretann Loveless, MD  09/23/2022

## 2022-09-25 ENCOUNTER — Encounter: Payer: Medicare PPO | Admitting: Speech Pathology

## 2022-11-04 DIAGNOSIS — M1990 Unspecified osteoarthritis, unspecified site: Secondary | ICD-10-CM | POA: Diagnosis not present

## 2022-11-04 DIAGNOSIS — I10 Essential (primary) hypertension: Secondary | ICD-10-CM | POA: Diagnosis not present

## 2022-11-04 DIAGNOSIS — I340 Nonrheumatic mitral (valve) insufficiency: Secondary | ICD-10-CM | POA: Diagnosis not present

## 2022-11-04 DIAGNOSIS — E785 Hyperlipidemia, unspecified: Secondary | ICD-10-CM | POA: Diagnosis not present

## 2022-11-13 ENCOUNTER — Other Ambulatory Visit: Payer: Medicare PPO

## 2022-11-13 DIAGNOSIS — R7303 Prediabetes: Secondary | ICD-10-CM

## 2022-11-13 DIAGNOSIS — E7800 Pure hypercholesterolemia, unspecified: Secondary | ICD-10-CM

## 2022-11-14 LAB — LIPID PANEL
Chol/HDL Ratio: 2.8 ratio (ref 0.0–4.4)
Cholesterol, Total: 187 mg/dL (ref 100–199)
HDL: 66 mg/dL (ref >39)
LDL Chol Calc (NIH): 109 mg/dL — ABNORMAL HIGH (ref 0–99)
Triglycerides: 65 mg/dL (ref 0–149)
VLDL Cholesterol Cal: 12 mg/dL (ref 5–40)

## 2022-11-14 LAB — COMPREHENSIVE METABOLIC PANEL
ALT: 10 IU/L (ref 0–32)
AST: 20 IU/L (ref 0–40)
Albumin/Globulin Ratio: 1.6 (ref 1.2–2.2)
Albumin: 4.1 g/dL (ref 3.7–4.7)
Alkaline Phosphatase: 118 IU/L (ref 44–121)
BUN/Creatinine Ratio: 21 (ref 12–28)
BUN: 19 mg/dL (ref 8–27)
Bilirubin Total: 0.2 mg/dL (ref 0.0–1.2)
CO2: 22 mmol/L (ref 20–29)
Calcium: 9.3 mg/dL (ref 8.7–10.3)
Chloride: 105 mmol/L (ref 96–106)
Creatinine, Ser: 0.92 mg/dL (ref 0.57–1.00)
Globulin, Total: 2.5 g/dL (ref 1.5–4.5)
Glucose: 90 mg/dL (ref 70–99)
Potassium: 4.6 mmol/L (ref 3.5–5.2)
Sodium: 140 mmol/L (ref 134–144)
Total Protein: 6.6 g/dL (ref 6.0–8.5)
eGFR: 61 mL/min/{1.73_m2} (ref >59)

## 2022-11-14 LAB — HEMOGLOBIN A1C
Est. average glucose Bld gHb Est-mCnc: 131 mg/dL
Hgb A1c MFr Bld: 6.2 % — ABNORMAL HIGH (ref 4.8–5.6)

## 2022-11-17 ENCOUNTER — Ambulatory Visit: Payer: Medicare PPO | Admitting: Internal Medicine

## 2022-11-17 ENCOUNTER — Encounter: Payer: Self-pay | Admitting: Internal Medicine

## 2022-11-17 VITALS — BP 130/78 | HR 68 | Ht 67.75 in | Wt 163.0 lb

## 2022-11-17 DIAGNOSIS — I10 Essential (primary) hypertension: Secondary | ICD-10-CM | POA: Diagnosis not present

## 2022-11-17 DIAGNOSIS — R7303 Prediabetes: Secondary | ICD-10-CM | POA: Diagnosis not present

## 2022-11-17 DIAGNOSIS — J301 Allergic rhinitis due to pollen: Secondary | ICD-10-CM

## 2022-11-17 DIAGNOSIS — E119 Type 2 diabetes mellitus without complications: Secondary | ICD-10-CM | POA: Diagnosis not present

## 2022-11-17 DIAGNOSIS — E7800 Pure hypercholesterolemia, unspecified: Secondary | ICD-10-CM

## 2022-11-17 DIAGNOSIS — R232 Flushing: Secondary | ICD-10-CM

## 2022-11-17 LAB — POCT CBG (FASTING - GLUCOSE)-MANUAL ENTRY: Glucose Fasting, POC: 99 mg/dL (ref 70–99)

## 2022-11-17 MED ORDER — FEXOFENADINE HCL 180 MG PO TABS
180.0000 mg | ORAL_TABLET | Freq: Every day | ORAL | 0 refills | Status: DC
Start: 2022-11-17 — End: 2023-06-01

## 2022-11-17 MED ORDER — ONETOUCH DELICA PLUS LANCING MISC
100.0000 | Freq: Two times a day (BID) | 3 refills | Status: AC
Start: 2022-11-17 — End: ?

## 2022-11-17 MED ORDER — OLMESARTAN MEDOXOMIL-HCTZ 20-12.5 MG PO TABS
1.0000 | ORAL_TABLET | Freq: Every day | ORAL | 0 refills | Status: DC
Start: 2022-11-17 — End: 2023-02-17

## 2022-11-17 MED ORDER — ONETOUCH ULTRA 2 W/DEVICE KIT
1.0000 | PACK | Freq: Once | 0 refills | Status: AC
Start: 2022-11-17 — End: 2022-11-17

## 2022-11-17 MED ORDER — ONETOUCH ULTRASOFT 2 LANCETS MISC
1.0000 | Freq: Two times a day (BID) | 3 refills | Status: AC
Start: 2022-11-17 — End: 2023-11-17

## 2022-11-17 MED ORDER — ONETOUCH ULTRA TEST VI STRP
ORAL_STRIP | 12 refills | Status: AC
Start: 2022-11-17 — End: ?

## 2022-11-17 NOTE — Progress Notes (Signed)
Established Patient Office Visit  Subjective:  Patient ID: Diana Sullivan, female    DOB: [birth year]  Age: 87 y.o. MRN: [id number]  Chief Complaint  Patient presents with   Follow-up    3 month follow up, discuss lab results.    No new complaints except hot flash at specific time every night that wakes her up. Also here for lab review and medication refills. Labs reviewed and notable for A1c in the prediabetic range although slightly higher. LDL still slightly elevated.    No other concerns at this time.   Past Medical History:  Diagnosis Date   Anginal pain (HCC)    chest pain occasionally, checked out [facility] (no Issues)   Arthritis    hands   Bronchitis    Complication of anesthesia    patient stated"They had trouble waking me up"   Elevated lipids    Gout    Headache    once every 2 or 3 weeks   History of recent fall    shoulder and knee pain, left side   Hypertension    essential hypertension-controlled on meds   Mitral valve disorder    Muscular dystrophy (HCC)    Patient stated someone told her 25 years ago "that she had the gene for it" No issues   MVP (mitral valve prolapse)    Neuropathy    Pre-diabetes    watching diet   Wears partial dentures    upper and lower    Past Surgical History:  Procedure Laterality Date   ABDOMINAL HYSTERECTOMY     adenectomy     BACK SURGERY     BREAST SURGERY Right    biopsy, lumpectomy   BUNIONECTOMY Bilateral    CARDIAC CATHETERIZATION     no issue   CARPAL TUNNEL RELEASE Right    dr Merlyn Lot   CATARACT EXTRACTION Left 12/06/2012   CATARACT EXTRACTION W/PHACO Right 11/14/2019   Procedure: CATARACT EXTRACTION PHACO AND INTRAOCULAR LENS PLACEMENT (IOC) RIGHT 4.76  00:44.4;  Surgeon: Nevada Crane, MD;  Location: [facility];  Service: Ophthalmology;  Laterality: Right;   COLONOSCOPY N/A 04/21/2016   Procedure: COLONOSCOPY;  Surgeon: Scot Jun, MD;  Location: [facility] ENDOSCOPY;   Service: Endoscopy;  Laterality: N/A;  Diabetic   COLONOSCOPY N/A 06/05/2021   Procedure: COLONOSCOPY;  Surgeon: Toledo, Boykin Nearing, MD;  Location: [facility] ENDOSCOPY;  Service: Gastroenterology;  Laterality: N/A;   EYE SURGERY     metatarsal osteotomy Left 03/18/2013   TONSILLECTOMY     TOTAL KNEE ARTHROPLASTY Left 12/31/2021   Procedure: LEFT TOTAL KNEE ARTHROPLASTY;  Surgeon: Kathryne Hitch, MD;  Location: [facility] OR;  Service: Orthopedics;  Laterality: Left;   TYMPANOSTOMY TUBE PLACEMENT      Social History   Socioeconomic History   Marital status: Divorced    Spouse name: Not on file   Number of children: Not on file   Years of education: Not on file   Highest education level: Not on file  Occupational History   Not on file  Tobacco Use   Smoking status: Former    Packs/day: 0.25    Years: 27.00    Additional pack years: 0.00    Total pack years: 6.75    Types: Cigarettes    Quit date: 04/19/1994    Years since quitting: 28.6   Smokeless tobacco: Never   Tobacco comments:    QUIT 25 YEARS AGO  Vaping Use   Vaping Use: Never used  Substance and Sexual Activity   Alcohol use: Yes    Comment: SOCIAL   Drug use: Never   Sexual activity: Not on file  Other Topics Concern   Not on file  Social History Narrative   Not on file   Social Determinants of Health   Financial Resource Strain: Not on file  Food Insecurity: Not on file  Transportation Needs: Not on file  Physical Activity: Not on file  Stress: Not on file  Social Connections: Not on file  Intimate Partner Violence: Not on file    Family History  Problem Relation Age of Onset   Breast cancer Neg Hx    Neuropathy Neg Hx     Allergies  Allergen Reactions   Azithromycin Rash   Iodinated Contrast Media Itching and Swelling   Shellfish Allergy Itching and Swelling    Review of Systems  All other systems reviewed and are negative.      Objective:   BP 130/78   Pulse 68   Ht 5' 7.75" (1.721  m)   Wt 163 lb (73.9 kg)   SpO2 97%   BMI 24.97 kg/m   Vitals:   11/17/22 1005  BP: 130/78  Pulse: 68  Height: 5' 7.75" (1.721 m)  Weight: 163 lb (73.9 kg)  SpO2: 97%  BMI (Calculated): 24.96    Physical Exam Vitals reviewed.  Constitutional:      General: She is not in acute distress. HENT:     Head: Normocephalic.     Nose: Nose normal.     Mouth/Throat:     Mouth: Mucous membranes are moist.  Eyes:     Extraocular Movements: Extraocular movements intact.     Pupils: Pupils are equal, round, and reactive to light.  Cardiovascular:     Rate and Rhythm: Normal rate and regular rhythm.     Heart sounds: No murmur heard. Pulmonary:     Effort: Pulmonary effort is normal.     Breath sounds: No rhonchi or rales.  Abdominal:     General: Abdomen is flat.     Palpations: There is no hepatomegaly, splenomegaly or mass.  Musculoskeletal:        General: Normal range of motion.     Cervical back: Normal range of motion. No tenderness.  Skin:    General: Skin is warm and dry.  Neurological:     General: No focal deficit present.     Mental Status: She is alert and oriented to person, place, and time.     Cranial Nerves: No cranial nerve deficit.     Motor: No weakness.  Psychiatric:        Mood and Affect: Mood normal.        Behavior: Behavior normal.      Results for orders placed or performed in visit on 11/17/22  POCT CBG (Fasting - Glucose)  Result Value Ref Range   Glucose Fasting, POC 99 70 - 99 mg/dL    Recent Results (from the past 2160 hour(Ovadia Lopp))  Comprehensive metabolic panel     Status: None   Collection Time: 11/13/22  9:50 AM  Result Value Ref Range   Glucose 90 70 - 99 mg/dL   BUN 19 8 - 27 mg/dL   Creatinine, Ser 1.30 0.57 - 1.00 mg/dL   eGFR 61 >86 VH/QIO/9.62   BUN/Creatinine Ratio 21 12 - 28   Sodium 140 134 - 144 mmol/L   Potassium 4.6 3.5 - 5.2 mmol/L   Chloride 105 96 -  106 mmol/L   CO2 22 20 - 29 mmol/L   Calcium 9.3 8.7 - 10.3 mg/dL    Total Protein 6.6 6.0 - 8.5 g/dL   Albumin 4.1 3.7 - 4.7 g/dL   Globulin, Total 2.5 1.5 - 4.5 g/dL   Albumin/Globulin Ratio 1.6 1.2 - 2.2   Bilirubin Total 0.2 0.0 - 1.2 mg/dL   Alkaline Phosphatase 118 44 - 121 IU/L   AST 20 0 - 40 IU/L   ALT 10 0 - 32 IU/L  Hemoglobin A1c     Status: Abnormal   Collection Time: 11/13/22  9:50 AM  Result Value Ref Range   Hgb A1c MFr Bld 6.2 (H) 4.8 - 5.6 %    Comment:          Prediabetes: 5.7 - 6.4          Diabetes: >6.4          Glycemic control for adults with diabetes: <7.0    Est. average glucose Bld gHb Est-mCnc 131 mg/dL  Lipid panel     Status: Abnormal   Collection Time: 11/13/22  9:50 AM  Result Value Ref Range   Cholesterol, Total 187 100 - 199 mg/dL   Triglycerides 65 0 - 149 mg/dL   HDL 66 >16 mg/dL   VLDL Cholesterol Cal 12 5 - 40 mg/dL   LDL Chol Calc (NIH) 109 (H) 0 - 99 mg/dL   Chol/HDL Ratio 2.8 0.0 - 4.4 ratio    Comment:                                   T. Chol/HDL Ratio                                             Men  Women                               1/2 Avg.Risk  3.4    3.3                                   Avg.Risk  5.0    4.4                                2X Avg.Risk  9.6    7.1                                3X Avg.Risk 23.4   11.0   POCT CBG (Fasting - Glucose)     Status: None   Collection Time: 11/17/22 10:17 AM  Result Value Ref Range   Glucose Fasting, POC 99 70 - 99 mg/dL      Assessment & Plan:   As per problem list  Problem List Items Addressed This Visit       Cardiovascular and Mediastinum   Benign essential hypertension   Relevant Medications   olmesartan-hydrochlorothiazide (BENICAR HCT) 20-12.5 MG tablet   Other Relevant Orders   Comprehensive metabolic panel     Respiratory   Seasonal allergic rhinitis due to pollen  Relevant Medications   fexofenadine (ALLEGRA) 180 MG tablet     Endocrine   Type 2 diabetes mellitus without complication, without long-term current use of  insulin (HCC) - Primary   Relevant Medications   olmesartan-hydrochlorothiazide (BENICAR HCT) 20-12.5 MG tablet   Other Relevant Orders   POCT CBG (Fasting - Glucose) (Completed)   Hemoglobin A1c   Lipid panel   Comprehensive metabolic panel     Other   Hyperlipidemia, unspecified   Relevant Medications   olmesartan-hydrochlorothiazide (BENICAR HCT) 20-12.5 MG tablet   Other Relevant Orders   Lipid panel   Other Visit Diagnoses     Prediabetes       Relevant Medications   glucose blood (ONETOUCH ULTRA TEST) test strip   OneTouch UltraSoft 2 Lancets MISC   Lancet Devices (ONETOUCH DELICA PLUS LANCING) MISC   Blood Glucose Monitoring Suppl (ONE TOUCH ULTRA 2) w/Device KIT   Hot flashes       Relevant Medications   olmesartan-hydrochlorothiazide (BENICAR HCT) 20-12.5 MG tablet   Other Relevant Orders   TSH       Return in about 3 months (around 02/17/2023) for fu with labs prior.   Total time spent: 20 minutes  Luna Fuse, MD  11/17/2022   This document may have been prepared by [facility] Voice Recognition software and as such may include unintentional dictation errors.

## 2022-11-18 DIAGNOSIS — B351 Tinea unguium: Secondary | ICD-10-CM | POA: Diagnosis not present

## 2022-11-18 DIAGNOSIS — M2042 Other hammer toe(s) (acquired), left foot: Secondary | ICD-10-CM | POA: Diagnosis not present

## 2022-11-18 DIAGNOSIS — M2041 Other hammer toe(s) (acquired), right foot: Secondary | ICD-10-CM | POA: Diagnosis not present

## 2022-11-18 DIAGNOSIS — R7303 Prediabetes: Secondary | ICD-10-CM | POA: Diagnosis not present

## 2022-11-21 ENCOUNTER — Other Ambulatory Visit: Payer: Self-pay | Admitting: Internal Medicine

## 2022-11-21 ENCOUNTER — Telehealth: Payer: Self-pay

## 2022-11-21 DIAGNOSIS — M109 Gout, unspecified: Secondary | ICD-10-CM

## 2022-11-21 NOTE — Telephone Encounter (Signed)
Patient is having a gout flare up and is requesting to get something called in for her, she seen Neelam for this while you were oout a couple months ago   Pt cell #[phone]

## 2022-11-21 NOTE — Telephone Encounter (Signed)
Informed via VM

## 2022-12-15 DIAGNOSIS — L218 Other seborrheic dermatitis: Secondary | ICD-10-CM | POA: Diagnosis not present

## 2022-12-26 ENCOUNTER — Ambulatory Visit (INDEPENDENT_AMBULATORY_CARE_PROVIDER_SITE_OTHER): Payer: Medicare PPO | Admitting: Internal Medicine

## 2022-12-26 DIAGNOSIS — R6889 Other general symptoms and signs: Secondary | ICD-10-CM

## 2022-12-26 DIAGNOSIS — J069 Acute upper respiratory infection, unspecified: Secondary | ICD-10-CM

## 2022-12-26 LAB — POCT XPERT XPRESS SARS COVID-2/FLU/RSV
FLU A: NEGATIVE
FLU B: NEGATIVE
RSV RNA, PCR: NEGATIVE
SARS Coronavirus 2: NEGATIVE

## 2022-12-29 NOTE — Progress Notes (Signed)
Spoke with Pt who verbalized understanding.

## 2023-01-05 ENCOUNTER — Encounter: Payer: Self-pay | Admitting: Physician Assistant

## 2023-01-05 ENCOUNTER — Ambulatory Visit: Payer: Medicare PPO | Admitting: Physician Assistant

## 2023-01-05 DIAGNOSIS — Z96652 Presence of left artificial knee joint: Secondary | ICD-10-CM

## 2023-01-05 NOTE — Progress Notes (Signed)
HPI: Diana Sullivan returns today status post left total knee arthroplasty 12/31/2021.  She is overall doing well.  She states she has some soreness and stiffness at times.  Describes her pain is minimal.  She continues to work on range of motion and strengthening.  Her only main concern is some swelling about the leg.  Review of systems: See HPI otherwise negative  Physical exam: General well-developed well-nourished female no acute distress mood and affect appropriate.  Psych alert and oriented x 3.  Ambulates without any assistive device.  Left knee: Full extension full flexion no instability valgus varus stressing.  No abnormal warmth erythema or effusion.  Minimal tenderness over the pes anserinus.   Plan: She will follow-up with [place] as needed.  Questions were encouraged and answered.

## 2023-01-07 ENCOUNTER — Telehealth: Payer: Self-pay | Admitting: *Deleted

## 2023-01-07 NOTE — Telephone Encounter (Signed)
1 year Ortho bundle f/u meeting completed.

## 2023-02-03 DIAGNOSIS — R7303 Prediabetes: Secondary | ICD-10-CM | POA: Diagnosis not present

## 2023-02-03 DIAGNOSIS — G609 Hereditary and idiopathic neuropathy, unspecified: Secondary | ICD-10-CM | POA: Diagnosis not present

## 2023-02-10 DIAGNOSIS — R059 Cough, unspecified: Secondary | ICD-10-CM | POA: Diagnosis not present

## 2023-02-10 DIAGNOSIS — K219 Gastro-esophageal reflux disease without esophagitis: Secondary | ICD-10-CM | POA: Diagnosis not present

## 2023-02-10 DIAGNOSIS — J301 Allergic rhinitis due to pollen: Secondary | ICD-10-CM | POA: Diagnosis not present

## 2023-02-12 ENCOUNTER — Other Ambulatory Visit: Payer: Medicare PPO

## 2023-02-12 DIAGNOSIS — E119 Type 2 diabetes mellitus without complications: Secondary | ICD-10-CM

## 2023-02-12 DIAGNOSIS — I10 Essential (primary) hypertension: Secondary | ICD-10-CM | POA: Diagnosis not present

## 2023-02-12 DIAGNOSIS — E7800 Pure hypercholesterolemia, unspecified: Secondary | ICD-10-CM | POA: Diagnosis not present

## 2023-02-13 LAB — COMPREHENSIVE METABOLIC PANEL WITH GFR
ALT: 11 IU/L (ref 0–32)
AST: 19 IU/L (ref 0–40)
Albumin: 4.2 g/dL (ref 3.7–4.7)
Alkaline Phosphatase: 103 IU/L (ref 44–121)
BUN/Creatinine Ratio: 14 (ref 12–28)
BUN: 13 mg/dL (ref 8–27)
Bilirubin Total: 0.4 mg/dL (ref 0.0–1.2)
CO2: 23 mmol/L (ref 20–29)
Calcium: 9.8 mg/dL (ref 8.7–10.3)
Chloride: 105 mmol/L (ref 96–106)
Creatinine, Ser: 0.92 mg/dL (ref 0.57–1.00)
Globulin, Total: 2.7 g/dL (ref 1.5–4.5)
Glucose: 93 mg/dL (ref 70–99)
Potassium: 5.1 mmol/L (ref 3.5–5.2)
Sodium: 141 mmol/L (ref 134–144)
Total Protein: 6.9 g/dL (ref 6.0–8.5)
eGFR: 61 mL/min/1.73

## 2023-02-13 LAB — LIPID PANEL
Chol/HDL Ratio: 2.9 ratio (ref 0.0–4.4)
Cholesterol, Total: 189 mg/dL (ref 100–199)
HDL: 65 mg/dL
LDL Chol Calc (NIH): 111 mg/dL — ABNORMAL HIGH (ref 0–99)
Triglycerides: 70 mg/dL (ref 0–149)
VLDL Cholesterol Cal: 13 mg/dL (ref 5–40)

## 2023-02-13 LAB — HEMOGLOBIN A1C
Est. average glucose Bld gHb Est-mCnc: 134 mg/dL
Hgb A1c MFr Bld: 6.3 % — ABNORMAL HIGH (ref 4.8–5.6)

## 2023-02-17 ENCOUNTER — Encounter: Payer: Self-pay | Admitting: Internal Medicine

## 2023-02-17 ENCOUNTER — Ambulatory Visit (INDEPENDENT_AMBULATORY_CARE_PROVIDER_SITE_OTHER): Payer: Medicare PPO | Admitting: Internal Medicine

## 2023-02-17 ENCOUNTER — Telehealth: Payer: Self-pay

## 2023-02-17 VITALS — BP 132/62 | HR 70 | Ht 67.0 in | Wt 164.0 lb

## 2023-02-17 DIAGNOSIS — N1831 Chronic kidney disease, stage 3a: Secondary | ICD-10-CM | POA: Diagnosis not present

## 2023-02-17 DIAGNOSIS — I10 Essential (primary) hypertension: Secondary | ICD-10-CM

## 2023-02-17 DIAGNOSIS — E119 Type 2 diabetes mellitus without complications: Secondary | ICD-10-CM

## 2023-02-17 DIAGNOSIS — R7303 Prediabetes: Secondary | ICD-10-CM

## 2023-02-17 DIAGNOSIS — E7800 Pure hypercholesterolemia, unspecified: Secondary | ICD-10-CM | POA: Diagnosis not present

## 2023-02-17 LAB — POCT CBG (FASTING - GLUCOSE)-MANUAL ENTRY: Glucose Fasting, POC: 106 mg/dL — AB (ref 70–99)

## 2023-02-17 MED ORDER — VALSARTAN-HYDROCHLOROTHIAZIDE 160-12.5 MG PO TABS
1.0000 | ORAL_TABLET | Freq: Every day | ORAL | 11 refills | Status: DC
Start: 2023-02-17 — End: 2023-02-17

## 2023-02-17 MED ORDER — VALSARTAN-HYDROCHLOROTHIAZIDE 160-12.5 MG PO TABS
1.0000 | ORAL_TABLET | Freq: Every day | ORAL | 2 refills | Status: DC
Start: 2023-02-17 — End: 2023-05-05

## 2023-02-17 NOTE — Telephone Encounter (Signed)
Pt called stating that the new rx you wanted her to start, states the side effects cause high blood sugar and said she is not going to start that medication if its going to cause issues with that, please advise or please send an alternative rx

## 2023-02-17 NOTE — Progress Notes (Signed)
Established Patient Office Visit  Subjective:  Patient ID: Diana Sullivan, female    DOB: 1935-09-09  Age: 87 y.o. MRN: [id number]  Chief Complaint  Patient presents with   Follow-up    No new complaints, here for lab review and medication refills. Labs reviewed and notable for rise in A1c to 6.3 but still in the prediabetic range. LDL elevated but stable but TC well controlled on lab review. Triglycerides also satisfactory.     No other concerns at this time.   Past Medical History:  Diagnosis Date   Anginal pain (HCC)    chest pain occasionally, checked out [facility] (no Issues)   Arthritis    hands   Bronchitis    Complication of anesthesia    patient stated"They had trouble waking me up"   Elevated lipids    Gout    Headache    once every 2 or 3 weeks   History of recent fall    shoulder and knee pain, left side   Hypertension    essential hypertension-controlled on meds   Mitral valve disorder    Muscular dystrophy (HCC)    Patient stated someone told her 25 years ago "that she had the gene for it" No issues   MVP (mitral valve prolapse)    Neuropathy    Pre-diabetes    watching diet   Wears partial dentures    upper and lower    Past Surgical History:  Procedure Laterality Date   ABDOMINAL HYSTERECTOMY     adenectomy     BACK SURGERY     BREAST SURGERY Right    biopsy, lumpectomy   BUNIONECTOMY Bilateral    CARDIAC CATHETERIZATION     no issue   CARPAL TUNNEL RELEASE Right    dr Merlyn Lot   CATARACT EXTRACTION Left 12/06/2012   CATARACT EXTRACTION W/PHACO Right 11/14/2019   Procedure: CATARACT EXTRACTION PHACO AND INTRAOCULAR LENS PLACEMENT (IOC) RIGHT 4.76  00:44.4;  Surgeon: Nevada Crane, MD;  Location: [facility];  Service: Ophthalmology;  Laterality: Right;   COLONOSCOPY N/A 04/21/2016   Procedure: COLONOSCOPY;  Surgeon: Scot Jun, MD;  Location: [facility] ENDOSCOPY;  Service: Endoscopy;  Laterality: N/A;  Diabetic    COLONOSCOPY N/A 06/05/2021   Procedure: COLONOSCOPY;  Surgeon: Toledo, Boykin Nearing, MD;  Location: [facility] ENDOSCOPY;  Service: Gastroenterology;  Laterality: N/A;   EYE SURGERY     metatarsal osteotomy Left 03/18/2013   TONSILLECTOMY     TOTAL KNEE ARTHROPLASTY Left 12/31/2021   Procedure: LEFT TOTAL KNEE ARTHROPLASTY;  Surgeon: Kathryne Hitch, MD;  Location: [facility] OR;  Service: Orthopedics;  Laterality: Left;   TYMPANOSTOMY TUBE PLACEMENT      Social History   Socioeconomic History   Marital status: Divorced    Spouse name: Not on file   Number of children: Not on file   Years of education: Not on file   Highest education level: Not on file  Occupational History   Not on file  Tobacco Use   Smoking status: Former    Current packs/day: 0.00    Average packs/day: 0.3 packs/day for 27.0 years (6.8 ttl pk-yrs)    Types: Cigarettes    Start date: 04/20/1967    Quit date: 04/19/1994    Years since quitting: 28.8   Smokeless tobacco: Never   Tobacco comments:    QUIT 25 YEARS AGO  Vaping Use   Vaping status: Never Used  Substance and Sexual Activity   Alcohol  use: Yes    Comment: SOCIAL   Drug use: Never   Sexual activity: Not on file  Other Topics Concern   Not on file  Social History Narrative   Not on file   Social Determinants of Health   Financial Resource Strain: Not on file  Food Insecurity: Not on file  Transportation Needs: Not on file  Physical Activity: Not on file  Stress: Not on file  Social Connections: Not on file  Intimate Partner Violence: Not on file    Family History  Problem Relation Age of Onset   Breast cancer Neg Hx    Neuropathy Neg Hx     Allergies  Allergen Reactions   Azithromycin Rash   Iodinated Contrast Media Itching and Swelling   Shellfish Allergy Itching and Swelling    Review of Systems  Respiratory:  Positive for cough.   All other systems reviewed and are negative.      Objective:   BP 132/62   Pulse 70    Ht 5\' 7"  (1.702 m)   Wt 164 lb (74.4 kg)   SpO2 98%   BMI 25.69 kg/m   Vitals:   02/17/23 1010  BP: 132/62  Pulse: 70  Height: 5\' 7"  (1.702 m)  Weight: 164 lb (74.4 kg)  SpO2: 98%  BMI (Calculated): 25.68    Physical Exam Vitals reviewed.  Constitutional:      General: She is not in acute distress. HENT:     Head: Normocephalic.     Nose: Nose normal.     Mouth/Throat:     Mouth: Mucous membranes are moist.  Eyes:     Extraocular Movements: Extraocular movements intact.     Pupils: Pupils are equal, round, and reactive to light.  Cardiovascular:     Rate and Rhythm: Normal rate and regular rhythm.     Heart sounds: No murmur heard. Pulmonary:     Effort: Pulmonary effort is normal.     Breath sounds: No rhonchi or rales.  Abdominal:     General: Abdomen is flat.     Palpations: There is no hepatomegaly, splenomegaly or mass.  Musculoskeletal:        General: Normal range of motion.     Cervical back: Normal range of motion. No tenderness.  Skin:    General: Skin is warm and dry.  Neurological:     General: No focal deficit present.     Mental Status: She is alert and oriented to person, place, and time.     Cranial Nerves: No cranial nerve deficit.     Motor: No weakness.  Psychiatric:        Mood and Affect: Mood normal.        Behavior: Behavior normal.      Results for orders placed or performed in visit on 02/17/23  POCT CBG (Fasting - Glucose)  Result Value Ref Range   Glucose Fasting, POC 106 (A) 70 - 99 mg/dL    Recent Results (from the past 2160 hour(Mikle Sternberg))  POCT XPERT XPRESS SARS COVID-2/FLU/RSV     Status: None   Collection Time: 12/26/22 12:05 PM  Result Value Ref Range   SARS Coronavirus 2 neg    FLU A neg    FLU B neg    RSV RNA, PCR neg   Comprehensive metabolic panel     Status: None   Collection Time: 02/12/23 10:22 AM  Result Value Ref Range   Glucose 93 70 - 99 mg/dL  BUN 13 8 - 27 mg/dL   Creatinine, Ser 7.42 0.57 - 1.00 mg/dL    eGFR 61 >59 DG/LOV/5.64   BUN/Creatinine Ratio 14 12 - 28   Sodium 141 134 - 144 mmol/L   Potassium 5.1 3.5 - 5.2 mmol/L   Chloride 105 96 - 106 mmol/L   CO2 23 20 - 29 mmol/L   Calcium 9.8 8.7 - 10.3 mg/dL   Total Protein 6.9 6.0 - 8.5 g/dL   Albumin 4.2 3.7 - 4.7 g/dL   Globulin, Total 2.7 1.5 - 4.5 g/dL   Bilirubin Total 0.4 0.0 - 1.2 mg/dL   Alkaline Phosphatase 103 44 - 121 IU/L   AST 19 0 - 40 IU/L   ALT 11 0 - 32 IU/L  Lipid panel     Status: Abnormal   Collection Time: 02/12/23 10:22 AM  Result Value Ref Range   Cholesterol, Total 189 100 - 199 mg/dL   Triglycerides 70 0 - 149 mg/dL   HDL 65 >33 mg/dL   VLDL Cholesterol Cal 13 5 - 40 mg/dL   LDL Chol Calc (NIH) 295 (H) 0 - 99 mg/dL   Chol/HDL Ratio 2.9 0.0 - 4.4 ratio    Comment:                                   T. Chol/HDL Ratio                                             Men  Women                               1/2 Avg.Risk  3.4    3.3                                   Avg.Risk  5.0    4.4                                2X Avg.Risk  9.6    7.1                                3X Avg.Risk 23.4   11.0   Hemoglobin A1c     Status: Abnormal   Collection Time: 02/12/23 10:22 AM  Result Value Ref Range   Hgb A1c MFr Bld 6.3 (H) 4.8 - 5.6 %    Comment:          Prediabetes: 5.7 - 6.4          Diabetes: >6.4          Glycemic control for adults with diabetes: <7.0    Est. average glucose Bld gHb Est-mCnc 134 mg/dL  POCT CBG (Fasting - Glucose)     Status: Abnormal   Collection Time: 02/17/23 10:36 AM  Result Value Ref Range   Glucose Fasting, POC 106 (A) 70 - 99 mg/dL      Assessment & Plan:  As per problem list. Stricter low calorie diet, low cholesterol and low fat diet and exercise as much as  possible. Replace Olmesartan with Valsartan to see if if improves her cough per ENT. Problem List Items Addressed This Visit       Cardiovascular and Mediastinum   Benign essential hypertension   Relevant Medications    valsartan-hydrochlorothiazide (DIOVAN HCT) 160-12.5 MG tablet   Other Relevant Orders   CBC With Diff/Platelet     Genitourinary   Stage 3a chronic kidney disease (HCC)     Other   Hyperlipidemia, unspecified   Relevant Medications   valsartan-hydrochlorothiazide (DIOVAN HCT) 160-12.5 MG tablet   Other Relevant Orders   Lipid panel   Other Visit Diagnoses     Type 2 diabetes mellitus without complication, without long-term current use of insulin (HCC)    -  Primary   Relevant Medications   valsartan-hydrochlorothiazide (DIOVAN HCT) 160-12.5 MG tablet   Other Relevant Orders   POCT CBG (Fasting - Glucose) (Completed)   Prediabetes       Relevant Orders   Hemoglobin A1c       Return in about 3 months (around 05/19/2023) for awv with labs prior.   Total time spent: 20 minutes  Luna Fuse, MD  02/17/2023   This document may have been prepared by [facility] Voice Recognition software and as such may include unintentional dictation errors.

## 2023-03-03 DIAGNOSIS — K219 Gastro-esophageal reflux disease without esophagitis: Secondary | ICD-10-CM | POA: Diagnosis not present

## 2023-03-03 DIAGNOSIS — G4733 Obstructive sleep apnea (adult) (pediatric): Secondary | ICD-10-CM | POA: Diagnosis not present

## 2023-03-03 DIAGNOSIS — F411 Generalized anxiety disorder: Secondary | ICD-10-CM | POA: Diagnosis not present

## 2023-03-03 DIAGNOSIS — G709 Myoneural disorder, unspecified: Secondary | ICD-10-CM | POA: Diagnosis not present

## 2023-03-03 DIAGNOSIS — E785 Hyperlipidemia, unspecified: Secondary | ICD-10-CM | POA: Diagnosis not present

## 2023-03-03 DIAGNOSIS — G959 Disease of spinal cord, unspecified: Secondary | ICD-10-CM | POA: Diagnosis not present

## 2023-03-03 DIAGNOSIS — M109 Gout, unspecified: Secondary | ICD-10-CM | POA: Diagnosis not present

## 2023-03-03 DIAGNOSIS — Q438 Other specified congenital malformations of intestine: Secondary | ICD-10-CM | POA: Diagnosis not present

## 2023-03-03 DIAGNOSIS — J301 Allergic rhinitis due to pollen: Secondary | ICD-10-CM | POA: Diagnosis not present

## 2023-03-03 DIAGNOSIS — N3941 Urge incontinence: Secondary | ICD-10-CM | POA: Diagnosis not present

## 2023-03-03 DIAGNOSIS — M1990 Unspecified osteoarthritis, unspecified site: Secondary | ICD-10-CM | POA: Diagnosis not present

## 2023-03-03 DIAGNOSIS — M0550 Rheumatoid polyneuropathy with rheumatoid arthritis of unspecified site: Secondary | ICD-10-CM | POA: Diagnosis not present

## 2023-03-03 DIAGNOSIS — M5430 Sciatica, unspecified side: Secondary | ICD-10-CM | POA: Diagnosis not present

## 2023-03-17 DIAGNOSIS — N760 Acute vaginitis: Secondary | ICD-10-CM | POA: Diagnosis not present

## 2023-03-17 DIAGNOSIS — B3731 Acute candidiasis of vulva and vagina: Secondary | ICD-10-CM | POA: Diagnosis not present

## 2023-04-16 DIAGNOSIS — L309 Dermatitis, unspecified: Secondary | ICD-10-CM | POA: Diagnosis not present

## 2023-04-16 DIAGNOSIS — L218 Other seborrheic dermatitis: Secondary | ICD-10-CM | POA: Diagnosis not present

## 2023-04-16 DIAGNOSIS — L2089 Other atopic dermatitis: Secondary | ICD-10-CM | POA: Diagnosis not present

## 2023-04-23 ENCOUNTER — Emergency Department: Payer: Medicare PPO

## 2023-04-23 ENCOUNTER — Other Ambulatory Visit: Payer: Self-pay

## 2023-04-23 ENCOUNTER — Emergency Department
Admission: EM | Admit: 2023-04-23 | Discharge: 2023-04-23 | Disposition: A | Discharge status: Home / Self Care | Payer: Medicare PPO | Attending: Emergency Medicine | Admitting: Emergency Medicine

## 2023-04-23 ENCOUNTER — Encounter: Payer: Self-pay | Admitting: Intensive Care

## 2023-04-23 DIAGNOSIS — Y92410 Unspecified street and highway as the place of occurrence of the external cause: Secondary | ICD-10-CM | POA: Insufficient documentation

## 2023-04-23 DIAGNOSIS — M542 Cervicalgia: Secondary | ICD-10-CM | POA: Insufficient documentation

## 2023-04-23 DIAGNOSIS — M47812 Spondylosis without myelopathy or radiculopathy, cervical region: Secondary | ICD-10-CM | POA: Diagnosis not present

## 2023-04-23 DIAGNOSIS — I10 Essential (primary) hypertension: Secondary | ICD-10-CM | POA: Insufficient documentation

## 2023-04-23 DIAGNOSIS — S0990XA Unspecified injury of head, initial encounter: Secondary | ICD-10-CM | POA: Diagnosis not present

## 2023-04-23 DIAGNOSIS — M4802 Spinal stenosis, cervical region: Secondary | ICD-10-CM | POA: Diagnosis not present

## 2023-04-23 DIAGNOSIS — R519 Headache, unspecified: Secondary | ICD-10-CM | POA: Diagnosis not present

## 2023-04-23 DIAGNOSIS — M50222 Other cervical disc displacement at C5-C6 level: Secondary | ICD-10-CM | POA: Diagnosis not present
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
## 2023-04-30 ENCOUNTER — Other Ambulatory Visit: Payer: Self-pay | Admitting: Internal Medicine

## 2023-04-30 ENCOUNTER — Other Ambulatory Visit: Payer: Medicare PPO

## 2023-04-30 ENCOUNTER — Other Ambulatory Visit: Payer: Self-pay

## 2023-04-30 DIAGNOSIS — E119 Type 2 diabetes mellitus without complications: Secondary | ICD-10-CM

## 2023-04-30 DIAGNOSIS — R7303 Prediabetes: Secondary | ICD-10-CM

## 2023-04-30 DIAGNOSIS — I10 Essential (primary) hypertension: Secondary | ICD-10-CM

## 2023-04-30 DIAGNOSIS — E7800 Pure hypercholesterolemia, unspecified: Secondary | ICD-10-CM

## 2023-05-01 LAB — COMPREHENSIVE METABOLIC PANEL
ALT: 11 [IU]/L (ref 0–32)
AST: 22 [IU]/L (ref 0–40)
Albumin: 4 g/dL (ref 3.7–4.7)
Alkaline Phosphatase: 95 [IU]/L (ref 44–121)
BUN/Creatinine Ratio: 24 (ref 12–28)
BUN: 20 mg/dL (ref 8–27)
Bilirubin Total: 0.3 mg/dL (ref 0.0–1.2)
CO2: 21 mmol/L (ref 20–29)
Calcium: 9.5 mg/dL (ref 8.7–10.3)
Chloride: 107 mmol/L — ABNORMAL HIGH (ref 96–106)
Creatinine, Ser: 0.82 mg/dL (ref 0.57–1.00)
Globulin, Total: 2.4 g/dL (ref 1.5–4.5)
Glucose: 91 mg/dL (ref 70–99)
Potassium: 4.6 mmol/L (ref 3.5–5.2)
Sodium: 142 mmol/L (ref 134–144)
Total Protein: 6.4 g/dL (ref 6.0–8.5)
eGFR: 70 mL/min/{1.73_m2} (ref >59)

## 2023-05-01 LAB — LIPID PANEL
Chol/HDL Ratio: 3.2 ratio (ref 0.0–4.4)
Cholesterol, Total: 188 mg/dL (ref 100–199)
HDL: 59 mg/dL (ref >39)
LDL Chol Calc (NIH): 113 mg/dL — ABNORMAL HIGH (ref 0–99)
Triglycerides: 89 mg/dL (ref 0–149)
VLDL Cholesterol Cal: 16 mg/dL (ref 5–40)

## 2023-05-01 LAB — CBC WITH DIFF/PLATELET
Basophils Absolute: 0.1 10*3/uL (ref 0.0–0.2)
Basos: 1 %
EOS (ABSOLUTE): 0.1 10*3/uL (ref 0.0–0.4)
Eos: 2 %
Hematocrit: 42 % (ref 34.0–46.6)
Hemoglobin: 13.4 g/dL (ref 11.1–15.9)
Immature Grans (Abs): 0 10*3/uL (ref 0.0–0.1)
Immature Granulocytes: 0 %
Lymphocytes Absolute: 1.2 10*3/uL (ref 0.7–3.1)
Lymphs: 22 %
MCH: 28.8 pg (ref 26.6–33.0)
MCHC: 31.9 g/dL (ref 31.5–35.7)
MCV: 90 fL (ref 79–97)
Monocytes Absolute: 0.5 10*3/uL (ref 0.1–0.9)
Monocytes: 9 %
Neutrophils Absolute: 3.6 10*3/uL (ref 1.4–7.0)
Neutrophils: 66 %
Platelets: 310 10*3/uL (ref 150–450)
RBC: 4.65 x10E6/uL (ref 3.77–5.28)
RDW: 13.5 % (ref 11.7–15.4)
WBC: 5.4 10*3/uL (ref 3.4–10.8)

## 2023-05-01 LAB — HEMOGLOBIN A1C
Est. average glucose Bld gHb Est-mCnc: 134 mg/dL
Hgb A1c MFr Bld: 6.3 % — ABNORMAL HIGH (ref 4.8–5.6)

## 2023-05-05 ENCOUNTER — Encounter: Payer: Self-pay | Admitting: Internal Medicine

## 2023-05-05 ENCOUNTER — Ambulatory Visit: Payer: Medicare PPO | Admitting: Internal Medicine

## 2023-05-05 VITALS — BP 130/90 | HR 74 | Ht 68.0 in | Wt 160.6 lb

## 2023-05-05 DIAGNOSIS — I10 Essential (primary) hypertension: Secondary | ICD-10-CM

## 2023-05-05 DIAGNOSIS — M542 Cervicalgia: Secondary | ICD-10-CM

## 2023-05-05 DIAGNOSIS — M62830 Muscle spasm of back: Secondary | ICD-10-CM | POA: Insufficient documentation

## 2023-05-05 MED ORDER — VALSARTAN-HYDROCHLOROTHIAZIDE 160-12.5 MG PO TABS: 1.0000 | ORAL_TABLET | Freq: Every day | ORAL | 2 refills | Status: DC

## 2023-05-05 MED ORDER — CYCLOBENZAPRINE HCL 5 MG PO TABS: 5.0000 mg | ORAL_TABLET | Freq: Three times a day (TID) | ORAL | 1 refills | Status: DC | PRN

## 2023-05-05 MED ORDER — IBUPROFEN 600 MG PO TABS: 600.0000 mg | ORAL_TABLET | Freq: Four times a day (QID) | ORAL | 0 refills | Status: AC | PRN
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
## 2023-05-22 ENCOUNTER — Ambulatory Visit: Payer: Medicare PPO | Admitting: Internal Medicine

## 2023-05-22 VITALS — BP 160/94 | HR 85 | Ht 68.0 in | Wt 160.0 lb

## 2023-05-22 DIAGNOSIS — M10471 Other secondary gout, right ankle and foot: Secondary | ICD-10-CM

## 2023-05-22 DIAGNOSIS — R310 Gross hematuria: Secondary | ICD-10-CM | POA: Diagnosis not present

## 2023-05-22 DIAGNOSIS — M109 Gout, unspecified: Secondary | ICD-10-CM | POA: Insufficient documentation

## 2023-05-22 LAB — POCT URINALYSIS DIPSTICK
Bilirubin, UA: NEGATIVE
Blood, UA: NEGATIVE
Glucose, UA: NEGATIVE
Ketones, UA: NEGATIVE
Nitrite, UA: NEGATIVE
Protein, UA: NEGATIVE
Spec Grav, UA: 1.02 (ref 1.010–1.025)
Urobilinogen, UA: NEGATIVE U/dL — AB
pH, UA: 6 (ref 5.0–8.0)

## 2023-05-22 MED ORDER — COLCHICINE 0.6 MG PO TABS: 0.6000 mg | ORAL_TABLET | ORAL | 2 refills | Status: AC

## 2023-05-22 MED ORDER — INDOMETHACIN 50 MG PO CAPS: 50.0000 mg | ORAL_CAPSULE | Freq: Three times a day (TID) | ORAL | 1 refills | Status: AC | PRN

## 2023-05-22 NOTE — Progress Notes (Signed)
Established Patient Office Visit  Subjective:  Patient ID: Diana Sullivan, female    DOB: 03-24-1936  Age: 87 y.o. MRN: [id number]  Chief Complaint  Patient presents with   Foot Swelling    Pain, sweeling, sore, red X2 days    C/o acute pain of right foot x 2 days particularly affecting her big toe with prior h/o gout. Also c/o possible hematuria today.    No other concerns at this time.   Past Medical History:  Diagnosis Date   Anginal pain (HCC)    chest pain occasionally, checked out [facility] (no Issues)   Arthritis    hands   Bronchitis    Complication of anesthesia    patient stated"They had trouble waking me up"   Elevated lipids    Gout    Headache    once every 2 or 3 weeks   History of recent fall    shoulder and knee pain, left side   Hypertension    essential hypertension-controlled on meds   Mitral valve disorder    Muscular dystrophy (HCC)    Patient stated someone told her 25 years ago "that she had the gene for it" No issues   MVP (mitral valve prolapse)    Neuropathy    Pre-diabetes    watching diet   Wears partial dentures    upper and lower    Past Surgical History:  Procedure Laterality Date   ABDOMINAL HYSTERECTOMY     adenectomy     BACK SURGERY     BREAST SURGERY Right    biopsy, lumpectomy   BUNIONECTOMY Bilateral    CARDIAC CATHETERIZATION     no issue   CARPAL TUNNEL RELEASE Right    dr Merlyn Lot   CATARACT EXTRACTION Left 12/06/2012   CATARACT EXTRACTION W/PHACO Right 11/14/2019   Procedure: CATARACT EXTRACTION PHACO AND INTRAOCULAR LENS PLACEMENT (IOC) RIGHT 4.76  00:44.4;  Surgeon: Nevada Crane, MD;  Location: [facility];  Service: Ophthalmology;  Laterality: Right;   COLONOSCOPY N/A 04/21/2016   Procedure: COLONOSCOPY;  Surgeon: Scot Jun, MD;  Location: [facility] ENDOSCOPY;  Service: Endoscopy;  Laterality: N/A;  Diabetic   COLONOSCOPY N/A 06/05/2021   Procedure: COLONOSCOPY;  Surgeon:  Toledo, Boykin Nearing, MD;  Location: [facility] ENDOSCOPY;  Service: Gastroenterology;  Laterality: N/A;   EYE SURGERY     metatarsal osteotomy Left 03/18/2013   TONSILLECTOMY     TOTAL KNEE ARTHROPLASTY Left 12/31/2021   Procedure: LEFT TOTAL KNEE ARTHROPLASTY;  Surgeon: Kathryne Hitch, MD;  Location: [facility] OR;  Service: Orthopedics;  Laterality: Left;   TYMPANOSTOMY TUBE PLACEMENT      Social History   Socioeconomic History   Marital status: Divorced    Spouse name: Not on file   Number of children: Not on file   Years of education: Not on file   Highest education level: Not on file  Occupational History   Not on file  Tobacco Use   Smoking status: Former    Current packs/day: 0.00    Average packs/day: 0.3 packs/day for 27.0 years (6.8 ttl pk-yrs)    Types: Cigarettes    Start date: 04/20/1967    Quit date: 04/19/1994    Years since quitting: 29.1   Smokeless tobacco: Never   Tobacco comments:    QUIT 25 YEARS AGO  Vaping Use   Vaping status: Never Used  Substance and Sexual Activity   Alcohol use: Yes    Comment: SOCIAL  Drug use: Never   Sexual activity: Not on file  Other Topics Concern   Not on file  Social History Narrative   Not on file   Social Drivers of Health   Financial Resource Strain: Not on file  Food Insecurity: Not on file  Transportation Needs: Not on file  Physical Activity: Not on file  Stress: Not on file  Social Connections: Not on file  Intimate Partner Violence: Not on file    Family History  Problem Relation Age of Onset   Breast cancer Neg Hx    Neuropathy Neg Hx     Allergies  Allergen Reactions   Azithromycin Rash   Iodinated Contrast Media Itching and Swelling   Shellfish Allergy Itching and Swelling    Outpatient Medications Prior to Visit  Medication Sig   acetaminophen (TYLENOL) 500 MG tablet Take 500-1,000 mg by mouth every 6 (six) hours as needed for moderate pain.   cyclobenzaprine (FLEXERIL) 5 MG tablet Take 1  tablet (5 mg total) by mouth 3 (three) times daily as needed for muscle spasms.   escitalopram (LEXAPRO) 5 MG tablet Take 5 mg by mouth daily. (Patient not taking: Reported on 05/05/2023)   fexofenadine (ALLEGRA) 180 MG tablet Take 1 tablet (180 mg total) by mouth daily.   glucose blood (ONETOUCH ULTRA TEST) test strip Use as instructed (Patient not taking: Reported on 05/05/2023)   Lancet Devices (ONETOUCH DELICA PLUS LANCING) MISC 100 Lancets by Does not apply route 2 (two) times daily. (Patient not taking: Reported on 05/05/2023)   Omega-3 Fatty Acids (FISH OIL) 300 MG CAPS Take by mouth.   OneTouch UltraSoft 2 Lancets MISC 1 Lancet by Does not apply route 2 (two) times daily. (Patient not taking: Reported on 05/05/2023)   ONETOUCH VERIO test strip twice a day (Patient not taking: Reported on 05/05/2023)   valsartan-hydrochlorothiazide (DIOVAN HCT) 160-12.5 MG tablet Take 1 tablet by mouth daily.   No facility-administered medications prior to visit.    Review of Systems  Musculoskeletal:  Positive for joint pain.  All other systems reviewed and are negative.      Objective:   BP (!) 160/94   Pulse 85   Ht 5\' 8"  (1.727 m)   Wt 160 lb (72.6 kg)   SpO2 95%   BMI 24.33 kg/m   Vitals:   05/22/23 1452  BP: (!) 160/94  Pulse: 85  Height: 5\' 8"  (1.727 m)  Weight: 160 lb (72.6 kg)  SpO2: 95%  BMI (Calculated): 24.33    Physical Exam Musculoskeletal:     Comments: Inflamed right first MT joint with erythema and swelling      No results found for any visits on 05/22/23.  Recent Results (from the past 2160 hours)  CBC With Diff/Platelet     Status: None   Collection Time: 04/30/23 11:44 AM  Result Value Ref Range   WBC 5.4 3.4 - 10.8 x10E3/uL    Comment: **Effective May 11, 2023 profile 604540 WBC will be made**   non-orderable as a stand-alone order code.    RBC 4.65 3.77 - 5.28 x10E6/uL   Hemoglobin 13.4 11.1 - 15.9 g/dL   Hematocrit 98.1 19.1 - 46.6 %   MCV  90 79 - 97 fL   MCH 28.8 26.6 - 33.0 pg   MCHC 31.9 31.5 - 35.7 g/dL   RDW 47.8 29.5 - 62.1 %   Platelets 310 150 - 450 x10E3/uL   Neutrophils 66 Not Estab. %   Lymphs 22  Not Estab. %   Monocytes 9 Not Estab. %   Eos 2 Not Estab. %   Basos 1 Not Estab. %   Neutrophils Absolute 3.6 1.4 - 7.0 x10E3/uL   Lymphocytes Absolute 1.2 0.7 - 3.1 x10E3/uL   Monocytes Absolute 0.5 0.1 - 0.9 x10E3/uL   EOS (ABSOLUTE) 0.1 0.0 - 0.4 x10E3/uL   Basophils Absolute 0.1 0.0 - 0.2 x10E3/uL   Immature Granulocytes 0 Not Estab. %   Immature Grans (Abs) 0.0 0.0 - 0.1 x10E3/uL  Lipid panel     Status: Abnormal   Collection Time: 04/30/23 11:44 AM  Result Value Ref Range   Cholesterol, Total 188 100 - 199 mg/dL   Triglycerides 89 0 - 149 mg/dL   HDL 59 >25 mg/dL   VLDL Cholesterol Cal 16 5 - 40 mg/dL   LDL Chol Calc (NIH) 366 (H) 0 - 99 mg/dL   Chol/HDL Ratio 3.2 0.0 - 4.4 ratio    Comment:                                   T. Chol/HDL Ratio                                             Men  Women                               1/2 Avg.Risk  3.4    3.3                                   Avg.Risk  5.0    4.4                                2X Avg.Risk  9.6    7.1                                3X Avg.Risk 23.4   11.0   Hemoglobin A1c     Status: Abnormal   Collection Time: 04/30/23 11:44 AM  Result Value Ref Range   Hgb A1c MFr Bld 6.3 (H) 4.8 - 5.6 %    Comment:          Prediabetes: 5.7 - 6.4          Diabetes: >6.4          Glycemic control for adults with diabetes: <7.0    Est. average glucose Bld gHb Est-mCnc 134 mg/dL  Comprehensive metabolic panel     Status: Abnormal   Collection Time: 04/30/23 11:47 AM  Result Value Ref Range   Glucose 91 70 - 99 mg/dL   BUN 20 8 - 27 mg/dL   Creatinine, Ser 4.40 0.57 - 1.00 mg/dL   eGFR 70 >34 VQ/QVZ/5.63   BUN/Creatinine Ratio 24 12 - 28   Sodium 142 134 - 144 mmol/L   Potassium 4.6 3.5 - 5.2 mmol/L   Chloride 107 (H) 96 - 106 mmol/L   CO2 21 20 - 29  mmol/L   Calcium 9.5 8.7 - 10.3 mg/dL   Total  Protein 6.4 6.0 - 8.5 g/dL   Albumin 4.0 3.7 - 4.7 g/dL   Globulin, Total 2.4 1.5 - 4.5 g/dL   Bilirubin Total 0.3 0.0 - 1.2 mg/dL   Alkaline Phosphatase 95 44 - 121 IU/L   AST 22 0 - 40 IU/L   ALT 11 0 - 32 IU/L      Assessment & Plan:  As per problem list  Problem List Items Addressed This Visit       Other   Gout - Primary   Relevant Medications   colchicine 0.6 MG tablet   indomethacin (INDOCIN) 50 MG capsule   Other Relevant Orders   Uric acid   Other Visit Diagnoses       Gross hematuria       Relevant Orders   POCT Urinalysis Dipstick (16109)       Return if symptoms worsen or fail to improve.   Total time spent: 20 minutes  Luna Fuse, MD  05/22/2023   This document may have been prepared by [facility] Voice Recognition software and as such may include unintentional dictation errors.

## 2023-05-23 LAB — URIC ACID: Uric Acid: 6.6 mg/dL (ref 3.1–7.9)

## 2023-05-26 ENCOUNTER — Ambulatory Visit: Payer: Medicare PPO | Admitting: Internal Medicine

## 2023-06-01 ENCOUNTER — Encounter: Payer: Self-pay | Admitting: Internal Medicine

## 2023-06-01 ENCOUNTER — Ambulatory Visit: Payer: Medicare PPO | Admitting: Internal Medicine

## 2023-06-01 VITALS — BP 120/82 | HR 75 | Ht 68.0 in | Wt 156.2 lb

## 2023-06-01 DIAGNOSIS — J301 Allergic rhinitis due to pollen: Secondary | ICD-10-CM | POA: Diagnosis not present

## 2023-06-01 DIAGNOSIS — F4323 Adjustment disorder with mixed anxiety and depressed mood: Secondary | ICD-10-CM | POA: Diagnosis not present

## 2023-06-01 DIAGNOSIS — E119 Type 2 diabetes mellitus without complications: Secondary | ICD-10-CM

## 2023-06-01 DIAGNOSIS — Z1331 Encounter for screening for depression: Secondary | ICD-10-CM

## 2023-06-01 DIAGNOSIS — Z0001 Encounter for general adult medical examination with abnormal findings: Secondary | ICD-10-CM

## 2023-06-01 DIAGNOSIS — M10471 Other secondary gout, right ankle and foot: Secondary | ICD-10-CM

## 2023-06-01 MED ORDER — ALLOPURINOL 100 MG PO TABS: 100.0000 mg | ORAL_TABLET | Freq: Every day | ORAL | 0 refills | Status: DC

## 2023-06-01 MED ORDER — FEXOFENADINE HCL 180 MG PO TABS: 180.0000 mg | ORAL_TABLET | Freq: Every day | ORAL | 0 refills | Status: DC

## 2023-06-01 NOTE — Addendum Note (Signed)
Addended byKatherine Mantle on: 06/01/2023 08:45 AM   Modules accepted: Orders
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
## 2023-06-09 ENCOUNTER — Ambulatory Visit: Payer: Medicare PPO | Admitting: Internal Medicine

## 2023-07-07 ENCOUNTER — Ambulatory Visit: Payer: Medicare PPO | Admitting: Internal Medicine

## 2023-07-07 VITALS — BP 131/78 | HR 70 | Temp 97.3°F | Ht 67.0 in | Wt 160.8 lb

## 2023-07-07 DIAGNOSIS — M62830 Muscle spasm of back: Secondary | ICD-10-CM | POA: Diagnosis not present

## 2023-07-07 DIAGNOSIS — Z0130 Encounter for examination of blood pressure without abnormal findings: Secondary | ICD-10-CM
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
## 2023-07-25 ENCOUNTER — Emergency Department: Payer: Medicare PPO

## 2023-07-25 ENCOUNTER — Emergency Department
Admission: EM | Admit: 2023-07-25 | Discharge: 2023-07-25 | Disposition: A | Discharge status: Home / Self Care | Payer: Medicare PPO | Attending: Emergency Medicine | Admitting: Emergency Medicine

## 2023-07-25 ENCOUNTER — Other Ambulatory Visit: Payer: Self-pay

## 2023-07-25 DIAGNOSIS — R0789 Other chest pain: Secondary | ICD-10-CM | POA: Insufficient documentation

## 2023-07-25 DIAGNOSIS — R079 Chest pain, unspecified: Secondary | ICD-10-CM | POA: Diagnosis not present

## 2023-07-25 DIAGNOSIS — I10 Essential (primary) hypertension: Secondary | ICD-10-CM | POA: Diagnosis not present

## 2023-07-25 LAB — CBC
HCT: 40.8 % (ref 36.0–46.0)
Hemoglobin: 13.8 g/dL (ref 12.0–15.0)
MCH: 29.1 pg (ref 26.0–34.0)
MCHC: 33.8 g/dL (ref 30.0–36.0)
MCV: 86.1 fL (ref 80.0–100.0)
Platelets: 297 10*3/uL (ref 150–400)
RBC: 4.74 MIL/uL (ref 3.87–5.11)
RDW: 14.8 % (ref 11.5–15.5)
WBC: 6.7 10*3/uL (ref 4.0–10.5)
nRBC: 0 % (ref 0.0–0.2)

## 2023-07-25 LAB — BASIC METABOLIC PANEL
Anion gap: 9 (ref 5–15)
BUN: 19 mg/dL (ref 8–23)
CO2: 24 mmol/L (ref 22–32)
Calcium: 9.2 mg/dL (ref 8.9–10.3)
Chloride: 104 mmol/L (ref 98–111)
Creatinine, Ser: 0.87 mg/dL (ref 0.44–1.00)
GFR, Estimated: >60 mL/min (ref >60)
Glucose, Bld: 100 mg/dL — ABNORMAL HIGH (ref 70–99)
Potassium: 4.1 mmol/L (ref 3.5–5.1)
Sodium: 137 mmol/L (ref 135–145)

## 2023-07-25 LAB — HEPATIC FUNCTION PANEL
ALT: 14 U/L (ref 0–44)
AST: 26 U/L (ref 15–41)
Albumin: 3.9 g/dL (ref 3.5–5.0)
Alkaline Phosphatase: 80 U/L (ref 38–126)
Bilirubin, Direct: <0.1 mg/dL (ref 0.0–0.2)
Total Bilirubin: 0.7 mg/dL (ref 0.0–1.2)
Total Protein: 7 g/dL (ref 6.5–8.1)

## 2023-07-25 LAB — TROPONIN I (HIGH SENSITIVITY)
Troponin I (High Sensitivity): 6 ng/L (ref <18)
Troponin I (High Sensitivity): 7 ng/L (ref <18)

## 2023-07-25 LAB — LIPASE, BLOOD: Lipase: 21 U/L (ref 11–51)

## 2023-07-25 NOTE — Discharge Instructions (Signed)
You were seen in the emergency room today for evaluation of your chest pain.  Your testing was fortunately reassuring against an emergency cause for this.  Please make sure you are taking your medications as directed.  Please contact your cardiologist to arrange close follow-up.  Return to the ER for new or worsening symptoms.

## 2023-07-25 NOTE — ED Provider Notes (Signed)
[facility] Provider Note    Event Date/Time   First MD Initiated Contact with Patient 07/25/23 1500     (approximate)   History   Chest Pain   HPI  Diana Sullivan is a 88 year old female with history of hypertension, mitral valve prolapse presenting to the ER for evaluation of chest pain.  Patient was in the car with her son when she had onset of chest pain described as a pressure in the center of her chest radiating up to her jaw.  Episode lasted for about 30 minutes, resolved at the time of my initial evaluation.  Patient reports she has had similar episodes frequently for many years.  This episode was not more severe than prior episodes, but was with her son when it happened so she presented to the ER.  Currently feels improved.  Does see cardiology as an outpatient and reports they are aware of these episodes.  Did not take her blood pressure medicine this morning.     Physical Exam   Triage Vital Signs: ED Triage Vitals  Encounter Vitals Group     BP 07/25/23 1338 (!) 191/120     Systolic BP Percentile --      Diastolic BP Percentile --      Pulse Rate 07/25/23 1338 69     Resp 07/25/23 1338 19     Temp 07/25/23 1338 98 F (36.7 C)     Temp src --      SpO2 07/25/23 1338 97 %     Weight 07/25/23 1339 158 lb 11.7 oz (72 kg)     Height 07/25/23 1339 5\' 7"  (1.702 m)     Head Circumference --      Peak Flow --      Pain Score 07/25/23 1338 0     Pain Loc --      Pain Education --      Exclude from Growth Chart --     Most recent vital signs: Vitals:   07/25/23 1630 07/25/23 1700  BP: (!) 161/95 (!) 163/81  Pulse: 73 65  Resp: (!) 26 16  Temp:    SpO2: 97% 99%     General: Awake, interactive  CV:  Regular rate, good peripheral perfusion.  Resp:  Unlabored respirations, lungs clear to auscultation Abd:  Nondistended, soft, nontender to palpation Neuro:  Symmetric facial movement, fluid speech   ED Results / Procedures /  Treatments   Labs (all labs ordered are listed, but only abnormal results are displayed) Labs Reviewed  BASIC METABOLIC PANEL - Abnormal; Notable for the following components:      Result Value   Glucose, Bld 100 (*)    All other components within normal limits  CBC  HEPATIC FUNCTION PANEL  LIPASE, BLOOD  TROPONIN I (HIGH SENSITIVITY)  TROPONIN I (HIGH SENSITIVITY)     EKG EKG independently reviewed interpreted by myself (ER attending) demonstrates:  EKG demonstrates sinus rhythm noted to 71, PR 156, QRS 70, QTc 447, no acute ST changes  RADIOLOGY Imaging independently reviewed and interpreted by myself demonstrates:  CXR without focal consolidation  PROCEDURES:  Critical Care performed: No  Procedures   MEDICATIONS ORDERED IN ED: Medications - No data to display   IMPRESSION / MDM / ASSESSMENT AND PLAN / ED COURSE  I reviewed the triage vital signs and the nursing notes.  Differential diagnosis includes, but is not limited to, ACS, pneumonia, pneumothorax, symptomatic mitral valve prolapse, musculoskeletal pain  Patient's  presentation is most consistent with acute presentation with potential threat to life or bodily function.  88 year old female presenting to the emergency department for evaluation of chest pain.  Hypertensive on presentation, improved without intervention.  Left side from triage reassuring.  With onset shortly prior to presentation, repeat troponin was obtained which fortunately remained within normal limits.  Patient feeling much improved here.  Considered admission for patient, but history of multiple similar episodes for many years, has been seen by her cardiologist for this before.  With reassuring workup, do think discharge is reasonable which patient is comfortable with.  Strict return precautions provided.  Patient discharged in stable condition.    FINAL CLINICAL IMPRESSION(S) / ED DIAGNOSES   Final diagnoses:  Acute chest pain     Rx /  DC Orders   ED Discharge Orders     None        Note:  This document was prepared using Dragon voice recognition software and may include unintentional dictation errors.   Trinna Post, MD 07/25/23 Windy Fast

## 2023-07-25 NOTE — ED Triage Notes (Signed)
Pt to ED for intermittent chest pain radiating to neck, gums, back started within last 30 mins while shopping. Pt holding neck and appears uncomfortable. RR even and unlabored.  Pt forgot to take bp meds today

## 2023-08-06 DIAGNOSIS — M25511 Pain in right shoulder: Secondary | ICD-10-CM | POA: Diagnosis not present

## 2023-08-06 DIAGNOSIS — R2681 Unsteadiness on feet: Secondary | ICD-10-CM | POA: Diagnosis not present

## 2023-08-06 DIAGNOSIS — M6281 Muscle weakness (generalized): Secondary | ICD-10-CM | POA: Diagnosis not present

## 2023-08-06 DIAGNOSIS — M542 Cervicalgia: Secondary | ICD-10-CM | POA: Diagnosis not present

## 2023-08-06 DIAGNOSIS — R293 Abnormal posture: Secondary | ICD-10-CM | POA: Diagnosis not present

## 2023-08-07 ENCOUNTER — Ambulatory Visit: Payer: Medicare PPO | Admitting: Internal Medicine

## 2023-08-18 ENCOUNTER — Encounter: Payer: Self-pay | Admitting: Internal Medicine

## 2023-08-18 ENCOUNTER — Ambulatory Visit: Payer: Medicare PPO | Admitting: Internal Medicine

## 2023-08-18 VITALS — BP 113/73 | HR 73 | Ht 68.0 in | Wt 161.0 lb

## 2023-08-18 DIAGNOSIS — E119 Type 2 diabetes mellitus without complications: Secondary | ICD-10-CM

## 2023-08-18 DIAGNOSIS — E782 Mixed hyperlipidemia: Secondary | ICD-10-CM | POA: Diagnosis not present

## 2023-08-18 DIAGNOSIS — M62830 Muscle spasm of back: Secondary | ICD-10-CM

## 2023-08-18 DIAGNOSIS — E7800 Pure hypercholesterolemia, unspecified: Secondary | ICD-10-CM

## 2023-08-18 DIAGNOSIS — I10 Essential (primary) hypertension: Secondary | ICD-10-CM

## 2023-08-18 LAB — POCT CBG (FASTING - GLUCOSE)-MANUAL ENTRY: Glucose Fasting, POC: 99 mg/dL (ref 70–99)

## 2023-08-18 MED ORDER — CYCLOBENZAPRINE HCL 5 MG PO TABS: 5.0000 mg | ORAL_TABLET | Freq: Three times a day (TID) | ORAL | 1 refills | Status: AC | PRN

## 2023-08-18 MED ORDER — VALSARTAN-HYDROCHLOROTHIAZIDE 160-12.5 MG PO TABS: 1.0000 | ORAL_TABLET | Freq: Every day | ORAL | 2 refills | Status: DC

## 2023-08-18 NOTE — Progress Notes (Signed)
 Established Patient Office Visit  Subjective:  Patient ID: Diana Sullivan, female    DOB: [birth year]  Age: 88 y.o. MRN: [id number]  Chief Complaint  Patient presents with   Follow-up    10 week follow up with labs prior      C/o generalized upper body pain starting with her shoulders and radiate downwards.Pain can be quite severe but is relieved by otc tylenol. Has not taken prescribed antispasmodics.    No other concerns at this time.   Past Medical History:  Diagnosis Date   Anginal pain (HCC)    chest pain occasionally, checked out [facility] (no Issues)   Arthritis    hands   Bronchitis    Complication of anesthesia    patient stated"They had trouble waking me up"   Elevated lipids    Gout    Headache    once every 2 or 3 weeks   History of recent fall    shoulder and knee pain, left side   Hypertension    essential hypertension-controlled on meds   Mitral valve disorder    Muscular dystrophy (HCC)    Patient stated someone told her 25 years ago "that she had the gene for it" No issues   MVP (mitral valve prolapse)    Neuropathy    Pre-diabetes    watching diet   Wears partial dentures    upper and lower    Past Surgical History:  Procedure Laterality Date   ABDOMINAL HYSTERECTOMY     adenectomy     BACK SURGERY     BREAST SURGERY Right    biopsy, lumpectomy   BUNIONECTOMY Bilateral    CARDIAC CATHETERIZATION     no issue   CARPAL TUNNEL RELEASE Right    dr Merlyn Lot   CATARACT EXTRACTION Left 12/06/2012   CATARACT EXTRACTION W/PHACO Right 11/14/2019   Procedure: CATARACT EXTRACTION PHACO AND INTRAOCULAR LENS PLACEMENT (IOC) RIGHT 4.76  00:44.4;  Surgeon: Nevada Crane, MD;  Location: [facility];  Service: Ophthalmology;  Laterality: Right;   COLONOSCOPY N/A 04/21/2016   Procedure: COLONOSCOPY;  Surgeon: Scot Jun, MD;  Location: [facility] END[facility];  Service: Endoscopy;  Laterality: N/A;  Diabetic   COLONOSCOPY N/A  06/05/2021   Procedure: COLONOSCOPY;  Surgeon: Toledo, Boykin Nearing, MD;  Location: [facility] ENDOSCOPY;  Service: Gastroenterology;  Laterality: N/A;   EYE SURGERY     metatarsal osteotomy Left 03/18/2013   TONSILLECTOMY     TOTAL KNEE ARTHROPLASTY Left 12/31/2021   Procedure: LEFT TOTAL KNEE ARTHROPLASTY;  Surgeon: Kathryne Hitch, MD;  Location: [facility] OR;  Service: Orthopedics;  Laterality: Left;   TYMPANOSTOMY TUBE PLACEMENT      Social History   Socioeconomic History   Marital status: Divorced    Spouse name: Not on file   Number of children: Not on file   Years of education: Not on file   Highest education level: Not on file  Occupational History   Not on file  Tobacco Use   Smoking status: Former    Current packs/day: 0.00    Average packs/day: 0.3 packs/day for 27.0 years (6.8 ttl pk-yrs)    Types: Cigarettes    Start date: 04/20/1967    Quit date: 04/19/1994    Years since quitting: 29.3   Smokeless tobacco: Never   Tobacco comments:    QUIT 25 YEARS AGO  Vaping Use   Vaping status: Never Used  Substance and Sexual Activity   Alcohol use: Yes  Comment: SOCIAL   Drug use: Never   Sexual activity: Not on file  Other Topics Concern   Not on file  Social History Narrative   Not on file   Social Drivers of Health   Financial Resource Strain: Not on file  Food Insecurity: Not on file  Transportation Needs: Not on file  Physical Activity: Not on file  Stress: Not on file  Social Connections: Not on file  Intimate Partner Violence: Not on file    Family History  Problem Relation Age of Onset   Breast cancer Neg Hx    Neuropathy Neg Hx     Allergies  Allergen Reactions   Azithromycin Rash   Iodinated Contrast Media Itching and Swelling   Shellfish Allergy Itching and Swelling    Outpatient Medications Prior to Visit  Medication Sig   acetaminophen (TYLENOL) 500 MG tablet Take 500-1,000 mg by mouth every 6 (six) hours as needed for moderate pain.    allopurinol (ZYLOPRIM) 100 MG tablet Take 1 tablet (100 mg total) by mouth daily.   colchicine 0.6 MG tablet Take 1 tablet (0.6 mg total) by mouth as directed. Take first dose then second dose 6 hrs later then one daily till pain free.   escitalopram (LEXAPRO) 5 MG tablet Take 5 mg by mouth daily. (Patient not taking: Reported on 07/07/2023)   fexofenadine (ALLEGRA) 180 MG tablet Take 1 tablet (180 mg total) by mouth daily.   glucose blood (ONETOUCH ULTRA TEST) test strip Use as instructed (Patient not taking: Reported on 07/07/2023)   Lancet Devices (ONETOUCH DELICA PLUS LANCING) MISC 100 Lancets by Does not apply route 2 (two) times daily. (Patient not taking: Reported on 07/07/2023)   Omega-3 Fatty Acids (FISH OIL) 300 MG CAPS Take by mouth.   OneTouch UltraSoft 2 Lancets MISC 1 Lancet by Does not apply route 2 (two) times daily. (Patient not taking: Reported on 07/07/2023)   ONETOUCH VERIO test strip twice a day (Patient not taking: Reported on 07/07/2023)   valsartan-hydrochlorothiazide (DIOVAN HCT) 160-12.5 MG tablet Take 1 tablet by mouth daily.   No facility-administered medications prior to visit.    Review of Systems  Constitutional: Negative.   HENT: Negative.    Eyes: Negative.   Respiratory: Negative.    Cardiovascular: Negative.   Gastrointestinal: Negative.   Genitourinary: Negative.   Musculoskeletal:  Positive for joint pain.  Skin: Negative.   Neurological: Negative.   Endo/Heme/Allergies: Negative.   Psychiatric/Behavioral:  Positive for depression. The patient is nervous/anxious.   All other systems reviewed and are negative.      Objective:   Ht 5\' 8"  (1.727 m)   Wt 161 lb (73 kg)   BMI 24.48 kg/m   Vitals:   08/18/23 1037  Height: 5\' 8"  (1.727 m)  Weight: 161 lb (73 kg)  BMI (Calculated): 24.49    Physical Exam Vitals reviewed.  Constitutional:      General: She is not in acute distress. HENT:     Head: Normocephalic.     Nose: Nose normal.      Mouth/Throat:     Mouth: Mucous membranes are moist.  Eyes:     Extraocular Movements: Extraocular movements intact.     Pupils: Pupils are equal, round, and reactive to light.  Cardiovascular:     Rate and Rhythm: Normal rate and regular rhythm.     Heart sounds: No murmur heard. Pulmonary:     Effort: Pulmonary effort is normal.     Breath sounds:  No rhonchi or rales.  Abdominal:     General: Abdomen is flat.     Palpations: There is no hepatomegaly, splenomegaly or mass.  Musculoskeletal:        General: Swelling present. Normal range of motion.     Cervical back: Normal range of motion. Spasms (left paraspinal muscle) present. No tenderness.     Right foot: Swelling (around 1st metatarsal) present.  Skin:    General: Skin is warm and dry.  Neurological:     General: No focal deficit present.     Mental Status: She is alert and oriented to person, place, and time.     Cranial Nerves: No cranial nerve deficit.     Motor: No weakness.  Psychiatric:        Mood and Affect: Mood normal.        Behavior: Behavior normal.      Results for orders placed or performed in visit on 08/18/23  POCT CBG (Fasting - Glucose)  Result Value Ref Range   Glucose Fasting, POC 99 70 - 99 mg/dL    Recent Results (from the past 2160 hours)  Uric acid     Status: None   Collection Time: 05/22/23  3:42 PM  Result Value Ref Range   Uric Acid 6.6 3.1 - 7.9 mg/dL    Comment:            Therapeutic target for gout patients: <6.0  POCT Urinalysis Dipstick ([address])     Status: Abnormal   Collection Time: 05/22/23  3:42 PM  Result Value Ref Range   Color, UA YELLOW    Clarity, UA CLEAR    Glucose, UA Negative Negative   Bilirubin, UA Negative    Ketones, UA Negative    Spec Grav, UA 1.020 1.010 - 1.025   Blood, UA Negative    pH, UA 6.0 5.0 - 8.0   Protein, UA Negative Negative   Urobilinogen, UA negative (A) 0.2 or 1.0 E.U./dL   Nitrite, UA Negative    Leukocytes, UA Moderate (2+) (A)  Negative   Appearance CLEAR    Odor    Basic metabolic panel     Status: Abnormal   Collection Time: 07/25/23  1:40 PM  Result Value Ref Range   Sodium 137 135 - 145 mmol/L   Potassium 4.1 3.5 - 5.1 mmol/L   Chloride 104 98 - 111 mmol/L   CO2 24 22 - 32 mmol/L   Glucose, Bld 100 (H) 70 - 99 mg/dL    Comment: Glucose reference range applies only to samples taken after fasting for at least 8 hours.   BUN 19 8 - 23 mg/dL   Creatinine, Ser 6.04 0.44 - 1.00 mg/dL   Calcium 9.2 8.9 - 54.0 mg/dL   GFR, Estimated >98 >11 mL/min    Comment: (NOTE) Calculated using the CKD-EPI Creatinine Equation (2021)    Anion gap 9 5 - 15    Comment: Performed at [facility], [address] [place], [place] [address]  CBC     Status: None   Collection Time: 07/25/23  1:40 PM  Result Value Ref Range   WBC 6.7 4.0 - 10.5 K/uL   RBC 4.74 3.87 - 5.11 MIL/uL   Hemoglobin 13.8 12.0 - 15.0 g/dL   HCT 29.5 62.1 - 30.8 %   MCV 86.1 80.0 - 100.0 fL   MCH 29.1 26.0 - 34.0 pg   MCHC 33.8 30.0 - 36.0 g/dL   RDW 65.7 84.6 -  15.5 %   Platelets 297 150 - 400 K/uL   nRBC 0.0 0.0 - 0.2 %    Comment: Performed at [facility], [address] [place], [place] [address]  Troponin I (High Sensitivity)     Status: None   Collection Time: 07/25/23  1:40 PM  Result Value Ref Range   Troponin I (High Sensitivity) 6 <18 ng/L    Comment: (NOTE) Elevated high sensitivity troponin I (hsTnI) values and significant  changes across serial measurements may suggest ACS but many other  chronic and acute conditions are known to elevate hsTnI results.  Refer to the "Links" section for chest pain algorithms and additional  guidance. Performed at [facility], [address] [place], [place] [address]   Hepatic function panel     Status: None   Collection Time: 07/25/23  1:40 PM  Result Value Ref Range   Total Protein 7.0 6.5 - 8.1 g/dL   Albumin 3.9 3.5 - 5.0 g/dL   AST 26 15 - 41 U/L   ALT  14 0 - 44 U/L   Alkaline Phosphatase 80 38 - 126 U/L   Total Bilirubin 0.7 0.0 - 1.2 mg/dL   Bilirubin, Direct <0.9 0.0 - 0.2 mg/dL   Indirect Bilirubin NOT CALCULATED 0.3 - 0.9 mg/dL    Comment: Performed at [facility], [address] [place], [place] [address]  Lipase, blood     Status: None   Collection Time: 07/25/23  1:40 PM  Result Value Ref Range   Lipase 21 11 - 51 U/L    Comment: Performed at [facility], [address], [place], [place] [address]  Troponin I (High Sensitivity)     Status: None   Collection Time: 07/25/23  4:41 PM  Result Value Ref Range   Troponin I (High Sensitivity) 7 <18 ng/L    Comment: (NOTE) Elevated high sensitivity troponin I (hsTnI) values and significant  changes across serial measurements may suggest ACS but many other  chronic and acute conditions are known to elevate hsTnI results.  Refer to the "Links" section for chest pain algorithms and additional  guidance. Performed at [facility], [address] [place], [place] [address]   POCT CBG (Fasting - Glucose)     Status: None   Collection Time: 08/18/23 10:46 AM  Result Value Ref Range   Glucose Fasting, POC 99 70 - 99 mg/dL      Assessment & Plan:  As per problem list. Instructed to comply with antispasmodics. Problem List Items Addressed This Visit       Cardiovascular and Mediastinum   Benign essential hypertension     Musculoskeletal and Integument   Spasm of left trapezius muscle     Other   Hyperlipidemia, unspecified   Other Visit Diagnoses       Type 2 diabetes mellitus without complication, without long-term current use of insulin (HCC)    -  Primary   Relevant Orders   POCT CBG (Fasting - Glucose) (Completed)       Return in about 3 months (around 11/18/2023) for fu with labs prior.   Total time spent: 20 minutes  Luna Fuse, MD  08/18/2023   This document may have been prepared by [facility] Voice Recognition  software and as such may include unintentional dictation errors.

## 2023-08-27 ENCOUNTER — Other Ambulatory Visit: Payer: Self-pay | Admitting: Internal Medicine

## 2023-08-27 ENCOUNTER — Other Ambulatory Visit

## 2023-08-27 DIAGNOSIS — E7800 Pure hypercholesterolemia, unspecified: Secondary | ICD-10-CM | POA: Diagnosis not present

## 2023-08-27 DIAGNOSIS — E119 Type 2 diabetes mellitus without complications: Secondary | ICD-10-CM | POA: Diagnosis not present

## 2023-08-27 DIAGNOSIS — I10 Essential (primary) hypertension: Secondary | ICD-10-CM | POA: Diagnosis not present

## 2023-08-28 LAB — COMPREHENSIVE METABOLIC PANEL
ALT: 12 IU/L (ref 0–32)
AST: 21 IU/L (ref 0–40)
Albumin: 4.2 g/dL (ref 3.7–4.7)
Alkaline Phosphatase: 100 IU/L (ref 44–121)
BUN/Creatinine Ratio: 24 (ref 12–28)
BUN: 23 mg/dL (ref 8–27)
Bilirubin Total: 0.3 mg/dL (ref 0.0–1.2)
CO2: 22 mmol/L (ref 20–29)
Calcium: 9.6 mg/dL (ref 8.7–10.3)
Chloride: 107 mmol/L — ABNORMAL HIGH (ref 96–106)
Creatinine, Ser: 0.94 mg/dL (ref 0.57–1.00)
Globulin, Total: 2.4 g/dL (ref 1.5–4.5)
Glucose: 96 mg/dL (ref 70–99)
Potassium: 5.1 mmol/L (ref 3.5–5.2)
Sodium: 144 mmol/L (ref 134–144)
Total Protein: 6.6 g/dL (ref 6.0–8.5)
eGFR: 59 mL/min/{1.73_m2} — ABNORMAL LOW (ref >59)

## 2023-08-28 LAB — LIPID PANEL
Chol/HDL Ratio: 2.8 ratio (ref 0.0–4.4)
Cholesterol, Total: 184 mg/dL (ref 100–199)
HDL: 65 mg/dL (ref >39)
LDL Chol Calc (NIH): 108 mg/dL — ABNORMAL HIGH (ref 0–99)
Triglycerides: 58 mg/dL (ref 0–149)
VLDL Cholesterol Cal: 11 mg/dL (ref 5–40)

## 2023-08-28 LAB — HEMOGLOBIN A1C
Est. average glucose Bld gHb Est-mCnc: 134 mg/dL
Hgb A1c MFr Bld: 6.3 % — ABNORMAL HIGH (ref 4.8–5.6)

## 2023-09-03 DIAGNOSIS — N1831 Chronic kidney disease, stage 3a: Secondary | ICD-10-CM | POA: Diagnosis not present

## 2023-09-03 DIAGNOSIS — I340 Nonrheumatic mitral (valve) insufficiency: Secondary | ICD-10-CM | POA: Diagnosis not present

## 2023-09-03 DIAGNOSIS — E785 Hyperlipidemia, unspecified: Secondary | ICD-10-CM | POA: Diagnosis not present

## 2023-09-03 DIAGNOSIS — R079 Chest pain, unspecified: Secondary | ICD-10-CM | POA: Diagnosis not present

## 2023-09-03 DIAGNOSIS — Z23 Encounter for immunization: Secondary | ICD-10-CM | POA: Diagnosis not present

## 2023-09-03 DIAGNOSIS — I341 Nonrheumatic mitral (valve) prolapse: Secondary | ICD-10-CM | POA: Diagnosis not present

## 2023-09-03 DIAGNOSIS — Z9889 Other specified postprocedural states: Secondary | ICD-10-CM | POA: Diagnosis not present

## 2023-09-03 DIAGNOSIS — I10 Essential (primary) hypertension: Secondary | ICD-10-CM | POA: Diagnosis not present

## 2023-09-09 ENCOUNTER — Ambulatory Visit: Attending: Internal Medicine

## 2023-09-09 DIAGNOSIS — M5459 Other low back pain: Secondary | ICD-10-CM | POA: Insufficient documentation

## 2023-09-09 DIAGNOSIS — M542 Cervicalgia: Secondary | ICD-10-CM | POA: Diagnosis not present

## 2023-09-09 DIAGNOSIS — M6281 Muscle weakness (generalized): Secondary | ICD-10-CM | POA: Diagnosis not present
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
## 2023-09-10 ENCOUNTER — Ambulatory Visit: Payer: Medicare PPO | Admitting: Family Medicine

## 2023-09-21 ENCOUNTER — Ambulatory Visit

## 2023-09-21 DIAGNOSIS — M5459 Other low back pain: Secondary | ICD-10-CM | POA: Diagnosis not present

## 2023-09-21 DIAGNOSIS — M542 Cervicalgia: Secondary | ICD-10-CM | POA: Diagnosis not present

## 2023-09-21 DIAGNOSIS — M6281 Muscle weakness (generalized): Secondary | ICD-10-CM | POA: Diagnosis not present
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
## 2023-09-23 ENCOUNTER — Ambulatory Visit

## 2023-09-28 ENCOUNTER — Ambulatory Visit

## 2023-09-28 DIAGNOSIS — M5459 Other low back pain: Secondary | ICD-10-CM | POA: Diagnosis not present

## 2023-09-28 DIAGNOSIS — M542 Cervicalgia: Secondary | ICD-10-CM

## 2023-09-28 DIAGNOSIS — M6281 Muscle weakness (generalized): Secondary | ICD-10-CM | POA: Diagnosis not present
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
## 2023-10-01 ENCOUNTER — Ambulatory Visit

## 2023-10-01 DIAGNOSIS — M6281 Muscle weakness (generalized): Secondary | ICD-10-CM

## 2023-10-01 DIAGNOSIS — M542 Cervicalgia: Secondary | ICD-10-CM | POA: Diagnosis not present

## 2023-10-01 DIAGNOSIS — M5459 Other low back pain: Secondary | ICD-10-CM
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
## 2023-10-05 ENCOUNTER — Ambulatory Visit

## 2023-10-07 ENCOUNTER — Telehealth: Payer: Self-pay

## 2023-10-07 ENCOUNTER — Ambulatory Visit

## 2023-10-07 NOTE — Telephone Encounter (Signed)
 Called patient about missed appointment. No answer but LVM regarding next following appointment. Advised of no show policy and to call for future rescheduling or concerns.

## 2023-10-13 ENCOUNTER — Ambulatory Visit: Attending: Internal Medicine

## 2023-10-13 DIAGNOSIS — M5459 Other low back pain: Secondary | ICD-10-CM | POA: Insufficient documentation

## 2023-10-13 DIAGNOSIS — M6281 Muscle weakness (generalized): Secondary | ICD-10-CM | POA: Insufficient documentation

## 2023-10-13 DIAGNOSIS — M542 Cervicalgia: Secondary | ICD-10-CM | POA: Diagnosis not present
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
## 2023-10-15 ENCOUNTER — Ambulatory Visit

## 2023-10-15 DIAGNOSIS — M6281 Muscle weakness (generalized): Secondary | ICD-10-CM | POA: Diagnosis not present

## 2023-10-15 DIAGNOSIS — M5459 Other low back pain: Secondary | ICD-10-CM

## 2023-10-15 DIAGNOSIS — M542 Cervicalgia: Secondary | ICD-10-CM | POA: Diagnosis not present
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
## 2023-10-19 ENCOUNTER — Ambulatory Visit

## 2023-10-19 DIAGNOSIS — M5459 Other low back pain: Secondary | ICD-10-CM | POA: Diagnosis not present

## 2023-10-19 DIAGNOSIS — M6281 Muscle weakness (generalized): Secondary | ICD-10-CM | POA: Diagnosis not present

## 2023-10-19 DIAGNOSIS — M542 Cervicalgia: Secondary | ICD-10-CM | POA: Diagnosis not present
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
## 2023-10-20 ENCOUNTER — Telehealth: Payer: Self-pay

## 2023-10-20 NOTE — Telephone Encounter (Signed)
 Pt called wanting to schedule an appointment, but said she has been running a fever, has congestion, dry cough, and mentioned her throat is very sore/feels swollen. Has had symptoms since Friday, I didn't know if you wanted her to make an appt or send in something for her ? Please advise

## 2023-10-20 NOTE — Progress Notes (Signed)
   10/20/2023  Patient ID: Diana Sullivan, female   DOB: Apr 02, 1936, 88 y.o.   MRN: [id number]  Pharmacy Quality Measure Review  This patient is appearing on a report for being at risk of failing the adherence measure for hypertension (ACEi/ARB) medications this calendar year.   Medication: Valsartan -hydrochlorothiazide  160-12.5mg  Last fill date: 09/23/23 for 30 day supply  Insurance report was not up to date. No action needed at this time.   Carnell Christian, PharmD Clinical Pharmacist [phone]

## 2023-10-21 ENCOUNTER — Encounter: Payer: Self-pay | Admitting: Internal Medicine

## 2023-10-21 ENCOUNTER — Ambulatory Visit: Admitting: Internal Medicine

## 2023-10-21 ENCOUNTER — Ambulatory Visit

## 2023-10-21 ENCOUNTER — Ambulatory Visit: Payer: Self-pay | Admitting: Internal Medicine

## 2023-10-21 VITALS — BP 138/78 | HR 76 | Temp 98.1°F | Ht 68.0 in | Wt 165.2 lb

## 2023-10-21 DIAGNOSIS — J301 Allergic rhinitis due to pollen: Secondary | ICD-10-CM

## 2023-10-21 DIAGNOSIS — Z0130 Encounter for examination of blood pressure without abnormal findings: Secondary | ICD-10-CM

## 2023-10-21 DIAGNOSIS — R7303 Prediabetes: Secondary | ICD-10-CM

## 2023-10-21 DIAGNOSIS — J209 Acute bronchitis, unspecified: Secondary | ICD-10-CM

## 2023-10-21 DIAGNOSIS — J029 Acute pharyngitis, unspecified: Secondary | ICD-10-CM | POA: Diagnosis not present

## 2023-10-21 DIAGNOSIS — J069 Acute upper respiratory infection, unspecified: Secondary | ICD-10-CM | POA: Diagnosis not present

## 2023-10-21 DIAGNOSIS — R042 Hemoptysis: Secondary | ICD-10-CM

## 2023-10-21 LAB — POCT XPERT XPRESS SARS COVID-2/FLU/RSV
FLU A: NEGATIVE
FLU B: NEGATIVE
RSV RNA, PCR: NEGATIVE
SARS Coronavirus 2: NEGATIVE

## 2023-10-21 LAB — POCT CBG (FASTING - GLUCOSE)-MANUAL ENTRY: Glucose Fasting, POC: 110 mg/dL — AB (ref 70–99)

## 2023-10-21 LAB — POCT RAPID STREP A (OFFICE): Rapid Strep A Screen: NEGATIVE

## 2023-10-21 MED ORDER — BENZONATATE 100 MG PO CAPS
100.0000 mg | ORAL_CAPSULE | Freq: Three times a day (TID) | ORAL | 0 refills | Status: AC | PRN
Start: 2023-10-21 — End: 2023-10-31

## 2023-10-21 MED ORDER — FEXOFENADINE HCL 180 MG PO TABS: 180.0000 mg | ORAL_TABLET | Freq: Every day | ORAL | 0 refills | Status: AC

## 2023-10-21 MED ORDER — DOXYCYCLINE HYCLATE 100 MG PO TABS: 100.0000 mg | ORAL_TABLET | Freq: Two times a day (BID) | ORAL | 0 refills | Status: AC

## 2023-10-21 MED ORDER — PREDNISONE 20 MG PO TABS: 40.0000 mg | ORAL_TABLET | Freq: Every day | ORAL | 0 refills | Status: AC

## 2023-10-21 NOTE — Progress Notes (Signed)
 Patient notified

## 2023-10-21 NOTE — Progress Notes (Signed)
 Established Patient Office Visit  Subjective:  Patient ID: Diana Sullivan, female    DOB: May 14, 1936  Age: 88 y.o. MRN: [id number]  Chief Complaint  Patient presents with   Sore Throat   Fever    C/o 5 day h/o fever, sore throat, nasal discharge but denies lethargy or malaise. Chronic cough stable but denies SOB, nausea or vomiting. Has not tested for viral illness at home or elsewhere.    No other concerns at this time.   Past Medical History:  Diagnosis Date   Anginal pain (HCC)    chest pain occasionally, checked out Dr [facility] (no Issues)   Arthritis    hands   Bronchitis    Complication of anesthesia    patient stated"They had trouble waking me up"   Elevated lipids    Gout    Headache    once every 2 or 3 weeks   History of recent fall    shoulder and knee pain, left side   Hypertension    essential hypertension-controlled on meds   Mitral valve disorder    Muscular dystrophy (HCC)    Patient stated someone told her 25 years ago "that she had the gene for it" No issues   MVP (mitral valve prolapse)    Neuropathy    Pre-diabetes    watching diet   Wears partial dentures    upper and lower    Past Surgical History:  Procedure Laterality Date   ABDOMINAL HYSTERECTOMY     adenectomy     BACK SURGERY     BREAST SURGERY Right    biopsy, lumpectomy   BUNIONECTOMY Bilateral    CARDIAC CATHETERIZATION     no issue   CARPAL TUNNEL RELEASE Right    dr Huntley Mai   CATARACT EXTRACTION Left 12/06/2012   CATARACT EXTRACTION W/PHACO Right 11/14/2019   Procedure: CATARACT EXTRACTION PHACO AND INTRAOCULAR LENS PLACEMENT (IOC) RIGHT 4.76  00:44.4;  Surgeon: Rosa College, MD;  Location: [facility];  Service: Ophthalmology;  Laterality: Right;   COLONOSCOPY N/A 04/21/2016   Procedure: COLONOSCOPY;  Surgeon: Cassie Click, MD;  Location: [facility];  Service: Endoscopy;  Laterality: N/A;  Diabetic   COLONOSCOPY N/A 06/05/2021    Procedure: COLONOSCOPY;  Surgeon: Toledo, Alphonsus Jeans, MD;  Location: [facility] ENDOSCOPY;  Service: Gastroenterology;  Laterality: N/A;   EYE SURGERY     metatarsal osteotomy Left 03/18/2013   TONSILLECTOMY     TOTAL KNEE ARTHROPLASTY Left 12/31/2021   Procedure: LEFT TOTAL KNEE ARTHROPLASTY;  Surgeon: Arnie Lao, MD;  Location: [facility] OR;  Service: Orthopedics;  Laterality: Left;   TYMPANOSTOMY TUBE PLACEMENT      Social History   Socioeconomic History   Marital status: Divorced    Spouse name: Not on file   Number of children: Not on file   Years of education: Not on file   Highest education level: Not on file  Occupational History   Not on file  Tobacco Use   Smoking status: Former    Current packs/day: 0.00    Average packs/day: 0.3 packs/day for 27.0 years (6.8 ttl pk-yrs)    Types: Cigarettes    Start date: 04/20/1967    Quit date: 04/19/1994    Years since quitting: 29.5   Smokeless tobacco: Never   Tobacco comments:    QUIT 25 YEARS AGO  Vaping Use   Vaping status: Never Used  Substance and Sexual Activity   Alcohol use: Yes  Comment: SOCIAL   Drug use: Never   Sexual activity: Not on file  Other Topics Concern   Not on file  Social History Narrative   Not on file   Social Drivers of Health   Financial Resource Strain: Not on file  Food Insecurity: Not on file  Transportation Needs: Not on file  Physical Activity: Not on file  Stress: Not on file  Social Connections: Not on file  Intimate Partner Violence: Not on file    Family History  Problem Relation Age of Onset   Breast cancer Neg Hx    Neuropathy Neg Hx     Allergies  Allergen Reactions   Azithromycin Rash   Iodinated Contrast Media Itching and Swelling   Shellfish Allergy Itching and Swelling    Outpatient Medications Prior to Visit  Medication Sig   acetaminophen  (TYLENOL ) 500 MG tablet Take 500-1,000 mg by mouth every 6 (six) hours as needed for moderate pain.   allopurinol   (ZYLOPRIM ) 100 MG tablet Take 1 tablet (100 mg total) by mouth daily.   fexofenadine  (ALLEGRA ) 180 MG tablet Take 1 tablet (180 mg total) by mouth daily.   glucose blood (ONETOUCH ULTRA TEST) test strip Use as instructed   Lancet Devices (ONETOUCH DELICA PLUS LANCING) MISC 100 Lancets by Does not apply route 2 (two) times daily.   OneTouch UltraSoft 2 Lancets MISC 1 Lancet by Does not apply route 2 (two) times daily.   ONETOUCH VERIO test strip twice a day   valsartan -hydrochlorothiazide  (DIOVAN  HCT) 160-12.5 MG tablet Take 1 tablet by mouth daily.   colchicine  0.6 MG tablet Take 1 tablet (0.6 mg total) by mouth as directed. Take first dose then second dose 6 hrs later then one daily till pain free. (Patient not taking: Reported on 10/21/2023)   escitalopram (LEXAPRO) 5 MG tablet Take 5 mg by mouth daily. (Patient not taking: Reported on 10/21/2023)   Omega-3 Fatty Acids (FISH OIL) 300 MG CAPS Take by mouth. (Patient not taking: Reported on 10/21/2023)   No facility-administered medications prior to visit.    Review of Systems  Constitutional: Negative.   HENT: Negative.    Eyes: Negative.   Respiratory:  Positive for cough. Negative for sputum production and shortness of breath.   Cardiovascular: Negative.   Gastrointestinal: Negative.   Genitourinary: Negative.   Musculoskeletal:  Positive for joint pain.  Skin: Negative.   Neurological: Negative.   Endo/Heme/Allergies: Negative.   Psychiatric/Behavioral:  Positive for depression. The patient is nervous/anxious.   All other systems reviewed and are negative.      Objective:   BP 138/78   Pulse 76   Temp 98.1 F (36.7 C) (Tympanic)   Ht 5\' 8"  (1.727 m)   Wt 165 lb 3.2 oz (74.9 kg)   SpO2 97%   BMI 25.12 kg/m   Vitals:   10/21/23 1032  BP: 138/78  Pulse: 76  Temp: 98.1 F (36.7 C)  Height: 5\' 8"  (1.727 m)  Weight: 165 lb 3.2 oz (74.9 kg)  SpO2: 97%  TempSrc: Tympanic  BMI (Calculated): 25.12    Physical  Exam Vitals reviewed.  Constitutional:      General: She is not in acute distress. HENT:     Head: Normocephalic.     Nose: Nose normal.     Mouth/Throat:     Mouth: Mucous membranes are moist.  Eyes:     Extraocular Movements: Extraocular movements intact.     Pupils: Pupils are equal, round, and reactive to  light.  Cardiovascular:     Rate and Rhythm: Normal rate and regular rhythm.     Heart sounds: No murmur heard. Pulmonary:     Effort: Pulmonary effort is normal.     Breath sounds: No rhonchi or rales.     Comments: Coughing spasm after deep inspiration for examination Abdominal:     General: Abdomen is flat.     Palpations: There is no hepatomegaly, splenomegaly or mass.  Musculoskeletal:        General: Swelling present. Normal range of motion.     Cervical back: Normal range of motion. Spasms (left paraspinal muscle) present. No tenderness.     Right foot: Swelling (around 1st metatarsal) present.  Skin:    General: Skin is warm and dry.  Neurological:     General: No focal deficit present.     Mental Status: She is alert and oriented to person, place, and time.     Cranial Nerves: No cranial nerve deficit.     Motor: No weakness.  Psychiatric:        Mood and Affect: Mood normal.        Behavior: Behavior normal.      Results for orders placed or performed in visit on 10/21/23  POCT CBG (Fasting - Glucose)  Result Value Ref Range   Glucose Fasting, POC 110 (A) 70 - 99 mg/dL        Assessment & Plan:  As per problem list  Problem List Items Addressed This Visit   None Visit Diagnoses       Type 2 diabetes mellitus without complication, without long-term current use of insulin (HCC)    -  Primary   Relevant Orders   POCT CBG (Fasting - Glucose) (Completed)       No follow-ups on file.   Total time spent: 20 minutes  Arzella Bitters, MD  10/21/2023   This document may have been prepared by [facility] Voice Recognition software and as  such may include unintentional dictation errors.

## 2023-10-23 ENCOUNTER — Ambulatory Visit
Admission: RE | Admit: 2023-10-23 | Discharge: 2023-10-23 | Disposition: A | Discharge status: Home / Self Care | Source: Ambulatory Visit | Attending: Internal Medicine | Admitting: Internal Medicine

## 2023-10-23 ENCOUNTER — Ambulatory Visit
Admission: RE | Admit: 2023-10-23 | Discharge: 2023-10-23 | Disposition: A | Discharge status: Home / Self Care | Attending: Internal Medicine | Admitting: Internal Medicine

## 2023-10-23 DIAGNOSIS — J209 Acute bronchitis, unspecified: Secondary | ICD-10-CM

## 2023-10-23 DIAGNOSIS — R059 Cough, unspecified: Secondary | ICD-10-CM | POA: Diagnosis not present

## 2023-10-23 DIAGNOSIS — R509 Fever, unspecified: Secondary | ICD-10-CM | POA: Diagnosis not present

## 2023-10-23 DIAGNOSIS — R918 Other nonspecific abnormal finding of lung field: Secondary | ICD-10-CM | POA: Diagnosis not present

## 2023-10-27 ENCOUNTER — Ambulatory Visit

## 2023-10-29 ENCOUNTER — Ambulatory Visit

## 2023-10-31 ENCOUNTER — Emergency Department
Admission: EM | Admit: 2023-10-31 | Discharge: 2023-10-31 | Disposition: A | Discharge status: Home / Self Care | Attending: Emergency Medicine | Admitting: Emergency Medicine

## 2023-10-31 ENCOUNTER — Emergency Department

## 2023-10-31 ENCOUNTER — Other Ambulatory Visit: Payer: Self-pay

## 2023-10-31 DIAGNOSIS — I10 Essential (primary) hypertension: Secondary | ICD-10-CM | POA: Insufficient documentation

## 2023-10-31 DIAGNOSIS — J069 Acute upper respiratory infection, unspecified: Secondary | ICD-10-CM | POA: Diagnosis not present

## 2023-10-31 DIAGNOSIS — R051 Acute cough: Secondary | ICD-10-CM

## 2023-10-31 DIAGNOSIS — R062 Wheezing: Secondary | ICD-10-CM | POA: Diagnosis not present

## 2023-10-31 DIAGNOSIS — R069 Unspecified abnormalities of breathing: Secondary | ICD-10-CM | POA: Diagnosis not present

## 2023-10-31 DIAGNOSIS — R531 Weakness: Secondary | ICD-10-CM | POA: Diagnosis not present

## 2023-10-31 DIAGNOSIS — R0602 Shortness of breath: Secondary | ICD-10-CM | POA: Diagnosis not present

## 2023-10-31 LAB — CBC WITH DIFFERENTIAL/PLATELET
Abs Immature Granulocytes: 0.08 10*3/uL — ABNORMAL HIGH (ref 0.00–0.07)
Basophils Absolute: 0.1 10*3/uL (ref 0.0–0.1)
Basophils Relative: 0 %
Eosinophils Absolute: 0.1 10*3/uL (ref 0.0–0.5)
Eosinophils Relative: 1 %
HCT: 38.2 % (ref 36.0–46.0)
Hemoglobin: 12.8 g/dL (ref 12.0–15.0)
Immature Granulocytes: 1 %
Lymphocytes Relative: 18 %
Lymphs Abs: 2.1 10*3/uL (ref 0.7–4.0)
MCH: 29.2 pg (ref 26.0–34.0)
MCHC: 33.5 g/dL (ref 30.0–36.0)
MCV: 87 fL (ref 80.0–100.0)
Monocytes Absolute: 1.3 10*3/uL — ABNORMAL HIGH (ref 0.1–1.0)
Monocytes Relative: 11 %
Neutro Abs: 7.9 10*3/uL — ABNORMAL HIGH (ref 1.7–7.7)
Neutrophils Relative %: 69 %
Platelets: 297 10*3/uL (ref 150–400)
RBC: 4.39 MIL/uL (ref 3.87–5.11)
RDW: 14.5 % (ref 11.5–15.5)
WBC: 11.6 10*3/uL — ABNORMAL HIGH (ref 4.0–10.5)
nRBC: 0 % (ref 0.0–0.2)

## 2023-10-31 LAB — COMPREHENSIVE METABOLIC PANEL WITH GFR
ALT: 13 U/L (ref 0–44)
AST: 20 U/L (ref 15–41)
Albumin: 3.4 g/dL — ABNORMAL LOW (ref 3.5–5.0)
Alkaline Phosphatase: 78 U/L (ref 38–126)
Anion gap: 9 (ref 5–15)
BUN: 18 mg/dL (ref 8–23)
CO2: 23 mmol/L (ref 22–32)
Calcium: 8.7 mg/dL — ABNORMAL LOW (ref 8.9–10.3)
Chloride: 105 mmol/L (ref 98–111)
Creatinine, Ser: 0.92 mg/dL (ref 0.44–1.00)
GFR, Estimated: >60 mL/min (ref >60)
Glucose, Bld: 100 mg/dL — ABNORMAL HIGH (ref 70–99)
Potassium: 3.6 mmol/L (ref 3.5–5.1)
Sodium: 137 mmol/L (ref 135–145)
Total Bilirubin: 0.4 mg/dL (ref 0.0–1.2)
Total Protein: 6.5 g/dL (ref 6.5–8.1)

## 2023-10-31 LAB — TROPONIN I (HIGH SENSITIVITY): Troponin I (High Sensitivity): 6 ng/L (ref <18)

## 2023-10-31 LAB — RESP PANEL BY RT-PCR (RSV, FLU A&B, COVID)  RVPGX2
Influenza A by PCR: NEGATIVE
Influenza B by PCR: NEGATIVE
Resp Syncytial Virus by PCR: NEGATIVE
SARS Coronavirus 2 by RT PCR: NEGATIVE

## 2023-10-31 LAB — BRAIN NATRIURETIC PEPTIDE: B Natriuretic Peptide: 79.3 pg/mL (ref 0.0–100.0)

## 2023-10-31 MED ORDER — GUAIFENESIN 200 MG PO TABS: 200.0000 mg | ORAL_TABLET | ORAL | 0 refills | Status: AC | PRN

## 2023-10-31 MED ORDER — SULFAMETHOXAZOLE-TRIMETHOPRIM 800-160 MG PO TABS: 1.0000 | ORAL_TABLET | Freq: Two times a day (BID) | ORAL | 0 refills | Status: AC

## 2023-10-31 MED ORDER — GUAIFENESIN 100 MG/5ML PO LIQD
5.0000 mL | Freq: Once | ORAL | Status: AC
  Administered 2023-10-31: 5 mL via ORAL
  Filled 2023-10-31: qty 10

## 2023-10-31 MED ORDER — ALBUTEROL SULFATE HFA 108 (90 BASE) MCG/ACT IN AERS: 2.0000 | INHALATION_SPRAY | Freq: Four times a day (QID) | RESPIRATORY_TRACT | 2 refills | Status: AC | PRN

## 2023-10-31 MED ORDER — AEROCHAMBER MV MISC: 0 refills | Status: AC

## 2023-10-31 MED ORDER — BENZONATATE 100 MG PO CAPS
200.0000 mg | ORAL_CAPSULE | Freq: Once | ORAL | Status: AC
  Administered 2023-10-31: 200 mg via ORAL
  Filled 2023-10-31: qty 2

## 2023-10-31 NOTE — ED Triage Notes (Signed)
 Pt BIB [facility] from home, pt complaining of generalized weakness for about a week states breathing getting worse. Was seen about a week ago and given abx. Pt stating did not get better while taking abx. EMS gave one DuoNeb. EMS reports afebrile but has chills.  120cbg - is diabetic

## 2023-10-31 NOTE — ED Provider Notes (Signed)
 [facility] Provider Note   Event Date/Time   First MD Initiated Contact with Patient 10/31/23 1643     (approximate) History  Shortness of Breath  HPI Diana Sullivan is a 88 y.o. female with a past medical history of prediabetes, hypertension, mitral valve prolapse, and muscular dystrophy who presents complaining of generalized weakness over the last week and worsening shortness of breath.  Patient states that she was seen here 1 week ago and given antibiotics as well as steroids with some improvement of her symptoms but states that she was never fully back to her baseline.  EMS gave 1 DuoNeb treatment as patient had wheezing on exam.  Patient does not feel any change in her symptoms after that treatment. ROS: Patient currently denies any vision changes, tinnitus, difficulty speaking, facial droop, sore throat, chest pain, abdominal pain, nausea/vomiting/diarrhea, dysuria, or numbness/paresthesias in any extremity   Physical Exam  Triage Vital Signs: ED Triage Vitals [10/31/23 1643]  Encounter Vitals Group     BP      Systolic BP Percentile      Diastolic BP Percentile      Pulse      Resp      Temp      Temp src      SpO2      Weight 165 lb 5.5 oz (75 kg)     Height 5\' 8"  (1.727 m)     Head Circumference      Peak Flow      Pain Score      Pain Loc      Pain Education      Exclude from Growth Chart    Most recent vital signs: Vitals:   10/31/23 1730 10/31/23 1905  BP: (!) 159/92 (!) 143/101  Pulse: 60 65  Resp: (!) 24 19  Temp:  98 F (36.7 C)  SpO2: 98% 96%   General: Awake, oriented x4. CV:  Good peripheral perfusion.  Resp:  Normal effort.  Clear to auscultation bilaterally Abd:  No distention.  Other:  Elderly overweight African-American female resting comfortably in no acute distress ED Results / Procedures / Treatments  Labs (all labs ordered are listed, but only abnormal results are displayed) Labs Reviewed  COMPREHENSIVE  METABOLIC PANEL WITH GFR - Abnormal; Notable for the following components:      Result Value   Glucose, Bld 100 (*)    Calcium 8.7 (*)    Albumin 3.4 (*)    All other components within normal limits  CBC WITH DIFFERENTIAL/PLATELET - Abnormal; Notable for the following components:   WBC 11.6 (*)    Neutro Abs 7.9 (*)    Monocytes Absolute 1.3 (*)    Abs Immature Granulocytes 0.08 (*)    All other components within normal limits  RESP PANEL BY RT-PCR (RSV, FLU A&B, COVID)  [id number]  BRAIN NATRIURETIC PEPTIDE  TROPONIN I (HIGH SENSITIVITY)   EKG ED ECG REPORT I, Charleen Conn, the attending physician, personally viewed and interpreted this ECG. Date: 10/31/2023 EKG Time: 1645 Rate: 82 Rhythm: normal sinus rhythm QRS Axis: normal Intervals: normal ST/T Wave abnormalities: normal Narrative Interpretation: no evidence of acute ischemia RADIOLOGY ED MD interpretation: One-view portable chest x-ray interpreted by me shows no evidence of acute abnormalities including no pneumonia, pneumothorax, or widened mediastinum -Agree with radiology assessment Official radiology report(s): No results found. PROCEDURES: Critical Care performed: No Procedures MEDICATIONS ORDERED IN ED: Medications  guaiFENesin (ROBITUSSIN) 100 MG/5ML liquid 5  mL (5 mLs Oral Given 10/31/23 1656)  benzonatate  (TESSALON ) capsule 200 mg (200 mg Oral Given 10/31/23 1655)   IMPRESSION / MDM / ASSESSMENT AND PLAN / ED COURSE  I reviewed the triage vital signs and the nursing notes.                             The patient is on the cardiac monitor to evaluate for evidence of arrhythmia and/or significant heart rate changes. Patient's presentation is most consistent with acute presentation with potential threat to life or bodily function. The patient is suffering from shortness of breath, but the immediate cause is not apparent.  Potential causes considered include, but are not limited to, asthma or COPD, congestive  heart failure, pulmonary embolism, pneumothorax, coronary syndrome, pneumonia, and pleural effusion.  Despite the evaluation including history, exam, and testing, the cause of the shortness of breath remains unclear. However, during the ED stay, patient's condition improved, and at the time of discharge the shortness of breath is resolved, they are feeling well, and want to go home.  Patient will be discharged with strict return precautions and advice to follow up with primary MD within 24 hours for further evaluation.   FINAL CLINICAL IMPRESSION(S) / ED DIAGNOSES   Final diagnoses:  Upper respiratory tract infection, unspecified type  Acute cough  Wheezing on both sides of chest   Rx / DC Orders   ED Discharge Orders          Ordered    albuterol (VENTOLIN HFA) 108 (90 Base) MCG/ACT inhaler  Every 6 hours PRN        10/31/23 1844    Spacer/Aero-Holding Chambers (AEROCHAMBER MV) inhaler        10/31/23 1844    sulfamethoxazole-trimethoprim (BACTRIM DS) 800-160 MG tablet  2 times daily        10/31/23 1844    guaiFENesin 200 MG tablet  Every 4 hours PRN        10/31/23 1844           Note:  This document was prepared using Dragon voice recognition software and may include unintentional dictation errors.   Layna Roeper K, MD 11/02/23 [phone]

## 2023-11-03 ENCOUNTER — Ambulatory Visit

## 2023-11-05 ENCOUNTER — Ambulatory Visit

## 2023-11-06 ENCOUNTER — Telehealth: Payer: Self-pay

## 2023-11-06 NOTE — Progress Notes (Signed)
   11/06/2023  Patient ID: Diana Sullivan, female   DOB: 1935-10-11, 88 y.o.   MRN: [id number]  Pharmacy Quality Measure Review  This patient is appearing on a report for being at risk of failing the adherence measure for hypertension (ACEi/ARB) medications this calendar year.   Medication: Valsartan -HCTZ Last fill date: 09/23/23 for 30 day supply  Spoke with patient. She confirms she has medication and is taking daily. Reports she had excess on hand at one point and was using that up. Has about 2 weeks left on meds and prefers to contact pharmacy when she gets lower. Will follow up in 2 weeks.  Carnell Christian, PharmD Clinical Pharmacist [phone]

## 2023-11-10 ENCOUNTER — Ambulatory Visit

## 2023-11-11 DIAGNOSIS — M2041 Other hammer toe(s) (acquired), right foot: Secondary | ICD-10-CM | POA: Diagnosis not present

## 2023-11-11 DIAGNOSIS — E119 Type 2 diabetes mellitus without complications: Secondary | ICD-10-CM | POA: Diagnosis not present

## 2023-11-11 DIAGNOSIS — M2042 Other hammer toe(s) (acquired), left foot: Secondary | ICD-10-CM | POA: Diagnosis not present

## 2023-11-11 DIAGNOSIS — M898X9 Other specified disorders of bone, unspecified site: Secondary | ICD-10-CM | POA: Diagnosis not present

## 2023-11-11 DIAGNOSIS — M2011 Hallux valgus (acquired), right foot: Secondary | ICD-10-CM | POA: Diagnosis not present

## 2023-11-11 DIAGNOSIS — M2012 Hallux valgus (acquired), left foot: Secondary | ICD-10-CM | POA: Diagnosis not present

## 2023-11-12 ENCOUNTER — Ambulatory Visit

## 2023-11-12 ENCOUNTER — Telehealth: Payer: Self-pay

## 2023-11-12 DIAGNOSIS — J209 Acute bronchitis, unspecified: Secondary | ICD-10-CM

## 2023-11-12 DIAGNOSIS — R042 Hemoptysis: Secondary | ICD-10-CM

## 2023-11-12 NOTE — Telephone Encounter (Signed)
 Patient called asking for referral to [facility] for Dr Antony Baumgartner

## 2023-11-16 ENCOUNTER — Ambulatory Visit

## 2023-11-18 ENCOUNTER — Ambulatory Visit

## 2023-11-18 NOTE — Addendum Note (Signed)
 Addended by: Vanda Gemma AHMAD on: 11/18/2023 09:57 AM   Modules accepted: Orders

## 2023-11-23 ENCOUNTER — Other Ambulatory Visit

## 2023-11-23 DIAGNOSIS — I10 Essential (primary) hypertension: Secondary | ICD-10-CM

## 2023-11-23 DIAGNOSIS — E782 Mixed hyperlipidemia: Secondary | ICD-10-CM | POA: Diagnosis not present

## 2023-11-24 ENCOUNTER — Ambulatory Visit: Admitting: Internal Medicine

## 2023-11-24 ENCOUNTER — Ambulatory Visit: Payer: Self-pay | Admitting: Internal Medicine

## 2023-11-24 ENCOUNTER — Ambulatory Visit (INDEPENDENT_AMBULATORY_CARE_PROVIDER_SITE_OTHER)

## 2023-11-24 VITALS — BP 130/90 | HR 75 | Temp 97.4°F | Ht 68.0 in | Wt 165.0 lb

## 2023-11-24 DIAGNOSIS — R7303 Prediabetes: Secondary | ICD-10-CM | POA: Diagnosis not present

## 2023-11-24 DIAGNOSIS — J301 Allergic rhinitis due to pollen: Secondary | ICD-10-CM | POA: Diagnosis not present

## 2023-11-24 DIAGNOSIS — I10 Essential (primary) hypertension: Secondary | ICD-10-CM

## 2023-11-24 DIAGNOSIS — E7800 Pure hypercholesterolemia, unspecified: Secondary | ICD-10-CM | POA: Diagnosis not present

## 2023-11-24 DIAGNOSIS — J209 Acute bronchitis, unspecified: Secondary | ICD-10-CM

## 2023-11-24 LAB — COMPREHENSIVE METABOLIC PANEL WITH GFR
ALT: 12 IU/L (ref 0–32)
AST: 23 IU/L (ref 0–40)
Albumin: 4 g/dL (ref 3.7–4.7)
Alkaline Phosphatase: 94 IU/L (ref 44–121)
BUN/Creatinine Ratio: 16 (ref 12–28)
BUN: 15 mg/dL (ref 8–27)
Bilirubin Total: 0.3 mg/dL (ref 0.0–1.2)
CO2: 18 mmol/L — ABNORMAL LOW (ref 20–29)
Calcium: 9.4 mg/dL (ref 8.7–10.3)
Chloride: 106 mmol/L (ref 96–106)
Creatinine, Ser: 0.95 mg/dL (ref 0.57–1.00)
Globulin, Total: 2.5 g/dL (ref 1.5–4.5)
Glucose: 95 mg/dL (ref 70–99)
Potassium: 4.4 mmol/L (ref 3.5–5.2)
Sodium: 140 mmol/L (ref 134–144)
Total Protein: 6.5 g/dL (ref 6.0–8.5)
eGFR: 59 mL/min/{1.73_m2} — ABNORMAL LOW (ref >59)

## 2023-11-24 LAB — LIPID PANEL
Chol/HDL Ratio: 3.2 ratio (ref 0.0–4.4)
Cholesterol, Total: 168 mg/dL (ref 100–199)
HDL: 53 mg/dL (ref >39)
LDL Chol Calc (NIH): 100 mg/dL — ABNORMAL HIGH (ref 0–99)
Triglycerides: 80 mg/dL (ref 0–149)
VLDL Cholesterol Cal: 15 mg/dL (ref 5–40)

## 2023-11-24 LAB — GLUCOSE, POCT (MANUAL RESULT ENTRY): POC Glucose: 98 mg/dL (ref 70–99)

## 2023-11-24 MED ORDER — BENZONATATE 100 MG PO CAPS: 100.0000 mg | ORAL_CAPSULE | Freq: Three times a day (TID) | ORAL | 0 refills | Status: AC | PRN

## 2023-11-24 MED ORDER — VALSARTAN-HYDROCHLOROTHIAZIDE 160-12.5 MG PO TABS
1.0000 | ORAL_TABLET | Freq: Every day | ORAL | 2 refills | Status: DC
Start: 2023-11-24 — End: 2023-12-16

## 2023-11-24 MED ORDER — FEXOFENADINE HCL 180 MG PO TABS
180.0000 mg | ORAL_TABLET | Freq: Every day | ORAL | 0 refills | Status: AC
Start: 2023-11-24 — End: 2024-02-22

## 2023-11-24 MED ORDER — METHYLPREDNISOLONE 4 MG PO TBPK: ORAL_TABLET | ORAL | 0 refills | Status: DC

## 2023-11-24 NOTE — Progress Notes (Signed)
 Established Patient Office Visit  Subjective:  Patient ID: Diana Sullivan, female    DOB: 07-Aug-1935  Age: 88 y.o. MRN: [id number]  Chief Complaint  Patient presents with   Follow-up    3 month lab results    No new complaints, here for lab review and medication refills. Still coughing sputum occasionally productive of brownish sputum but no further hemoptysis and scheduled to see pulmonary on 7/1.     No other concerns at this time.   Past Medical History:  Diagnosis Date   Anginal pain (HCC)    chest pain occasionally, checked out Dr [facility] (no Issues)   Arthritis    hands   Bronchitis    Complication of anesthesia    patient statedThey had trouble waking me up   Elevated lipids    Gout    Headache    once every 2 or 3 weeks   History of recent fall    shoulder and knee pain, left side   Hypertension    essential hypertension-controlled on meds   Mitral valve disorder    Muscular dystrophy (HCC)    Patient stated someone told her 25 years ago that she had the gene for it No issues   MVP (mitral valve prolapse)    Neuropathy    Pre-diabetes    watching diet   Wears partial dentures    upper and lower    Past Surgical History:  Procedure Laterality Date   ABDOMINAL HYSTERECTOMY     adenectomy     BACK SURGERY     BREAST SURGERY Right    biopsy, lumpectomy   BUNIONECTOMY Bilateral    CARDIAC CATHETERIZATION     no issue   CARPAL TUNNEL RELEASE Right    dr Huntley Mai   CATARACT EXTRACTION Left 12/06/2012   CATARACT EXTRACTION W/PHACO Right 11/14/2019   Procedure: CATARACT EXTRACTION PHACO AND INTRAOCULAR LENS PLACEMENT (IOC) RIGHT 4.76  00:44.4;  Surgeon: Rosa College, MD;  Location: [facility];  Service: Ophthalmology;  Laterality: Right;   COLONOSCOPY N/A 04/21/2016   Procedure: COLONOSCOPY;  Surgeon: Cassie Click, MD;  Location: [facility] ENDOSCOPY;  Service: Endoscopy;  Laterality: N/A;  Diabetic   COLONOSCOPY N/A  06/05/2021   Procedure: COLONOSCOPY;  Surgeon: Toledo, Alphonsus Jeans, MD;  Location: [facility] ENDOSCOPY;  Service: Gastroenterology;  Laterality: N/A;   EYE SURGERY     metatarsal osteotomy Left 03/18/2013   TONSILLECTOMY     TOTAL KNEE ARTHROPLASTY Left 12/31/2021   Procedure: LEFT TOTAL KNEE ARTHROPLASTY;  Surgeon: Arnie Lao, MD;  Location: [facility] OR;  Service: Orthopedics;  Laterality: Left;   TYMPANOSTOMY TUBE PLACEMENT      Social History   Socioeconomic History   Marital status: Divorced    Spouse name: Not on file   Number of children: Not on file   Years of education: Not on file   Highest education level: Not on file  Occupational History   Not on file  Tobacco Use   Smoking status: Former    Current packs/day: 0.00    Average packs/day: 0.3 packs/day for 27.0 years (6.8 ttl pk-yrs)    Types: Cigarettes    Start date: 04/20/1967    Quit date: 04/19/1994    Years since quitting: 29.6   Smokeless tobacco: Never   Tobacco comments:    QUIT 25 YEARS AGO  Vaping Use   Vaping status: Never Used  Substance and Sexual Activity   Alcohol use: Yes  Comment: SOCIAL   Drug use: Never   Sexual activity: Not on file  Other Topics Concern   Not on file  Social History Narrative   Not on file   Social Drivers of Health   Financial Resource Strain: Not on file  Food Insecurity: Not on file  Transportation Needs: Not on file  Physical Activity: Not on file  Stress: Not on file  Social Connections: Not on file  Intimate Partner Violence: Not on file    Family History  Problem Relation Age of Onset   Breast cancer Neg Hx    Neuropathy Neg Hx     Allergies  Allergen Reactions   Azithromycin Rash   Iodinated Contrast Media Itching and Swelling   Shellfish Allergy Itching and Swelling    Outpatient Medications Prior to Visit  Medication Sig   acetaminophen  (TYLENOL ) 500 MG tablet Take 500-1,000 mg by mouth every 6 (six) hours as needed for moderate pain.    albuterol  (VENTOLIN  HFA) 108 (90 Base) MCG/ACT inhaler Inhale 2 puffs into the lungs every 6 (six) hours as needed for wheezing or shortness of breath.   allopurinol  (ZYLOPRIM ) 100 MG tablet Take 1 tablet (100 mg total) by mouth daily.   colchicine  0.6 MG tablet Take 1 tablet (0.6 mg total) by mouth as directed. Take first dose then second dose 6 hrs later then one daily till pain free.   escitalopram (LEXAPRO) 5 MG tablet Take 5 mg by mouth daily.   fexofenadine  (ALLEGRA ) 180 MG tablet Take 1 tablet (180 mg total) by mouth daily for 5 days.   glucose blood (ONETOUCH ULTRA TEST) test strip Use as instructed   guaiFENesin  200 MG tablet Take 1 tablet (200 mg total) by mouth every 4 (four) hours as needed for cough or to loosen phlegm.   Lancet Devices (ONETOUCH DELICA PLUS LANCING) MISC 100 Lancets by Does not apply route 2 (two) times daily.   Omega-3 Fatty Acids (FISH OIL) 300 MG CAPS Take by mouth.   ONETOUCH VERIO test strip twice a day   Spacer/Aero-Holding Chambers (AEROCHAMBER MV) inhaler Use as instructed   [DISCONTINUED] fexofenadine  (ALLEGRA ) 180 MG tablet Take 1 tablet (180 mg total) by mouth daily.   [DISCONTINUED] valsartan -hydrochlorothiazide  (DIOVAN  HCT) 160-12.5 MG tablet Take 1 tablet by mouth daily.   No facility-administered medications prior to visit.    Review of Systems  Constitutional: Negative.   HENT: Negative.    Eyes: Negative.   Respiratory:  Positive for cough. Negative for sputum production and shortness of breath.   Cardiovascular: Negative.   Gastrointestinal: Negative.   Genitourinary: Negative.   Musculoskeletal:  Positive for joint pain.  Skin: Negative.   Neurological: Negative.   Endo/Heme/Allergies: Negative.   Psychiatric/Behavioral:  Positive for depression. The patient is nervous/anxious.   All other systems reviewed and are negative.      Objective:   BP (!) 130/90   Pulse 75   Temp (!) 97.4 F (36.3 C)   Ht 5' 8 (1.727 m)   Wt 165 lb  (74.8 kg)   SpO2 94%   BMI 25.09 kg/m   Vitals:   11/24/23 1253  BP: (!) 130/90  Pulse: 75  Temp: (!) 97.4 F (36.3 C)  Height: 5' 8 (1.727 m)  Weight: 165 lb (74.8 kg)  SpO2: 94%  BMI (Calculated): 25.09    Physical Exam Vitals reviewed.  Constitutional:      General: She is not in acute distress. HENT:     Head:  Normocephalic.     Nose: Nose normal.     Mouth/Throat:     Mouth: Mucous membranes are moist.   Eyes:     Extraocular Movements: Extraocular movements intact.     Pupils: Pupils are equal, round, and reactive to light.    Cardiovascular:     Rate and Rhythm: Normal rate and regular rhythm.     Heart sounds: No murmur heard. Pulmonary:     Effort: Pulmonary effort is normal.     Breath sounds: Rhonchi present. No rales.     Comments: Coughing spasm after deep inspiration for examination Abdominal:     General: Abdomen is flat.     Palpations: There is no hepatomegaly, splenomegaly or mass.   Musculoskeletal:        General: Swelling present. Normal range of motion.     Cervical back: Normal range of motion. Spasms (left paraspinal muscle) present. No tenderness.     Right foot: Swelling (around 1st metatarsal) present.   Skin:    General: Skin is warm and dry.   Neurological:     General: No focal deficit present.     Mental Status: She is alert and oriented to person, place, and time.     Cranial Nerves: No cranial nerve deficit.     Motor: No weakness.   Psychiatric:        Mood and Affect: Mood normal.        Behavior: Behavior normal.      Results for orders placed or performed in visit on 11/24/23  POCT Glucose (CBG)  Result Value Ref Range   POC Glucose 98 70 - 99 mg/dl    Recent Results (from the past 2160 hours)  Comprehensive metabolic panel     Status: Abnormal   Collection Time: 08/27/23 11:31 AM  Result Value Ref Range   Glucose 96 70 - 99 mg/dL   BUN 23 8 - 27 mg/dL   Creatinine, Ser 5.78 0.57 - 1.00 mg/dL   eGFR 59  (L) >46 NG/EXB/2.84   BUN/Creatinine Ratio 24 12 - 28   Sodium 144 134 - 144 mmol/L   Potassium 5.1 3.5 - 5.2 mmol/L   Chloride 107 (H) 96 - 106 mmol/L   CO2 22 20 - 29 mmol/L   Calcium 9.6 8.7 - 10.3 mg/dL   Total Protein 6.6 6.0 - 8.5 g/dL   Albumin 4.2 3.7 - 4.7 g/dL   Globulin, Total 2.4 1.5 - 4.5 g/dL   Bilirubin Total 0.3 0.0 - 1.2 mg/dL   Alkaline Phosphatase 100 44 - 121 IU/L   AST 21 0 - 40 IU/L   ALT 12 0 - 32 IU/L  Lipid panel     Status: Abnormal   Collection Time: 08/27/23 11:31 AM  Result Value Ref Range   Cholesterol, Total 184 100 - 199 mg/dL   Triglycerides 58 0 - 149 mg/dL   HDL 65 >13 mg/dL   VLDL Cholesterol Cal 11 5 - 40 mg/dL   LDL Chol Calc (NIH) 244 (H) 0 - 99 mg/dL   Chol/HDL Ratio 2.8 0.0 - 4.4 ratio    Comment:                                   T. Chol/HDL Ratio  Men  Women                               1/2 Avg.Risk  3.4    3.3                                   Avg.Risk  5.0    4.4                                2X Avg.Risk  9.6    7.1                                3X Avg.Risk 23.4   11.0   Hemoglobin A1c     Status: Abnormal   Collection Time: 08/27/23 11:31 AM  Result Value Ref Range   Hgb A1c MFr Bld 6.3 (H) 4.8 - 5.6 %    Comment:          Prediabetes: 5.7 - 6.4          Diabetes: >6.4          Glycemic control for adults with diabetes: <7.0    Est. average glucose Bld gHb Est-mCnc 134 mg/dL  POCT CBG (Fasting - Glucose)     Status: Abnormal   Collection Time: 10/21/23 10:39 AM  Result Value Ref Range   Glucose Fasting, POC 110 (A) 70 - 99 mg/dL  POCT rapid strep A     Status: None   Collection Time: 10/21/23 11:19 AM  Result Value Ref Range   Rapid Strep A Screen Negative Negative  POCT XPERT XPRESS SARS COVID-2/FLU/RSV [[id number]]     Status: None   Collection Time: 10/21/23 12:12 PM  Result Value Ref Range   SARS Coronavirus 2 Negative    FLU A Negative    FLU B Negative    RSV RNA, PCR  Negative   Resp panel by RT-PCR (RSV, Flu A&B, Covid) Anterior Nasal Swab     Status: None   Collection Time: 10/31/23  4:17 PM   Specimen: Anterior Nasal Swab  Result Value Ref Range   SARS Coronavirus 2 by RT PCR NEGATIVE NEGATIVE    Comment: (NOTE) SARS-CoV-2 target nucleic acids are NOT DETECTED.  The SARS-CoV-2 RNA is generally detectable in upper respiratory specimens during the acute phase of infection. The lowest concentration of SARS-CoV-2 viral copies this assay can detect is 138 copies/mL. A negative result does not preclude SARS-Cov-2 infection and should not be used as the sole basis for treatment or other patient management decisions. A negative result may occur with  improper specimen collection/handling, submission of specimen other than nasopharyngeal swab, presence of viral mutation(Karianne Nogueira) within the areas targeted by this assay, and inadequate number of viral copies(<138 copies/mL). A negative result must be combined with clinical observations, patient history, and epidemiological information. The expected result is Negative.  Fact Sheet for Patients:  BloggerCourse.com  Fact Sheet for Healthcare Providers:  SeriousBroker.it  This test is no t yet approved or cleared by the [place]  FDA and  has been authorized for detection and/or diagnosis of SARS-CoV-2 by FDA under an Emergency Use Authorization (EUA). This EUA will remain  in effect (meaning this test can be used) for the duration of the  COVID-19 declaration under Section 564(b)(1) of the Act, 21 U.Jonique Kulig.C.section 360bbb-3(b)(1), unless the authorization is terminated  or revoked sooner.       Influenza A by PCR NEGATIVE NEGATIVE   Influenza B by PCR NEGATIVE NEGATIVE    Comment: (NOTE) The Xpert Xpress SARS-CoV-2/FLU/RSV plus assay is intended as an aid in the diagnosis of influenza from Nasopharyngeal swab specimens and should not be used as a sole basis  for treatment. Nasal washings and aspirates are unacceptable for Xpert Xpress SARS-CoV-2/FLU/RSV testing.  Fact Sheet for Patients: BloggerCourse.com  Fact Sheet for Healthcare Providers: SeriousBroker.it  This test is not yet approved or cleared by the [place]  FDA and has been authorized for detection and/or diagnosis of SARS-CoV-2 by FDA under an Emergency Use Authorization (EUA). This EUA will remain in effect (meaning this test can be used) for the duration of the COVID-19 declaration under Section 564(b)(1) of the Act, 21 U.Amardeep Beckers.C. section 360bbb-3(b)(1), unless the authorization is terminated or revoked.     Resp Syncytial Virus by PCR NEGATIVE NEGATIVE    Comment: (NOTE) Fact Sheet for Patients: BloggerCourse.com  Fact Sheet for Healthcare Providers: SeriousBroker.it  This test is not yet approved or cleared by the [place]  FDA and has been authorized for detection and/or diagnosis of SARS-CoV-2 by FDA under an Emergency Use Authorization (EUA). This EUA will remain in effect (meaning this test can be used) for the duration of the COVID-19 declaration under Section 564(b)(1) of the Act, 21 U.Mamoru Takeshita.C. section 360bbb-3(b)(1), unless the authorization is terminated or revoked.  Performed at [facility], [address] [place], [place] [address]   Comprehensive metabolic panel     Status: Abnormal   Collection Time: 10/31/23  4:41 PM  Result Value Ref Range   Sodium 137 135 - 145 mmol/L   Potassium 3.6 3.5 - 5.1 mmol/L   Chloride 105 98 - 111 mmol/L   CO2 23 22 - 32 mmol/L   Glucose, Bld 100 (H) 70 - 99 mg/dL    Comment: Glucose reference range applies only to samples taken after fasting for at least 8 hours.   BUN 18 8 - 23 mg/dL   Creatinine, Ser 2.95 0.44 - 1.00 mg/dL   Calcium 8.7 (L) 8.9 - 10.3 mg/dL   Total Protein 6.5 6.5 - 8.1 g/dL   Albumin  3.4 (L) 3.5 - 5.0 g/dL   AST 20 15 - 41 U/L   ALT 13 0 - 44 U/L   Alkaline Phosphatase 78 38 - 126 U/L   Total Bilirubin 0.4 0.0 - 1.2 mg/dL   GFR, Estimated >18 >84 mL/min    Comment: (NOTE) Calculated using the CKD-EPI Creatinine Equation (2021)    Anion gap 9 5 - 15    Comment: Performed at [facility], [address] [place], [place] [address]  Brain natriuretic peptide     Status: None   Collection Time: 10/31/23  4:41 PM  Result Value Ref Range   B Natriuretic Peptide 79.3 0.0 - 100.0 pg/mL    Comment: Performed at [facility], [address], [place], [place] [address]  Troponin I (High Sensitivity)     Status: None   Collection Time: 10/31/23  4:41 PM  Result Value Ref Range   Troponin I (High Sensitivity) 6 <18 ng/L    Comment: (NOTE) Elevated high sensitivity troponin I (hsTnI) values and significant  changes across serial measurements may suggest ACS but many other  chronic and acute conditions are known  to elevate hsTnI results.  Refer to the Links section for chest pain algorithms and additional  guidance. Performed at [facility], [address] [place], [place] [address]   CBC with Differential     Status: Abnormal   Collection Time: 10/31/23  4:41 PM  Result Value Ref Range   WBC 11.6 (H) 4.0 - 10.5 K/uL   RBC 4.39 3.87 - 5.11 MIL/uL   Hemoglobin 12.8 12.0 - 15.0 g/dL   HCT 19.1 47.8 - 29.5 %   MCV 87.0 80.0 - 100.0 fL   MCH 29.2 26.0 - 34.0 pg   MCHC 33.5 30.0 - 36.0 g/dL   RDW 62.1 30.8 - 65.7 %   Platelets 297 150 - 400 K/uL   nRBC 0.0 0.0 - 0.2 %   Neutrophils Relative % 69 %   Neutro Abs 7.9 (H) 1.7 - 7.7 K/uL   Lymphocytes Relative 18 %   Lymphs Abs 2.1 0.7 - 4.0 K/uL   Monocytes Relative 11 %   Monocytes Absolute 1.3 (H) 0.1 - 1.0 K/uL   Eosinophils Relative 1 %   Eosinophils Absolute 0.1 0.0 - 0.5 K/uL   Basophils Relative 0 %   Basophils Absolute 0.1 0.0 - 0.1 K/uL   Immature Granulocytes 1 %   Abs  Immature Granulocytes 0.08 (H) 0.00 - 0.07 K/uL    Comment: Performed at [facility], [address] [place], [place] [address]  Lipid panel     Status: Abnormal   Collection Time: 11/23/23  9:48 AM  Result Value Ref Range   Cholesterol, Total 168 100 - 199 mg/dL   Triglycerides 80 0 - 149 mg/dL   HDL 53 >29 mg/dL   VLDL Cholesterol Cal 15 5 - 40 mg/dL   LDL Chol Calc (NIH) 528 (H) 0 - 99 mg/dL   Chol/HDL Ratio 3.2 0.0 - 4.4 ratio    Comment:                                   T. Chol/HDL Ratio                                             Men  Women                               1/2 Avg.Risk  3.4    3.3                                   Avg.Risk  5.0    4.4                                2X Avg.Risk  9.6    7.1                                3X Avg.Risk 23.4   11.0   Comprehensive metabolic panel     Status: Abnormal   Collection Time: 11/23/23  9:48 AM  Result Value Ref Range   Glucose 95 70 - 99 mg/dL  BUN 15 8 - 27 mg/dL   Creatinine, Ser 2.95 0.57 - 1.00 mg/dL   eGFR 59 (L) >62 ZH/YQM/5.78   BUN/Creatinine Ratio 16 12 - 28   Sodium 140 134 - 144 mmol/L   Potassium 4.4 3.5 - 5.2 mmol/L   Chloride 106 96 - 106 mmol/L   CO2 18 (L) 20 - 29 mmol/L   Calcium 9.4 8.7 - 10.3 mg/dL   Total Protein 6.5 6.0 - 8.5 g/dL   Albumin 4.0 3.7 - 4.7 g/dL   Globulin, Total 2.5 1.5 - 4.5 g/dL   Bilirubin Total 0.3 0.0 - 1.2 mg/dL   Alkaline Phosphatase 94 44 - 121 IU/L   AST 23 0 - 40 IU/L   ALT 12 0 - 32 IU/L  POCT Glucose (CBG)     Status: Normal   Collection Time: 11/24/23  1:00 PM  Result Value Ref Range   POC Glucose 98 70 - 99 mg/dl      Assessment & Plan:  As per problem list.  Problem List Items Addressed This Visit       Cardiovascular and Mediastinum   Benign essential hypertension   Relevant Medications   valsartan -hydrochlorothiazide  (DIOVAN  HCT) 160-12.5 MG tablet     Respiratory   Seasonal allergic rhinitis due to pollen   Relevant Medications    fexofenadine  (ALLEGRA ) 180 MG tablet     Other   Prediabetes - Primary   Relevant Orders   POCT Glucose (CBG) (Completed)   Other Visit Diagnoses       Acute bronchitis, unspecified organism       Relevant Medications   benzonatate  (TESSALON  PERLES) 100 MG capsule   methylPREDNISolone  (MEDROL  DOSEPAK) 4 MG TBPK tablet   Other Relevant Orders   DG Chest 2 View       Return in about 3 months (around 02/24/2024) for fu with labs prior.   Total time spent: 20 minutes  Arzella Bitters, MD  11/24/2023   This document may have been prepared by [facility] Voice Recognition software and as such may include unintentional dictation errors.

## 2023-11-27 NOTE — Progress Notes (Signed)
   11/27/2023  Patient ID: Diana Sullivan, female   DOB: Jun 19, 1935, 88 y.o.   MRN: [id number]  Pharmacy Quality Measure Review  This patient is appearing on a report for being at risk of failing the adherence measure for hypertension (ACEi/ARB) medications this calendar year.   Medication: Valsartan -HCTZ Last fill date: 11/24/23 for 30 day supply  Insurance report not up to date. No further action needed at this time.   Carnell Christian, PharmD Clinical Pharmacist [phone]

## 2023-12-08 DIAGNOSIS — R058 Other specified cough: Secondary | ICD-10-CM | POA: Diagnosis not present

## 2023-12-16 ENCOUNTER — Ambulatory Visit: Admitting: Internal Medicine

## 2023-12-16 ENCOUNTER — Encounter: Payer: Self-pay | Admitting: Internal Medicine

## 2023-12-16 ENCOUNTER — Ambulatory Visit: Payer: Self-pay | Admitting: Internal Medicine

## 2023-12-16 VITALS — BP 122/82 | HR 79 | Ht 68.0 in | Wt 163.6 lb

## 2023-12-16 DIAGNOSIS — N3001 Acute cystitis with hematuria: Secondary | ICD-10-CM

## 2023-12-16 DIAGNOSIS — Z0130 Encounter for examination of blood pressure without abnormal findings: Secondary | ICD-10-CM

## 2023-12-16 DIAGNOSIS — E119 Type 2 diabetes mellitus without complications: Secondary | ICD-10-CM

## 2023-12-16 LAB — POCT URINALYSIS DIPSTICK
Bilirubin, UA: NEGATIVE
Glucose, UA: NEGATIVE
Ketones, UA: NEGATIVE
Nitrite, UA: NEGATIVE
Protein, UA: POSITIVE — AB
Spec Grav, UA: 1.015 (ref 1.010–1.025)
Urobilinogen, UA: 0.2 U/dL
pH, UA: 5.5 (ref 5.0–8.0)

## 2023-12-16 LAB — GLUCOSE, POCT (MANUAL RESULT ENTRY): POC Glucose: 98 mg/dL (ref 70–99)

## 2023-12-16 LAB — POC CREATINE & ALBUMIN,URINE
Creatinine, POC: 200 mg/dL
Microalbumin Ur, POC: 150 mg/L

## 2023-12-16 MED ORDER — PROBIOTIC (LACTOBACILLUS) PO CAPS: 1.0000 | ORAL_CAPSULE | Freq: Three times a day (TID) | ORAL | 0 refills | Status: AC

## 2023-12-16 MED ORDER — PHENAZOPYRIDINE HCL 100 MG PO TABS: 100.0000 mg | ORAL_TABLET | Freq: Three times a day (TID) | ORAL | 0 refills | Status: AC | PRN

## 2023-12-16 MED ORDER — CIPROFLOXACIN HCL 500 MG PO TABS: 500.0000 mg | ORAL_TABLET | Freq: Two times a day (BID) | ORAL | 0 refills | Status: AC

## 2023-12-16 NOTE — Progress Notes (Signed)
 Established Patient Office Visit  Subjective:  Patient ID: Diana Sullivan, female    DOB: [birth year]  Age: 88 y.o. MRN: [id number]  Chief Complaint  Patient presents with   Acute Visit    Possible bladder infection    SUBJECTIVE: Diana Sullivan is a 88 y.o. female who complains of urinary frequency, urgency and dysuria x 2-3 days, without flank pain or suprapubic pain but c/o blood in her urine. Increased fluid intake and denies use of cranberry.      No other concerns at this time.   Past Medical History:  Diagnosis Date   Anginal pain (HCC)    chest pain occasionally, checked out [facility] (no Issues)   Arthritis    hands   Bronchitis    Complication of anesthesia    patient statedThey had trouble waking me up   Elevated lipids    Gout    Headache    once every 2 or 3 weeks   History of recent fall    shoulder and knee pain, left side   Hypertension    essential hypertension-controlled on meds   Mitral valve disorder    Muscular dystrophy (HCC)    Patient stated someone told her 25 years ago that she had the gene for it No issues   MVP (mitral valve prolapse)    Neuropathy    Pre-diabetes    watching diet   Wears partial dentures    upper and lower    Past Surgical History:  Procedure Laterality Date   ABDOMINAL HYSTERECTOMY     adenectomy     BACK SURGERY     BREAST SURGERY Right    biopsy, lumpectomy   BUNIONECTOMY Bilateral    CARDIAC CATHETERIZATION     no issue   CARPAL TUNNEL RELEASE Right    dr murrell   CATARACT EXTRACTION Left 12/06/2012   CATARACT EXTRACTION W/PHACO Right 11/14/2019   Procedure: CATARACT EXTRACTION PHACO AND INTRAOCULAR LENS PLACEMENT (IOC) RIGHT 4.76  00:44.4;  Surgeon: Myrna Adine Anes, MD;  Location: [facility];  Service: Ophthalmology;  Laterality: Right;   COLONOSCOPY N/A 04/21/2016   Procedure: COLONOSCOPY;  Surgeon: Lamar ONEIDA Holmes, MD;  Location: [facility];  Service: Endoscopy;   Laterality: N/A;  Diabetic   COLONOSCOPY N/A 06/05/2021   Procedure: COLONOSCOPY;  Surgeon: Toledo, Ladell POUR, MD;  Location: [facility] ENDOSCOPY;  Service: Gastroenterology;  Laterality: N/A;   EYE SURGERY     metatarsal osteotomy Left 03/18/2013   TONSILLECTOMY     TOTAL KNEE ARTHROPLASTY Left 12/31/2021   Procedure: LEFT TOTAL KNEE ARTHROPLASTY;  Surgeon: Vernetta Lonni GRADE, MD;  Location: [facility] OR;  Service: Orthopedics;  Laterality: Left;   TYMPANOSTOMY TUBE PLACEMENT      Social History   Socioeconomic History   Marital status: Divorced    Spouse name: Not on file   Number of children: Not on file   Years of education: Not on file   Highest education level: Not on file  Occupational History   Not on file  Tobacco Use   Smoking status: Former    Current packs/day: 0.00    Average packs/day: 0.3 packs/day for 27.0 years (6.8 ttl pk-yrs)    Types: Cigarettes    Start date: 04/20/1967    Quit date: 04/19/1994    Years since quitting: 29.6   Smokeless tobacco: Never   Tobacco comments:    QUIT 25 YEARS AGO  Vaping Use   Vaping status: Never Used  Substance and Sexual Activity   Alcohol use: Yes    Comment: SOCIAL   Drug use: Never   Sexual activity: Not on file  Other Topics Concern   Not on file  Social History Narrative   Not on file   Social Drivers of Health   Financial Resource Strain: Not on file  Food Insecurity: Not on file  Transportation Needs: Not on file  Physical Activity: Not on file  Stress: Not on file  Social Connections: Not on file  Intimate Partner Violence: Not on file    Family History  Problem Relation Age of Onset   Breast cancer Neg Hx    Neuropathy Neg Hx     Allergies  Allergen Reactions   Azithromycin Rash   Iodinated Contrast Media Itching and Swelling   Shellfish Allergy Itching and Swelling    Outpatient Medications Prior to Visit  Medication Sig   albuterol  (VENTOLIN  HFA) 108 (90 Base) MCG/ACT inhaler Inhale 2 puffs  into the lungs every 6 (six) hours as needed for wheezing or shortness of breath.   fexofenadine  (ALLEGRA ) 180 MG tablet Take 1 tablet (180 mg total) by mouth daily.   glucose blood (ONETOUCH ULTRA TEST) test strip Use as instructed   Lancet Devices (ONETOUCH DELICA PLUS LANCING) MISC 100 Lancets by Does not apply route 2 (two) times daily.   olmesartan  (BENICAR ) 5 MG tablet Take 5 mg by mouth daily.   ONETOUCH VERIO test strip twice a day   Spacer/Aero-Holding Chambers (AEROCHAMBER MV) inhaler Use as instructed   acetaminophen  (TYLENOL ) 500 MG tablet Take 500-1,000 mg by mouth every 6 (six) hours as needed for moderate pain. (Patient not taking: Reported on 12/16/2023)   allopurinol  (ZYLOPRIM ) 100 MG tablet Take 1 tablet (100 mg total) by mouth daily. (Patient not taking: Reported on 12/16/2023)   colchicine  0.6 MG tablet Take 1 tablet (0.6 mg total) by mouth as directed. Take first dose then second dose 6 hrs later then one daily till pain free. (Patient not taking: Reported on 12/16/2023)   escitalopram (LEXAPRO) 5 MG tablet Take 5 mg by mouth daily. (Patient not taking: Reported on 12/16/2023)   fexofenadine  (ALLEGRA ) 180 MG tablet Take 1 tablet (180 mg total) by mouth daily for 5 days. (Patient not taking: Reported on 12/16/2023)   guaiFENesin  200 MG tablet Take 1 tablet (200 mg total) by mouth every 4 (four) hours as needed for cough or to loosen phlegm. (Patient not taking: Reported on 12/16/2023)   methylPREDNISolone  (MEDROL  DOSEPAK) 4 MG TBPK tablet Use as directed (Patient not taking: Reported on 12/16/2023)   Omega-3 Fatty Acids (FISH OIL) 300 MG CAPS Take by mouth. (Patient not taking: Reported on 12/16/2023)   [DISCONTINUED] valsartan -hydrochlorothiazide  (DIOVAN  HCT) 160-12.5 MG tablet Take 1 tablet by mouth daily. (Patient not taking: Reported on 12/16/2023)   No facility-administered medications prior to visit.    Review of Systems  All other systems reviewed and are negative.      Objective:    BP 122/82   Pulse 79   Ht 5' 8 (1.727 m)   Wt 163 lb 9.6 oz (74.2 kg)   SpO2 97%   BMI 24.88 kg/m   Vitals:   12/16/23 0910  BP: 122/82  Pulse: 79  Height: 5' 8 (1.727 m)  Weight: 163 lb 9.6 oz (74.2 kg)  SpO2: 97%  BMI (Calculated): 24.88    Physical Exam Vitals reviewed.  Constitutional:      General: She is not in acute  distress. HENT:     Head: Normocephalic.     Nose: Nose normal.     Mouth/Throat:     Mouth: Mucous membranes are moist.  Eyes:     Extraocular Movements: Extraocular movements intact.     Pupils: Pupils are equal, round, and reactive to light.  Cardiovascular:     Rate and Rhythm: Normal rate and regular rhythm.     Heart sounds: No murmur heard. Pulmonary:     Effort: Pulmonary effort is normal.     Breath sounds: No rales.     Comments: Coughing spasm after deep inspiration for examination Abdominal:     General: Abdomen is flat.     Palpations: There is no hepatomegaly, splenomegaly or mass.     Tenderness: There is no abdominal tenderness.  Musculoskeletal:        General: Swelling present. Normal range of motion.     Cervical back: Normal range of motion. Spasms (left paraspinal muscle) present. No tenderness.     Right foot: Swelling (around 1st metatarsal) present.  Skin:    General: Skin is warm and dry.  Neurological:     General: No focal deficit present.     Mental Status: She is alert and oriented to person, place, and time.     Cranial Nerves: No cranial nerve deficit.     Motor: No weakness.  Psychiatric:        Mood and Affect: Mood normal.        Behavior: Behavior normal.      Results for orders placed or performed in visit on 12/16/23  POCT Glucose (CBG)  Result Value Ref Range   POC Glucose 98 70 - 99 mg/dl  POCT Urinalysis Dipstick (81002)  Result Value Ref Range   Color, UA deep yellow    Clarity, UA cloudy    Glucose, UA Negative Negative   Bilirubin, UA negative    Ketones, UA negative    Spec Grav,  UA 1.015 1.010 - 1.025   Blood, UA Postivie    pH, UA 5.5 5.0 - 8.0   Protein, UA Positive (A) Negative   Urobilinogen, UA 0.2 0.2 or 1.0 E.U./dL   Nitrite, UA negative    Leukocytes, UA Moderate (2+) (A) Negative   Appearance cloudy    Odor yes   POC CREATINE & ALBUMIN,URINE  Result Value Ref Range   Microalbumin Ur, POC 150 mg/L   Creatinine, POC 200 mg/dL   Albumin/Creatinine Ratio, Urine, POC 30-300     Urine dipstick shows positive for RBC'Anvith Mauriello, positive for protein, and positive for leukocytes.  Micro exam: not done.     Assessment & Plan:  As per problem list. Problem List Items Addressed This Visit   None Visit Diagnoses       Type 2 diabetes mellitus without complication, without long-term current use of insulin (HCC)    -  Primary   Relevant Medications   olmesartan  (BENICAR ) 5 MG tablet   Other Relevant Orders   POCT Glucose (CBG) (Completed)   POC CREATINE & ALBUMIN,URINE (Completed)     Acute cystitis with hematuria       Relevant Orders   POCT Urinalysis Dipstick (18997) (Completed)       Return if symptoms worsen or fail to improve.   Total time spent: 30 minutes  Sherrill Cinderella Perry, MD  12/16/2023   This document may have been prepared by [facility] Voice Recognition software and as such may include unintentional dictation errors.

## 2023-12-18 LAB — URINE CULTURE

## 2024-01-07 DIAGNOSIS — J301 Allergic rhinitis due to pollen: Secondary | ICD-10-CM | POA: Diagnosis not present

## 2024-01-07 DIAGNOSIS — J45991 Cough variant asthma: Secondary | ICD-10-CM | POA: Diagnosis not present

## 2024-01-18 ENCOUNTER — Ambulatory Visit
Admission: RE | Admit: 2024-01-18 | Discharge: 2024-01-18 | Disposition: A | Discharge status: Home / Self Care | Source: Ambulatory Visit | Attending: Internal Medicine | Admitting: Internal Medicine

## 2024-01-18 ENCOUNTER — Ambulatory Visit: Payer: Self-pay | Admitting: Internal Medicine

## 2024-01-18 ENCOUNTER — Encounter: Payer: Self-pay | Admitting: Internal Medicine

## 2024-01-18 ENCOUNTER — Ambulatory Visit: Admitting: Internal Medicine

## 2024-01-18 VITALS — BP 140/80 | HR 85 | Temp 101.3°F | Ht 68.0 in | Wt 163.8 lb

## 2024-01-18 DIAGNOSIS — J209 Acute bronchitis, unspecified: Secondary | ICD-10-CM | POA: Diagnosis not present

## 2024-01-18 DIAGNOSIS — R059 Cough, unspecified: Secondary | ICD-10-CM | POA: Diagnosis not present

## 2024-01-18 DIAGNOSIS — R918 Other nonspecific abnormal finding of lung field: Secondary | ICD-10-CM | POA: Diagnosis not present

## 2024-01-18 DIAGNOSIS — M129 Arthropathy, unspecified: Secondary | ICD-10-CM | POA: Diagnosis not present

## 2024-01-18 DIAGNOSIS — R0602 Shortness of breath: Secondary | ICD-10-CM | POA: Diagnosis not present

## 2024-01-18 DIAGNOSIS — Z0130 Encounter for examination of blood pressure without abnormal findings: Secondary | ICD-10-CM

## 2024-01-18 LAB — POCT XPERT XPRESS SARS COVID-2/FLU/RSV
FLU A: NEGATIVE
FLU B: NEGATIVE
RSV RNA, PCR: NEGATIVE
SARS Coronavirus 2: NEGATIVE

## 2024-01-18 MED ORDER — DOXYCYCLINE HYCLATE 100 MG PO TABS: 100.0000 mg | ORAL_TABLET | Freq: Two times a day (BID) | ORAL | 0 refills | Status: AC

## 2024-01-18 MED ORDER — PREDNISONE 20 MG PO TABS: 40.0000 mg | ORAL_TABLET | Freq: Every day | ORAL | 0 refills | Status: AC

## 2024-01-18 NOTE — Addendum Note (Signed)
 Addended by: GLADIS HOWARD on: 01/18/2024 03:18 PM   Modules accepted: Orders

## 2024-01-18 NOTE — Progress Notes (Signed)
 Established Patient Office Visit  Subjective:  Patient ID: Diana Sullivan, female    DOB: 1936-01-01  Age: 88 y.o. MRN: [id number]  Chief Complaint  Patient presents with   Acute Visit    Congestion,cough, fever    C/o fever, worsening cough and sob x 3 days. Unable to be seen at [facility] as they were all closed.Cough is productive of yellow tinged white sputum.    No other concerns at this time.   Past Medical History:  Diagnosis Date   Anginal pain (HCC)    chest pain occasionally, checked out [facility] (no Issues)   Arthritis    hands   Bronchitis    Complication of anesthesia    patient statedThey had trouble waking me up   Elevated lipids    Gout    Headache    once every 2 or 3 weeks   History of recent fall    shoulder and knee pain, left side   Hypertension    essential hypertension-controlled on meds   Mitral valve disorder    Muscular dystrophy (HCC)    Patient stated someone told her 25 years ago that she had the gene for it No issues   MVP (mitral valve prolapse)    Neuropathy    Pre-diabetes    watching diet   Wears partial dentures    upper and lower    Past Surgical History:  Procedure Laterality Date   ABDOMINAL HYSTERECTOMY     adenectomy     BACK SURGERY     BREAST SURGERY Right    biopsy, lumpectomy   BUNIONECTOMY Bilateral    CARDIAC CATHETERIZATION     no issue   CARPAL TUNNEL RELEASE Right    dr murrell   CATARACT EXTRACTION Left 12/06/2012   CATARACT EXTRACTION W/PHACO Right 11/14/2019   Procedure: CATARACT EXTRACTION PHACO AND INTRAOCULAR LENS PLACEMENT (IOC) RIGHT 4.76  00:44.4;  Surgeon: Myrna Adine Anes, MD;  Location: [facility];  Service: Ophthalmology;  Laterality: Right;   COLONOSCOPY N/A 04/21/2016   Procedure: COLONOSCOPY;  Surgeon: Lamar ONEIDA Holmes, MD;  Location: [facility] ENDOSCOPY;  Service: Endoscopy;  Laterality: N/A;  Diabetic   COLONOSCOPY N/A 06/05/2021   Procedure: COLONOSCOPY;   Surgeon: Toledo, Ladell POUR, MD;  Location: [facility] ENDOSCOPY;  Service: Gastroenterology;  Laterality: N/A;   EYE SURGERY     metatarsal osteotomy Left 03/18/2013   TONSILLECTOMY     TOTAL KNEE ARTHROPLASTY Left 12/31/2021   Procedure: LEFT TOTAL KNEE ARTHROPLASTY;  Surgeon: Vernetta Lonni GRADE, MD;  Location: [facility] OR;  Service: Orthopedics;  Laterality: Left;   TYMPANOSTOMY TUBE PLACEMENT      Social History   Socioeconomic History   Marital status: Divorced    Spouse name: Not on file   Number of children: Not on file   Years of education: Not on file   Highest education level: Not on file  Occupational History   Not on file  Tobacco Use   Smoking status: Former    Current packs/day: 0.00    Average packs/day: 0.3 packs/day for 27.0 years (6.8 ttl pk-yrs)    Types: Cigarettes    Start date: 04/20/1967    Quit date: 04/19/1994    Years since quitting: 29.7   Smokeless tobacco: Never   Tobacco comments:    QUIT 25 YEARS AGO  Vaping Use   Vaping status: Never Used  Substance and Sexual Activity   Alcohol use: Yes    Comment: SOCIAL  Drug use: Never   Sexual activity: Not on file  Other Topics Concern   Not on file  Social History Narrative   Not on file   Social Drivers of Health   Financial Resource Strain: Not on file  Food Insecurity: Not on file  Transportation Needs: Not on file  Physical Activity: Not on file  Stress: Not on file  Social Connections: Not on file  Intimate Partner Violence: Not on file    Family History  Problem Relation Age of Onset   Breast cancer Neg Hx    Neuropathy Neg Hx     Allergies  Allergen Reactions   Azithromycin Rash   Iodinated Contrast Media Itching and Swelling   Shellfish Allergy Itching and Swelling    Outpatient Medications Prior to Visit  Medication Sig   acetaminophen  (TYLENOL ) 500 MG tablet Take 500-1,000 mg by mouth every 6 (six) hours as needed for moderate pain.   albuterol  (VENTOLIN  HFA) 108 (90 Base)  MCG/ACT inhaler Inhale 2 puffs into the lungs every 6 (six) hours as needed for wheezing or shortness of breath.   allopurinol  (ZYLOPRIM ) 100 MG tablet Take 1 tablet (100 mg total) by mouth daily.   colchicine  0.6 MG tablet Take 1 tablet (0.6 mg total) by mouth as directed. Take first dose then second dose 6 hrs later then one daily till pain free.   escitalopram (LEXAPRO) 5 MG tablet Take 5 mg by mouth daily.   fexofenadine  (ALLEGRA ) 180 MG tablet Take 1 tablet (180 mg total) by mouth daily for 5 days.   fexofenadine  (ALLEGRA ) 180 MG tablet Take 1 tablet (180 mg total) by mouth daily.   glucose blood (ONETOUCH ULTRA TEST) test strip Use as instructed   guaiFENesin  200 MG tablet Take 1 tablet (200 mg total) by mouth every 4 (four) hours as needed for cough or to loosen phlegm.   Lancet Devices (ONETOUCH DELICA PLUS LANCING) MISC 100 Lancets by Does not apply route 2 (two) times daily.   methylPREDNISolone  (MEDROL  DOSEPAK) 4 MG TBPK tablet Use as directed   olmesartan  (BENICAR ) 5 MG tablet Take 5 mg by mouth daily.   Omega-3 Fatty Acids (FISH OIL) 300 MG CAPS Take by mouth.   ONETOUCH VERIO test strip twice a day   Spacer/Aero-Holding Chambers (AEROCHAMBER MV) inhaler Use as instructed   No facility-administered medications prior to visit.    Review of Systems  Respiratory:  Positive for cough, sputum production, shortness of breath and wheezing. Negative for hemoptysis.   Gastrointestinal:  Positive for diarrhea.  All other systems reviewed and are negative.      Objective:   BP (!) 140/80   Pulse 85   Temp (!) 101.3 F (38.5 C)   Ht 5' 8 (1.727 m)   Wt 163 lb 12.8 oz (74.3 kg)   SpO2 98%   BMI 24.91 kg/m   Vitals:   01/18/24 1358  BP: (!) 140/80  Pulse: 85  Temp: (!) 101.3 F (38.5 C)  Height: 5' 8 (1.727 m)  Weight: 163 lb 12.8 oz (74.3 kg)  SpO2: 98%  BMI (Calculated): 24.91    Physical Exam Vitals reviewed.  Constitutional:      General: She is not in acute  distress. HENT:     Head: Normocephalic.     Nose: Nose normal.     Mouth/Throat:     Mouth: Mucous membranes are moist.  Eyes:     Extraocular Movements: Extraocular movements intact.     Pupils:  Pupils are equal, round, and reactive to light.  Cardiovascular:     Rate and Rhythm: Normal rate and regular rhythm.     Heart sounds: No murmur heard. Pulmonary:     Effort: Pulmonary effort is normal.     Breath sounds: No rales.     Comments: Coughing spasm after deep inspiration for examination Abdominal:     General: Abdomen is flat.     Palpations: There is no hepatomegaly, splenomegaly or mass.     Tenderness: There is no abdominal tenderness.  Musculoskeletal:        General: Swelling present. Normal range of motion.     Cervical back: Normal range of motion. Spasms (left paraspinal muscle) present. No tenderness.     Right foot: Swelling (around 1st metatarsal) present.  Skin:    General: Skin is warm and dry.  Neurological:     General: No focal deficit present.     Mental Status: She is alert and oriented to person, place, and time.     Cranial Nerves: No cranial nerve deficit.     Motor: No weakness.  Psychiatric:        Mood and Affect: Mood normal.        Behavior: Behavior normal.      No results found for any visits on 01/18/24.      Assessment & Plan:   Problem List Items Addressed This Visit   None Visit Diagnoses       Acute bronchitis, unspecified organism    -  Primary   Relevant Medications   predniSONE  (DELTASONE ) 20 MG tablet   doxycycline  (VIBRA -TABS) 100 MG tablet   Other Relevant Orders   DG Chest 2 View       Return in about 1 week (around 01/25/2024) for acute bronchitis.   Total time spent: 30 minutes  Sherrill Cinderella Perry, MD  01/18/2024   This document may have been prepared by [facility] Voice Recognition software and as such may include unintentional dictation errors.

## 2024-01-19 NOTE — Progress Notes (Signed)
Informed by Mariann Laster

## 2024-01-27 DIAGNOSIS — K219 Gastro-esophageal reflux disease without esophagitis: Secondary | ICD-10-CM | POA: Diagnosis not present

## 2024-01-27 DIAGNOSIS — R059 Cough, unspecified: Secondary | ICD-10-CM | POA: Diagnosis not present

## 2024-02-16 ENCOUNTER — Other Ambulatory Visit

## 2024-02-16 DIAGNOSIS — L281 Prurigo nodularis: Secondary | ICD-10-CM | POA: Diagnosis not present

## 2024-02-16 DIAGNOSIS — L218 Other seborrheic dermatitis: Secondary | ICD-10-CM | POA: Diagnosis not present

## 2024-02-16 DIAGNOSIS — E7800 Pure hypercholesterolemia, unspecified: Secondary | ICD-10-CM | POA: Diagnosis not present

## 2024-02-16 DIAGNOSIS — R7303 Prediabetes: Secondary | ICD-10-CM | POA: Diagnosis not present

## 2024-02-16 DIAGNOSIS — Z79899 Other long term (current) drug therapy: Secondary | ICD-10-CM | POA: Diagnosis not present

## 2024-02-17 LAB — COMPREHENSIVE METABOLIC PANEL WITH GFR
ALT: 10 IU/L (ref 0–32)
AST: 18 IU/L (ref 0–40)
Albumin: 4 g/dL (ref 3.7–4.7)
Alkaline Phosphatase: 93 IU/L (ref 44–121)
BUN/Creatinine Ratio: 19 (ref 12–28)
BUN: 19 mg/dL (ref 8–27)
Bilirubin Total: 0.3 mg/dL (ref 0.0–1.2)
CO2: 25 mmol/L (ref 20–29)
Calcium: 9.4 mg/dL (ref 8.7–10.3)
Chloride: 107 mmol/L — ABNORMAL HIGH (ref 96–106)
Creatinine, Ser: 1.01 mg/dL — ABNORMAL HIGH (ref 0.57–1.00)
Globulin, Total: 2.4 g/dL (ref 1.5–4.5)
Glucose: 97 mg/dL (ref 70–99)
Potassium: 4.8 mmol/L (ref 3.5–5.2)
Sodium: 143 mmol/L (ref 134–144)
Total Protein: 6.4 g/dL (ref 6.0–8.5)
eGFR: 54 mL/min/1.73 — ABNORMAL LOW (ref >59)

## 2024-02-17 LAB — HEMOGLOBIN A1C
Est. average glucose Bld gHb Est-mCnc: 128 mg/dL
Hgb A1c MFr Bld: 6.1 % — ABNORMAL HIGH (ref 4.8–5.6)

## 2024-02-17 LAB — LIPID PANEL
Chol/HDL Ratio: 3 ratio (ref 0.0–4.4)
Cholesterol, Total: 200 mg/dL — ABNORMAL HIGH (ref 100–199)
HDL: 67 mg/dL (ref >39)
LDL Chol Calc (NIH): 119 mg/dL — ABNORMAL HIGH (ref 0–99)
Triglycerides: 78 mg/dL (ref 0–149)
VLDL Cholesterol Cal: 14 mg/dL (ref 5–40)

## 2024-02-23 ENCOUNTER — Ambulatory Visit: Admitting: Internal Medicine

## 2024-02-23 ENCOUNTER — Encounter: Payer: Self-pay | Admitting: Internal Medicine

## 2024-02-23 VITALS — BP 142/88 | HR 74 | Temp 97.9°F | Ht 68.0 in | Wt 166.6 lb

## 2024-02-23 DIAGNOSIS — N1831 Chronic kidney disease, stage 3a: Secondary | ICD-10-CM | POA: Diagnosis not present

## 2024-02-23 DIAGNOSIS — E7800 Pure hypercholesterolemia, unspecified: Secondary | ICD-10-CM

## 2024-02-23 DIAGNOSIS — I10 Essential (primary) hypertension: Secondary | ICD-10-CM | POA: Diagnosis not present

## 2024-02-23 DIAGNOSIS — R7303 Prediabetes: Secondary | ICD-10-CM | POA: Diagnosis not present

## 2024-02-23 DIAGNOSIS — M109 Gout, unspecified: Secondary | ICD-10-CM | POA: Diagnosis not present

## 2024-02-23 LAB — POCT CBG (FASTING - GLUCOSE)-MANUAL ENTRY: Glucose Fasting, POC: 101 mg/dL — AB (ref 70–99)

## 2024-02-23 MED ORDER — ALLOPURINOL 100 MG PO TABS: 100.0000 mg | ORAL_TABLET | Freq: Every day | ORAL | 0 refills | Status: AC

## 2024-02-23 NOTE — Progress Notes (Signed)
 Established Patient Office Visit  Subjective:  Patient ID: Diana Sullivan, female    DOB: 02/26/1936  Age: 88 y.o. MRN: [id number]  Chief Complaint  Patient presents with   Follow-up    3 month lab results    No new complaints, here for lab review and medication refills. Has not taken her antihypertensive this am. Labs reviewed and notable for  Improvement in A1c but deterioration in Total and LDL cholesterol to which she admits dietary indiscretion.    No other concerns at this time.   Past Medical History:  Diagnosis Date   Anginal pain (HCC)    chest pain occasionally, checked out [facility] (no Issues)   Arthritis    hands   Bronchitis    Complication of anesthesia    patient statedThey had trouble waking me up   Elevated lipids    Gout    Headache    once every 2 or 3 weeks   History of recent fall    shoulder and knee pain, left side   Hypertension    essential hypertension-controlled on meds   Mitral valve disorder    Muscular dystrophy (HCC)    Patient stated someone told her 25 years ago that she had the gene for it No issues   MVP (mitral valve prolapse)    Neuropathy    Pre-diabetes    watching diet   Wears partial dentures    upper and lower    Past Surgical History:  Procedure Laterality Date   ABDOMINAL HYSTERECTOMY     adenectomy     BACK SURGERY     BREAST SURGERY Right    biopsy, lumpectomy   BUNIONECTOMY Bilateral    CARDIAC CATHETERIZATION     no issue   CARPAL TUNNEL RELEASE Right    dr murrell   CATARACT EXTRACTION Left 12/06/2012   CATARACT EXTRACTION W/PHACO Right 11/14/2019   Procedure: CATARACT EXTRACTION PHACO AND INTRAOCULAR LENS PLACEMENT (IOC) RIGHT 4.76  00:44.4;  Surgeon: Myrna Adine Anes, MD;  Location: [facility];  Service: Ophthalmology;  Laterality: Right;   COLONOSCOPY N/A 04/21/2016   Procedure: COLONOSCOPY;  Surgeon: Lamar ONEIDA Holmes, MD;  Location: [facility] END[facility];  Service: Endoscopy;   Laterality: N/A;  Diabetic   COLONOSCOPY N/A 06/05/2021   Procedure: COLONOSCOPY;  Surgeon: Toledo, Ladell POUR, MD;  Location: [facility] ENDOSCOPY;  Service: Gastroenterology;  Laterality: N/A;   EYE SURGERY     metatarsal osteotomy Left 03/18/2013   TONSILLECTOMY     TOTAL KNEE ARTHROPLASTY Left 12/31/2021   Procedure: LEFT TOTAL KNEE ARTHROPLASTY;  Surgeon: Vernetta Lonni GRADE, MD;  Location: [facility] OR;  Service: Orthopedics;  Laterality: Left;   TYMPANOSTOMY TUBE PLACEMENT      Social History   Socioeconomic History   Marital status: Divorced    Spouse name: Not on file   Number of children: Not on file   Years of education: Not on file   Highest education level: Not on file  Occupational History   Not on file  Tobacco Use   Smoking status: Former    Current packs/day: 0.00    Average packs/day: 0.3 packs/day for 27.0 years (6.8 ttl pk-yrs)    Types: Cigarettes    Start date: 04/20/1967    Quit date: 04/19/1994    Years since quitting: 29.8   Smokeless tobacco: Never   Tobacco comments:    QUIT 25 YEARS AGO  Vaping Use   Vaping status: Never Used  Substance and  Sexual Activity   Alcohol use: Yes    Comment: SOCIAL   Drug use: Never   Sexual activity: Not on file  Other Topics Concern   Not on file  Social History Narrative   Not on file   Social Drivers of Health   Financial Resource Strain: Not on file  Food Insecurity: Not on file  Transportation Needs: Not on file  Physical Activity: Not on file  Stress: Not on file  Social Connections: Not on file  Intimate Partner Violence: Not on file    Family History  Problem Relation Age of Onset   Breast cancer Neg Hx    Neuropathy Neg Hx     Allergies  Allergen Reactions   Azithromycin Rash   Iodinated Contrast Media Itching and Swelling   Shellfish Allergy Itching and Swelling    Outpatient Medications Prior to Visit  Medication Sig   acetaminophen  (TYLENOL ) 500 MG tablet Take 500-1,000 mg by mouth every  6 (six) hours as needed for moderate pain.   albuterol  (VENTOLIN  HFA) 108 (90 Base) MCG/ACT inhaler Inhale 2 puffs into the lungs every 6 (six) hours as needed for wheezing or shortness of breath.   glucose blood (ONETOUCH ULTRA TEST) test strip Use as instructed   Lancet Devices (ONETOUCH DELICA PLUS LANCING) MISC 100 Lancets by Does not apply route 2 (two) times daily.   olmesartan  (BENICAR ) 5 MG tablet Take 5 mg by mouth daily.   ONETOUCH VERIO test strip twice a day   Spacer/Aero-Holding Chambers (AEROCHAMBER MV) inhaler Use as instructed   allopurinol  (ZYLOPRIM ) 100 MG tablet Take 1 tablet (100 mg total) by mouth daily. (Patient not taking: Reported on 02/23/2024)   colchicine  0.6 MG tablet Take 1 tablet (0.6 mg total) by mouth as directed. Take first dose then second dose 6 hrs later then one daily till pain free. (Patient not taking: Reported on 02/23/2024)   escitalopram (LEXAPRO) 5 MG tablet Take 5 mg by mouth daily. (Patient not taking: Reported on 02/23/2024)   fexofenadine  (ALLEGRA ) 180 MG tablet Take 1 tablet (180 mg total) by mouth daily for 5 days. (Patient not taking: Reported on 02/23/2024)   fexofenadine  (ALLEGRA ) 180 MG tablet Take 1 tablet (180 mg total) by mouth daily. (Patient not taking: Reported on 02/23/2024)   guaiFENesin  200 MG tablet Take 1 tablet (200 mg total) by mouth every 4 (four) hours as needed for cough or to loosen phlegm.   methylPREDNISolone  (MEDROL  DOSEPAK) 4 MG TBPK tablet Use as directed (Patient not taking: Reported on 02/23/2024)   Omega-3 Fatty Acids (FISH OIL) 300 MG CAPS Take by mouth. (Patient not taking: Reported on 02/23/2024)   No facility-administered medications prior to visit.    Review of Systems  Constitutional:  Positive for malaise/fatigue. Negative for weight loss (gained 3 lbs).  HENT: Negative.    Eyes: Negative.   Respiratory:  Negative for shortness of breath.   Cardiovascular: Negative.   Gastrointestinal: Negative.   Genitourinary:  Negative.   Musculoskeletal:  Positive for joint pain.  Skin: Negative.   Neurological: Negative.   Endo/Heme/Allergies: Negative.   Psychiatric/Behavioral:  Positive for depression. The patient is nervous/anxious.   All other systems reviewed and are negative.      Objective:   BP (!) 142/88   Pulse 74   Temp 97.9 F (36.6 C)   Ht 5' 8 (1.727 m)   Wt 166 lb 9.6 oz (75.6 kg)   SpO2 96%   BMI 25.33 kg/m  Vitals:   02/23/24 1050  BP: (!) 142/88  Pulse: 74  Temp: 97.9 F (36.6 C)  Height: 5' 8 (1.727 m)  Weight: 166 lb 9.6 oz (75.6 kg)  SpO2: 96%  BMI (Calculated): 25.34    Physical Exam Vitals reviewed.  Constitutional:      General: She is not in acute distress. HENT:     Head: Normocephalic.     Nose: Nose normal.     Mouth/Throat:     Mouth: Mucous membranes are moist.  Eyes:     Extraocular Movements: Extraocular movements intact.     Pupils: Pupils are equal, round, and reactive to light.  Cardiovascular:     Rate and Rhythm: Normal rate and regular rhythm.     Heart sounds: No murmur heard. Pulmonary:     Effort: Pulmonary effort is normal.     Breath sounds: Rhonchi present. No rales.     Comments: Coughing spasm after deep inspiration for examination Abdominal:     General: Abdomen is flat.     Palpations: There is no hepatomegaly, splenomegaly or mass.  Musculoskeletal:        General: Swelling present. Normal range of motion.     Cervical back: Normal range of motion. Spasms (left paraspinal muscle) present. No tenderness.     Right foot: Swelling (around 1st metatarsal) present.  Skin:    General: Skin is warm and dry.  Neurological:     General: No focal deficit present.     Mental Status: She is alert and oriented to person, place, and time.     Cranial Nerves: No cranial nerve deficit.     Motor: No weakness.  Psychiatric:        Mood and Affect: Mood normal.        Behavior: Behavior normal.      Results for orders placed or  performed in visit on 02/23/24  POCT CBG (Fasting - Glucose)  Result Value Ref Range   Glucose Fasting, POC 101 (A) 70 - 99 mg/dL   Lipid Panel     Component Value Date/Time   CHOL 200 (H) 02/16/2024 0843   TRIG 78 02/16/2024 0843   HDL 67 02/16/2024 0843   CHOLHDL 3.0 02/16/2024 0843   LDLCALC 119 (H) 02/16/2024 0843   LABVLDL 14 02/16/2024 0843    Lab Results  Component Value Date   HGBA1C 6.1 (H) 02/16/2024   HGBA1C 6.3 (H) 08/27/2023   HGBA1C 6.3 (H) 04/30/2023    CMP     Component Value Date/Time   NA 143 02/16/2024 0843   K 4.8 02/16/2024 0843   K 4.3 11/24/2012 1101   CL 107 (H) 02/16/2024 0843   CO2 25 02/16/2024 0843   GLUCOSE 97 02/16/2024 0843   GLUCOSE 100 (H) 10/31/2023 1641   BUN 19 02/16/2024 0843   CREATININE 1.01 (H) 02/16/2024 0843   CALCIUM 9.4 02/16/2024 0843   PROT 6.4 02/16/2024 0843   ALBUMIN 4.0 02/16/2024 0843   AST 18 02/16/2024 0843   ALT 10 02/16/2024 0843   ALKPHOS 93 02/16/2024 0843   BILITOT 0.3 02/16/2024 0843   EGFR 54 (L) 02/16/2024 0843   GFRNONAA >60 10/31/2023 1641        Assessment & Plan:  Guillermina was seen today for follow-up.  Type 2 diabetes mellitus without complication, without long-term current use of insulin (HCC) -     POCT CBG (Fasting - Glucose)    Problem List Items Addressed This Visit  None Visit Diagnoses       Type 2 diabetes mellitus without complication, without long-term current use of insulin (HCC)    -  Primary   Relevant Orders   POCT CBG (Fasting - Glucose) (Completed)       Return in about 15 weeks (around 06/07/2024) for awv with labs prior.   Total time spent: 20 minutes  Sherrill Cinderella Perry, MD  02/23/2024   This document may have been prepared by [facility] Voice Recognition software and as such may include unintentional dictation errors.

## 2024-03-03 DIAGNOSIS — Z789 Other specified health status: Secondary | ICD-10-CM | POA: Diagnosis not present

## 2024-03-03 DIAGNOSIS — E785 Hyperlipidemia, unspecified: Secondary | ICD-10-CM | POA: Diagnosis not present

## 2024-03-03 DIAGNOSIS — Z9889 Other specified postprocedural states: Secondary | ICD-10-CM | POA: Diagnosis not present

## 2024-03-03 DIAGNOSIS — I341 Nonrheumatic mitral (valve) prolapse: Secondary | ICD-10-CM | POA: Diagnosis not present

## 2024-03-03 DIAGNOSIS — R079 Chest pain, unspecified: Secondary | ICD-10-CM | POA: Diagnosis not present

## 2024-03-03 DIAGNOSIS — I10 Essential (primary) hypertension: Secondary | ICD-10-CM | POA: Diagnosis not present

## 2024-03-03 DIAGNOSIS — I340 Nonrheumatic mitral (valve) insufficiency: Secondary | ICD-10-CM | POA: Diagnosis not present

## 2024-03-04 DIAGNOSIS — H35342 Macular cyst, hole, or pseudohole, left eye: Secondary | ICD-10-CM | POA: Diagnosis not present

## 2024-03-04 DIAGNOSIS — H35373 Puckering of macula, bilateral: Secondary | ICD-10-CM | POA: Diagnosis not present

## 2024-03-04 DIAGNOSIS — Z961 Presence of intraocular lens: Secondary | ICD-10-CM | POA: Diagnosis not present

## 2024-03-04 DIAGNOSIS — E119 Type 2 diabetes mellitus without complications: Secondary | ICD-10-CM | POA: Diagnosis not present

## 2024-03-07 ENCOUNTER — Encounter: Payer: Self-pay | Admitting: Internal Medicine

## 2024-03-07 DIAGNOSIS — M1711 Unilateral primary osteoarthritis, right knee: Secondary | ICD-10-CM | POA: Diagnosis not present

## 2024-03-07 DIAGNOSIS — M7582 Other shoulder lesions, left shoulder: Secondary | ICD-10-CM | POA: Diagnosis not present

## 2024-03-07 DIAGNOSIS — M19012 Primary osteoarthritis, left shoulder: Secondary | ICD-10-CM | POA: Diagnosis not present

## 2024-03-10 ENCOUNTER — Telehealth: Payer: Self-pay | Admitting: Internal Medicine

## 2024-03-10 NOTE — Telephone Encounter (Signed)
 Pt came in and was stating that she needs to be on a different BP medicine, OLASARTAN is the one that she suggested. But then stated that she does not need to be on any of the SARTANS Pt stated she spoke to TJ about this at her last appt on 02/23/24 Good pharm is [facility] Pt also states that she took her last one this morning  Please advise

## 2024-03-22 DIAGNOSIS — J4489 Other specified chronic obstructive pulmonary disease: Secondary | ICD-10-CM | POA: Diagnosis not present

## 2024-03-22 DIAGNOSIS — G709 Myoneural disorder, unspecified: Secondary | ICD-10-CM | POA: Diagnosis not present

## 2024-03-22 DIAGNOSIS — I209 Angina pectoris, unspecified: Secondary | ICD-10-CM | POA: Diagnosis not present

## 2024-03-22 DIAGNOSIS — E785 Hyperlipidemia, unspecified: Secondary | ICD-10-CM | POA: Diagnosis not present

## 2024-03-22 DIAGNOSIS — F324 Major depressive disorder, single episode, in partial remission: Secondary | ICD-10-CM | POA: Diagnosis not present

## 2024-03-22 DIAGNOSIS — G7100 Muscular dystrophy, unspecified: Secondary | ICD-10-CM | POA: Diagnosis not present

## 2024-03-22 DIAGNOSIS — N1831 Chronic kidney disease, stage 3a: Secondary | ICD-10-CM | POA: Diagnosis not present

## 2024-03-22 DIAGNOSIS — M0550 Rheumatoid polyneuropathy with rheumatoid arthritis of unspecified site: Secondary | ICD-10-CM | POA: Diagnosis not present

## 2024-03-22 DIAGNOSIS — G959 Disease of spinal cord, unspecified: Secondary | ICD-10-CM | POA: Diagnosis not present

## 2024-03-28 ENCOUNTER — Ambulatory Visit: Admitting: Internal Medicine

## 2024-03-28 VITALS — BP 132/70 | HR 76 | Ht 68.0 in | Wt 165.6 lb

## 2024-03-28 DIAGNOSIS — E7800 Pure hypercholesterolemia, unspecified: Secondary | ICD-10-CM | POA: Diagnosis not present

## 2024-03-28 DIAGNOSIS — Z131 Encounter for screening for diabetes mellitus: Secondary | ICD-10-CM

## 2024-03-28 DIAGNOSIS — I10 Essential (primary) hypertension: Secondary | ICD-10-CM | POA: Diagnosis not present

## 2024-03-28 DIAGNOSIS — N1831 Chronic kidney disease, stage 3a: Secondary | ICD-10-CM | POA: Diagnosis not present

## 2024-03-28 DIAGNOSIS — M1710 Unilateral primary osteoarthritis, unspecified knee: Secondary | ICD-10-CM

## 2024-03-28 MED ORDER — DICLOFENAC SODIUM 1 % EX GEL: 4.0000 g | Freq: Two times a day (BID) | CUTANEOUS | 2 refills | Status: DC

## 2024-03-28 MED ORDER — OLMESARTAN MEDOXOMIL-HCTZ 20-12.5 MG PO TABS
1.0000 | ORAL_TABLET | Freq: Every day | ORAL | 1 refills | Status: AC
Start: 2024-04-01 — End: 2024-09-28

## 2024-03-28 NOTE — Progress Notes (Signed)
 Established Patient Office Visit  Subjective:  Patient ID: Diana Sullivan, female    DOB: 04-04-1936  Age: 88 y.o. MRN: [id number]  Chief Complaint  Patient presents with   Follow-up    Discuss meds    Here to discuss ARB. Explained that ARB'Tayvon Culley do not cause ACE-I cough and that she should resume it.  Also c/o deteriorating pain in her left knee, of note diagnosed with severe arthritis but has declined surgery.    No other concerns at this time.   Past Medical History:  Diagnosis Date   Anginal pain    chest pain occasionally, checked out [facility] (no Issues)   Arthritis    hands   Bronchitis    Complication of anesthesia    patient statedThey had trouble waking me up   Elevated lipids    Gout    Headache    once every 2 or 3 weeks   History of recent fall    shoulder and knee pain, left side   Hypertension    essential hypertension-controlled on meds   Mitral valve disorder    Muscular dystrophy (HCC)    Patient stated someone told her 25 years ago that she had the gene for it No issues   MVP (mitral valve prolapse)    Neuropathy    Pre-diabetes    watching diet   Wears partial dentures    upper and lower    Past Surgical History:  Procedure Laterality Date   ABDOMINAL HYSTERECTOMY     adenectomy     BACK SURGERY     BREAST SURGERY Right    biopsy, lumpectomy   BUNIONECTOMY Bilateral    CARDIAC CATHETERIZATION     no issue   CARPAL TUNNEL RELEASE Right    dr murrell   CATARACT EXTRACTION Left 12/06/2012   CATARACT EXTRACTION W/PHACO Right 11/14/2019   Procedure: CATARACT EXTRACTION PHACO AND INTRAOCULAR LENS PLACEMENT (IOC) RIGHT 4.76  00:44.4;  Surgeon: Myrna Adine Anes, MD;  Location: [facility];  Service: Ophthalmology;  Laterality: Right;   COLONOSCOPY N/A 04/21/2016   Procedure: COLONOSCOPY;  Surgeon: Lamar ONEIDA Holmes, MD;  Location: [facility] END[facility]COPY;  Service: Endoscopy;  Laterality: N/A;  Diabetic   COLONOSCOPY N/A  06/05/2021   Procedure: COLONOSCOPY;  Surgeon: Toledo, Ladell POUR, MD;  Location: [facility] ENDOSCOPY;  Service: Gastroenterology;  Laterality: N/A;   EYE SURGERY     metatarsal osteotomy Left 03/18/2013   TONSILLECTOMY     TOTAL KNEE ARTHROPLASTY Left 12/31/2021   Procedure: LEFT TOTAL KNEE ARTHROPLASTY;  Surgeon: Vernetta Lonni GRADE, MD;  Location: [facility] OR;  Service: Orthopedics;  Laterality: Left;   TYMPANOSTOMY TUBE PLACEMENT      Social History   Socioeconomic History   Marital status: Divorced    Spouse name: Not on file   Number of children: Not on file   Years of education: Not on file   Highest education level: Not on file  Occupational History   Not on file  Tobacco Use   Smoking status: Former    Current packs/day: 0.00    Average packs/day: 0.3 packs/day for 27.0 years (6.8 ttl pk-yrs)    Types: Cigarettes    Start date: 04/20/1967    Quit date: 04/19/1994    Years since quitting: 29.9   Smokeless tobacco: Never   Tobacco comments:    QUIT 25 YEARS AGO  Vaping Use   Vaping status: Never Used  Substance and Sexual Activity   Alcohol  use: Yes    Comment: SOCIAL   Drug use: Never   Sexual activity: Not on file  Other Topics Concern   Not on file  Social History Narrative   Not on file   Social Drivers of Health   Financial Resource Strain: Low Risk  (03/07/2024)   Received from [facility]   Overall Financial Resource Strain (CARDIA)    Difficulty of Paying Living Expenses: Not very hard  Food Insecurity: No Food Insecurity (03/07/2024)   Received from [facility]   Hunger Vital Sign    Within the past 12 months, you worried that your food would run out before you got the money to buy more.: Never true    Within the past 12 months, the food you bought just didn't last and you didn't have money to get more.: Never true  Transportation Needs: No Transportation Needs (03/07/2024)   Received from [facility] - Transportation    In the past 12 months, has lack of transportation kept you from medical appointments or from getting medications?: No    Lack of Transportation (Non-Medical): No  Physical Activity: Not on file  Stress: Not on file  Social Connections: Not on file  Intimate Partner Violence: Not on file    Family History  Problem Relation Age of Onset   Breast cancer Neg Hx    Neuropathy Neg Hx     Allergies  Allergen Reactions   Azithromycin Rash   Iodinated Contrast Media Itching and Swelling   Shellfish Allergy Itching and Swelling    Outpatient Medications Prior to Visit  Medication Sig   acetaminophen  (TYLENOL ) 500 MG tablet Take 500-1,000 mg by mouth every 6 (six) hours as needed for moderate pain.   albuterol  (VENTOLIN  HFA) 108 (90 Base) MCG/ACT inhaler Inhale 2 puffs into the lungs every 6 (six) hours as needed for wheezing or shortness of breath.   allopurinol  (ZYLOPRIM ) 100 MG tablet Take 1 tablet (100 mg total) by mouth daily.   colchicine  0.6 MG tablet Take 1 tablet (0.6 mg total) by mouth as directed. Take first dose then second dose 6 hrs later then one daily till pain free. (Patient not taking: Reported on 02/23/2024)   escitalopram (LEXAPRO) 5 MG tablet Take 5 mg by mouth daily. (Patient not taking: Reported on 02/23/2024)   fexofenadine  (ALLEGRA ) 180 MG tablet Take 1 tablet (180 mg total) by mouth daily for 5 days. (Patient not taking: Reported on 02/23/2024)   fexofenadine  (ALLEGRA ) 180 MG tablet Take 1 tablet (180 mg total) by mouth daily. (Patient not taking: Reported on 02/23/2024)   glucose blood (ONETOUCH ULTRA TEST) test strip Use as instructed   guaiFENesin  200 MG tablet Take 1 tablet (200 mg total) by mouth every 4 (four) hours as needed for cough or to loosen phlegm.   Lancet Devices (ONETOUCH DELICA PLUS LANCING) MISC 100 Lancets by Does not apply route 2 (two) times daily.   Omega-3 Fatty Acids (FISH OIL) 300 MG CAPS Take by mouth. (Patient not  taking: Reported on 02/23/2024)   ONETOUCH VERIO test strip twice a day   Spacer/Aero-Holding Chambers (AEROCHAMBER MV) inhaler Use as instructed   [DISCONTINUED] olmesartan  (BENICAR ) 5 MG tablet Take 5 mg by mouth daily.   No facility-administered medications prior to visit.    Review of Systems  Constitutional:  Positive for weight loss (1 lb).  HENT: Negative.    Eyes: Negative.   Respiratory:  Negative  for shortness of breath.   Cardiovascular: Negative.   Gastrointestinal: Negative.   Genitourinary: Negative.   Musculoskeletal:  Positive for joint pain.  Skin: Negative.   Neurological: Negative.   Endo/Heme/Allergies: Negative.   Psychiatric/Behavioral:  Positive for depression. The patient is nervous/anxious.   All other systems reviewed and are negative.      Objective:   BP 132/70   Pulse 76   Ht 5' 8 (1.727 m)   Wt 165 lb 9.6 oz (75.1 kg)   SpO2 96%   BMI 25.18 kg/m   Vitals:   03/28/24 1442  BP: 132/70  Pulse: 76  Height: 5' 8 (1.727 m)  Weight: 165 lb 9.6 oz (75.1 kg)  SpO2: 96%  BMI (Calculated): 25.19    Physical Exam Vitals reviewed.  Constitutional:      General: She is not in acute distress. HENT:     Head: Normocephalic.     Nose: Nose normal.     Mouth/Throat:     Mouth: Mucous membranes are moist.  Eyes:     Extraocular Movements: Extraocular movements intact.     Pupils: Pupils are equal, round, and reactive to light.  Cardiovascular:     Rate and Rhythm: Normal rate and regular rhythm.     Heart sounds: No murmur heard. Pulmonary:     Effort: Pulmonary effort is normal.     Breath sounds: Rhonchi present. No rales.     Comments: Coughing spasm after deep inspiration for examination Abdominal:     General: Abdomen is flat.     Palpations: There is no hepatomegaly, splenomegaly or mass.  Musculoskeletal:        General: Swelling present. Normal range of motion.     Cervical back: Normal range of motion. Spasms (left paraspinal  muscle) present. No tenderness.     Right foot: Swelling (around 1st metatarsal) present.  Skin:    General: Skin is warm and dry.  Neurological:     General: No focal deficit present.     Mental Status: She is alert and oriented to person, place, and time.     Cranial Nerves: No cranial nerve deficit.     Motor: No weakness.  Psychiatric:        Mood and Affect: Mood normal.        Behavior: Behavior normal.      No results found for any visits on 03/28/24.      Assessment & Plan:  Tilla was seen today for follow-up.  Benign essential hypertension -     Pneumococcal conjugate vaccine 20-valent -     Olmesartan  Medoxomil-HCTZ; Take 1 tablet by mouth daily.  Dispense: 90 tablet; Refill: 1  Stage 3a chronic kidney disease (HCC)  Pure hypercholesterolemia  Arthritis of knee -     Diclofenac Sodium; Apply 4 g topically in the morning and at bedtime.  Dispense: 240 g; Refill: 2    Problem List Items Addressed This Visit       Cardiovascular and Mediastinum   Benign essential hypertension - Primary   Relevant Medications   olmesartan -hydrochlorothiazide  (BENICAR  HCT) 20-12.5 MG tablet (Start on 04/01/2024)   Other Relevant Orders   Pneumococcal conjugate vaccine 20-valent (Prevnar-20)     Genitourinary   Stage 3a chronic kidney disease (HCC)     Other   Hyperlipidemia, unspecified   Relevant Medications   olmesartan -hydrochlorothiazide  (BENICAR  HCT) 20-12.5 MG tablet (Start on 04/01/2024)   Other Visit Diagnoses       Arthritis of  knee       Relevant Medications   diclofenac Sodium (VOLTAREN) 1 % GEL       Return if symptoms worsen or fail to improve.   Total time spent: 30 minutes. This time includes review of previous notes and results and patient face to face interaction during today'Ante Arredondo visit.    Sherrill Cinderella Perry, MD  03/28/2024   This document may have been prepared by [facility] Voice Recognition software and as such may include unintentional  dictation errors.

## 2024-03-30 DIAGNOSIS — L718 Other rosacea: Secondary | ICD-10-CM | POA: Diagnosis not present

## 2024-03-30 DIAGNOSIS — Z79899 Other long term (current) drug therapy: Secondary | ICD-10-CM | POA: Diagnosis not present

## 2024-03-30 DIAGNOSIS — L281 Prurigo nodularis: Secondary | ICD-10-CM | POA: Diagnosis not present

## 2024-04-13 ENCOUNTER — Other Ambulatory Visit: Payer: Self-pay | Admitting: Internal Medicine

## 2024-04-13 DIAGNOSIS — Z1231 Encounter for screening mammogram for malignant neoplasm of breast: Secondary | ICD-10-CM

## 2024-05-02 DIAGNOSIS — R058 Other specified cough: Secondary | ICD-10-CM | POA: Diagnosis not present

## 2024-05-02 DIAGNOSIS — D1722 Benign lipomatous neoplasm of skin and subcutaneous tissue of left arm: Secondary | ICD-10-CM | POA: Diagnosis not present

## 2024-05-19 ENCOUNTER — Encounter

## 2024-06-07 ENCOUNTER — Ambulatory Visit (INDEPENDENT_AMBULATORY_CARE_PROVIDER_SITE_OTHER): Admitting: Internal Medicine

## 2024-06-07 ENCOUNTER — Ambulatory Visit
Admission: RE | Admit: 2024-06-07 | Discharge: 2024-06-07 | Disposition: A | Discharge status: Home / Self Care | Source: Ambulatory Visit | Attending: Internal Medicine | Admitting: Internal Medicine

## 2024-06-07 VITALS — BP 127/88 | HR 73 | Temp 98.6°F | Ht 68.0 in | Wt 163.8 lb

## 2024-06-07 DIAGNOSIS — F331 Major depressive disorder, recurrent, moderate: Secondary | ICD-10-CM

## 2024-06-07 DIAGNOSIS — R7303 Prediabetes: Secondary | ICD-10-CM

## 2024-06-07 DIAGNOSIS — Z0001 Encounter for general adult medical examination with abnormal findings: Secondary | ICD-10-CM | POA: Diagnosis not present

## 2024-06-07 DIAGNOSIS — Z1231 Encounter for screening mammogram for malignant neoplasm of breast: Secondary | ICD-10-CM | POA: Diagnosis present

## 2024-06-07 DIAGNOSIS — F419 Anxiety disorder, unspecified: Secondary | ICD-10-CM | POA: Diagnosis not present

## 2024-06-07 DIAGNOSIS — M1710 Unilateral primary osteoarthritis, unspecified knee: Secondary | ICD-10-CM | POA: Diagnosis not present

## 2024-06-07 DIAGNOSIS — Z1331 Encounter for screening for depression: Secondary | ICD-10-CM

## 2024-06-07 DIAGNOSIS — M62830 Muscle spasm of back: Secondary | ICD-10-CM | POA: Diagnosis not present

## 2024-06-07 LAB — POCT CBG (FASTING - GLUCOSE)-MANUAL ENTRY: Glucose Fasting, POC: 102 mg/dL — AB (ref 70–99)

## 2024-06-07 MED ORDER — ESCITALOPRAM OXALATE 5 MG PO TABS: 5.0000 mg | ORAL_TABLET | Freq: Every day | ORAL | 1 refills | Status: AC

## 2024-06-07 MED ORDER — DICLOFENAC SODIUM 1 % EX GEL: 4.0000 g | Freq: Two times a day (BID) | CUTANEOUS | 2 refills | Status: AC

## 2024-06-07 NOTE — Progress Notes (Signed)
 "  Established Patient Office Visit  Subjective:  Patient ID: Diana Sullivan, female    DOB: 1936-04-08  Age: 89 y.o. MRN: [id number]  Chief Complaint  Patient presents with   Annual Exam    AWV lab results     No new complaints, here for AWV refer to quality metrics and scanned documents. States she wants to lose weight but her BMI is normal.    No other concerns at this time.   Past Medical History:  Diagnosis Date   Anginal pain    chest pain occasionally, checked out [facility] (no Issues)   Arthritis    hands   Bronchitis    Complication of anesthesia    patient statedThey had trouble waking me up   Elevated lipids    Gout    Headache    once every 2 or 3 weeks   History of recent fall    shoulder and knee pain, left side   Hypertension    essential hypertension-controlled on meds   Mitral valve disorder    Muscular dystrophy (HCC)    Patient stated someone told her 25 years ago that she had the gene for it No issues   MVP (mitral valve prolapse)    Neuropathy    Pre-diabetes    watching diet   Wears partial dentures    upper and lower    Past Surgical History:  Procedure Laterality Date   ABDOMINAL HYSTERECTOMY     adenectomy     BACK SURGERY     BREAST SURGERY Right    biopsy, lumpectomy   BUNIONECTOMY Bilateral    CARDIAC CATHETERIZATION     no issue   CARPAL TUNNEL RELEASE Right    dr murrell   CATARACT EXTRACTION Left 12/06/2012   CATARACT EXTRACTION W/PHACO Right 11/14/2019   Procedure: CATARACT EXTRACTION PHACO AND INTRAOCULAR LENS PLACEMENT (IOC) RIGHT 4.76  00:44.4;  Surgeon: Myrna Adine Anes, MD;  Location: [facility];  Service: Ophthalmology;  Laterality: Right;   COLONOSCOPY N/A 04/21/2016   Procedure: COLONOSCOPY;  Surgeon: Lamar ONEIDA Holmes, MD;  Location: [facility];  Service: Endoscopy;  Laterality: N/A;  Diabetic   COLONOSCOPY N/A 06/05/2021   Procedure: COLONOSCOPY;  Surgeon: Toledo, Ladell POUR, MD;   Location: [facility] ENDOSCOPY;  Service: Gastroenterology;  Laterality: N/A;   EYE SURGERY     metatarsal osteotomy Left 03/18/2013   TONSILLECTOMY     TOTAL KNEE ARTHROPLASTY Left 12/31/2021   Procedure: LEFT TOTAL KNEE ARTHROPLASTY;  Surgeon: Vernetta Lonni GRADE, MD;  Location: [facility] OR;  Service: Orthopedics;  Laterality: Left;   TYMPANOSTOMY TUBE PLACEMENT      Social History   Socioeconomic History   Marital status: Divorced    Spouse name: Not on file   Number of children: Not on file   Years of education: Not on file   Highest education level: Not on file  Occupational History   Not on file  Tobacco Use   Smoking status: Former    Current packs/day: 0.00    Average packs/day: 0.3 packs/day for 27.0 years (6.8 ttl pk-yrs)    Types: Cigarettes    Start date: 04/20/1967    Quit date: 04/19/1994    Years since quitting: 30.1   Smokeless tobacco: Never   Tobacco comments:    QUIT 25 YEARS AGO  Vaping Use   Vaping status: Never Used  Substance and Sexual Activity   Alcohol use: Yes    Comment: SOCIAL  Drug use: Never   Sexual activity: Not on file  Other Topics Concern   Not on file  Social History Narrative   Not on file   Social Drivers of Health   Tobacco Use: Medium Risk (05/02/2024)   Received from [facility]   Patient History    Smoking Tobacco Use: Former    Smokeless Tobacco Use: Never    Passive Exposure: Not on file  Financial Resource Strain: Low Risk  (03/07/2024)   Received from [facility]   Overall Financial Resource Strain (CARDIA)    Difficulty of Paying Living Expenses: Not very hard  Food Insecurity: No Food Insecurity (03/07/2024)   Received from [facility]   Epic    Within the past 12 months, you worried that your food would run out before you got the money to buy more.: Never true    Within the past 12 months, the food you bought just didn't last and you didn't have money to get more.:  Never true  Transportation Needs: No Transportation Needs (03/07/2024)   Received from [facility] - Transportation    In the past 12 months, has lack of transportation kept you from medical appointments or from getting medications?: No    Lack of Transportation (Non-Medical): No  Physical Activity: Not on file  Stress: Not on file  Social Connections: Not on file  Intimate Partner Violence: Not on file  Depression (PHQ2-9): Medium Risk (06/04/2023)   Depression (PHQ2-9)    PHQ-2 Score: 10  Alcohol Screen: Not on file  Housing: Low Risk  (03/07/2024)   Received from [facility]   Epic    In the last 12 months, was there a time when you were not able to pay the mortgage or rent on time?: No    In the past 12 months, how many times have you moved where you were living?: 0    At any time in the past 12 months, were you homeless or living in a shelter (including now)?: No  Utilities: Not At Risk (03/07/2024)   Received from [facility]   Epic    In the past 12 months has the electric, gas, oil, or water company threatened to shut off services in your home?: No  Health Literacy: Not on file    Family History  Problem Relation Age of Onset   Breast cancer Neg Hx    Neuropathy Neg Hx     Allergies[1]  Show/hide medication list[2]  Review of Systems  Constitutional:  Positive for weight loss (3 lbs over 90 days).  HENT: Negative.    Eyes: Negative.   Respiratory:  Negative for shortness of breath.   Cardiovascular: Negative.   Gastrointestinal: Negative.   Genitourinary: Negative.   Musculoskeletal:  Positive for joint pain.  Skin: Negative.   Neurological: Negative.   Endo/Heme/Allergies: Negative.   Psychiatric/Behavioral:  Positive for depression. The patient is nervous/anxious.   All other systems reviewed and are negative.      Objective:   BP 127/88   Pulse 73   Temp 98.6 F (37 C) (Tympanic)   Ht  5' 8 (1.727 m)   Wt 163 lb 12.8 oz (74.3 kg)   SpO2 95%   BMI 24.91 kg/m   Vitals:   06/07/24 1024  BP: 127/88  Pulse: 73  Temp: 98.6 F (37 C)  Height: 5' 8 (1.727 m)  Weight: 163 lb  12.8 oz (74.3 kg)  SpO2: 95%  TempSrc: Tympanic  BMI (Calculated): 24.91    Physical Exam Vitals reviewed.  Constitutional:      General: She is not in acute distress. HENT:     Head: Normocephalic.     Nose: Nose normal.     Mouth/Throat:     Mouth: Mucous membranes are moist.  Eyes:     Extraocular Movements: Extraocular movements intact.     Pupils: Pupils are equal, round, and reactive to light.  Cardiovascular:     Rate and Rhythm: Normal rate and regular rhythm.     Heart sounds: No murmur heard. Pulmonary:     Effort: Pulmonary effort is normal.     Breath sounds: Rhonchi present. No rales.     Comments: Coughing spasm after deep inspiration for examination Abdominal:     General: Abdomen is flat.     Palpations: There is no hepatomegaly, splenomegaly or mass.  Musculoskeletal:        General: Swelling present. Normal range of motion.     Cervical back: Normal range of motion. Spasms (left paraspinal muscle) present. No tenderness.     Right foot: Swelling (around 1st metatarsal) present.  Skin:    General: Skin is warm and dry.  Neurological:     General: No focal deficit present.     Mental Status: She is alert and oriented to person, place, and time.     Cranial Nerves: No cranial nerve deficit.     Motor: No weakness.  Psychiatric:        Mood and Affect: Mood normal.        Behavior: Behavior normal.      Results for orders placed or performed in visit on 06/07/24  POCT CBG (Fasting - Glucose)  Result Value Ref Range   Glucose Fasting, POC 102 (A) 70 - 99 mg/dL    Recent Results (from the past 2160 hours)  POCT CBG (Fasting - Glucose)     Status: Abnormal   Collection Time: 06/07/24 10:37 AM  Result Value Ref Range   Glucose Fasting, POC 102 (A) 70 - 99  mg/dL      Assessment & Plan:  Peta was seen today for annual exam.  Prediabetes -     POCT CBG (Fasting - Glucose)  Arthritis of knee -     Diclofenac  Sodium; Apply 4 g topically in the morning and at bedtime.  Dispense: 240 g; Refill: 2    Problem List Items Addressed This Visit       Other   Prediabetes - Primary   Relevant Orders   POCT CBG (Fasting - Glucose) (Completed)   Other Visit Diagnoses       Arthritis of knee       Relevant Medications   diclofenac  Sodium (VOLTAREN ) 1 % GEL       Return in about 3 months (around 09/05/2024) for fu with labs prior.   Total time spent: 30 minutes. This time includes review of previous notes and results and patient face to face interaction during today'Johnte Portnoy visit.    Sherrill Cinderella Perry, MD  06/07/2024   This document may have been prepared by [facility] Voice Recognition software and as such may include unintentional dictation errors.     [1]  Allergies Allergen Reactions   Azithromycin Rash   Iodinated Contrast Media Itching and Swelling   Shellfish Allergy Itching and Swelling  [2]  Outpatient Medications Prior to Visit  Medication Sig  acetaminophen  (TYLENOL ) 500 MG tablet Take 500-1,000 mg by mouth every 6 (six) hours as needed for moderate pain.   allopurinol  (ZYLOPRIM ) 100 MG tablet Take 1 tablet (100 mg total) by mouth daily.   amoxicillin-clavulanate (AUGMENTIN) 875-125 MG tablet Take 1 tablet by mouth 2 (two) times daily.   glucose blood (ONETOUCH ULTRA TEST) test strip Use as instructed   guaiFENesin  200 MG tablet Take 1 tablet (200 mg total) by mouth every 4 (four) hours as needed for cough or to loosen phlegm.   Lancet Devices (ONETOUCH DELICA PLUS LANCING) MISC 100 Lancets by Does not apply route 2 (two) times daily.   olmesartan -hydrochlorothiazide  (BENICAR  HCT) 20-12.5 MG tablet Take 1 tablet by mouth daily.   TRELEGY ELLIPTA 100-62.5-25 MCG/ACT AEPB Inhale 1 puff into the lungs.   [DISCONTINUED]  diclofenac  Sodium (VOLTAREN ) 1 % GEL Apply 4 g topically in the morning and at bedtime.   albuterol  (VENTOLIN  HFA) 108 (90 Base) MCG/ACT inhaler Inhale 2 puffs into the lungs every 6 (six) hours as needed for wheezing or shortness of breath. (Patient not taking: Reported on 06/07/2024)   colchicine  0.6 MG tablet Take 1 tablet (0.6 mg total) by mouth as directed. Take first dose then second dose 6 hrs later then one daily till pain free. (Patient not taking: Reported on 02/23/2024)   escitalopram (LEXAPRO) 5 MG tablet Take 5 mg by mouth daily. (Patient not taking: Reported on 02/23/2024)   fexofenadine  (ALLEGRA ) 180 MG tablet Take 1 tablet (180 mg total) by mouth daily for 5 days. (Patient not taking: Reported on 02/23/2024)   fexofenadine  (ALLEGRA ) 180 MG tablet Take 1 tablet (180 mg total) by mouth daily. (Patient not taking: Reported on 02/23/2024)   Omega-3 Fatty Acids (FISH OIL) 300 MG CAPS Take by mouth. (Patient not taking: Reported on 02/23/2024)   ONETOUCH VERIO test strip twice a day (Patient not taking: Reported on 06/07/2024)   Spacer/Aero-Holding Chambers (AEROCHAMBER MV) inhaler Use as instructed (Patient not taking: Reported on 06/07/2024)   No facility-administered medications prior to visit.   "

## 2024-06-08 LAB — LIPID PANEL
Chol/HDL Ratio: 2.5 ratio (ref 0.0–4.4)
Cholesterol, Total: 180 mg/dL (ref 100–199)
HDL: 72 mg/dL
LDL Chol Calc (NIH): 93 mg/dL (ref 0–99)
Triglycerides: 82 mg/dL (ref 0–149)
VLDL Cholesterol Cal: 15 mg/dL (ref 5–40)

## 2024-06-08 LAB — COMPREHENSIVE METABOLIC PANEL WITH GFR
ALT: 13 IU/L (ref 0–32)
AST: 20 IU/L (ref 0–40)
Albumin: 4.1 g/dL (ref 3.7–4.7)
Alkaline Phosphatase: 87 IU/L (ref 48–129)
BUN/Creatinine Ratio: 23 (ref 12–28)
BUN: 20 mg/dL (ref 8–27)
Bilirubin Total: 0.4 mg/dL (ref 0.0–1.2)
CO2: 24 mmol/L (ref 20–29)
Calcium: 9.6 mg/dL (ref 8.7–10.3)
Chloride: 104 mmol/L (ref 96–106)
Creatinine, Ser: 0.88 mg/dL (ref 0.57–1.00)
Globulin, Total: 2 g/dL (ref 1.5–4.5)
Glucose: 90 mg/dL (ref 70–99)
Potassium: 4.5 mmol/L (ref 3.5–5.2)
Sodium: 140 mmol/L (ref 134–144)
Total Protein: 6.1 g/dL (ref 6.0–8.5)
eGFR: 64 mL/min/1.73

## 2024-06-08 LAB — CBC WITH DIFF/PLATELET
Basophils Absolute: 0.1 x10E3/uL (ref 0.0–0.2)
Basos: 1 %
EOS (ABSOLUTE): 0.2 x10E3/uL (ref 0.0–0.4)
Eos: 2 %
Hematocrit: 42.9 % (ref 34.0–46.6)
Hemoglobin: 13.9 g/dL (ref 11.1–15.9)
Immature Grans (Abs): 0 x10E3/uL (ref 0.0–0.1)
Immature Granulocytes: 0 %
Lymphocytes Absolute: 2 x10E3/uL (ref 0.7–3.1)
Lymphs: 23 %
MCH: 30 pg (ref 26.6–33.0)
MCHC: 32.4 g/dL (ref 31.5–35.7)
MCV: 93 fL (ref 79–97)
Monocytes Absolute: 0.7 x10E3/uL (ref 0.1–0.9)
Monocytes: 8 %
Neutrophils Absolute: 5.9 x10E3/uL (ref 1.4–7.0)
Neutrophils: 66 %
Platelets: 338 x10E3/uL (ref 150–450)
RBC: 4.64 x10E6/uL (ref 3.77–5.28)
RDW: 13.6 % (ref 11.7–15.4)
WBC: 8.9 x10E3/uL (ref 3.4–10.8)

## 2024-06-08 LAB — HEMOGLOBIN A1C
Est. average glucose Bld gHb Est-mCnc: 126 mg/dL
Hgb A1c MFr Bld: 6 % — ABNORMAL HIGH (ref 4.8–5.6)

## 2024-06-08 LAB — URIC ACID: Uric Acid: 7.2 mg/dL (ref 3.1–7.9)

## 2024-07-04 ENCOUNTER — Ambulatory Visit: Admitting: Internal Medicine

## 2024-07-08 ENCOUNTER — Ambulatory Visit: Admitting: Internal Medicine

## 2024-07-22 ENCOUNTER — Ambulatory Visit: Admitting: Internal Medicine
